# Patient Record
Sex: Female | Born: 1935 | Race: White | Hispanic: No | Smoking: Never smoker
Health system: Southern US, Community
[De-identification: ages and names within clinical notes are randomized; demographics above are authoritative.]

## PROBLEM LIST (undated history)

## (undated) DIAGNOSIS — G43909 Migraine, unspecified, not intractable, without status migrainosus: Secondary | ICD-10-CM

## (undated) DIAGNOSIS — R112 Nausea with vomiting, unspecified: Secondary | ICD-10-CM

## (undated) DIAGNOSIS — T4145XA Adverse effect of unspecified anesthetic, initial encounter: Secondary | ICD-10-CM

## (undated) DIAGNOSIS — M1990 Unspecified osteoarthritis, unspecified site: Secondary | ICD-10-CM

## (undated) DIAGNOSIS — Z87442 Personal history of urinary calculi: Secondary | ICD-10-CM

## (undated) DIAGNOSIS — E7800 Pure hypercholesterolemia, unspecified: Secondary | ICD-10-CM

## (undated) DIAGNOSIS — I10 Essential (primary) hypertension: Secondary | ICD-10-CM

## (undated) DIAGNOSIS — I2510 Atherosclerotic heart disease of native coronary artery without angina pectoris: Secondary | ICD-10-CM

## (undated) DIAGNOSIS — R011 Cardiac murmur, unspecified: Secondary | ICD-10-CM

## (undated) DIAGNOSIS — T8859XA Other complications of anesthesia, initial encounter: Secondary | ICD-10-CM

## (undated) DIAGNOSIS — E785 Hyperlipidemia, unspecified: Secondary | ICD-10-CM

## (undated) DIAGNOSIS — I350 Nonrheumatic aortic (valve) stenosis: Secondary | ICD-10-CM

## (undated) DIAGNOSIS — Z800 Family history of malignant neoplasm of digestive organs: Secondary | ICD-10-CM

## (undated) DIAGNOSIS — Z9889 Other specified postprocedural states: Secondary | ICD-10-CM

## (undated) DIAGNOSIS — Z952 Presence of prosthetic heart valve: Secondary | ICD-10-CM

## (undated) DIAGNOSIS — I480 Paroxysmal atrial fibrillation: Secondary | ICD-10-CM

## (undated) HISTORY — DX: Hyperlipidemia, unspecified: E78.5

## (undated) HISTORY — DX: Family history of malignant neoplasm of digestive organs: Z80.0

## (undated) HISTORY — PX: VARICOSE VEIN SURGERY: SHX832

## (undated) HISTORY — PX: CORONARY ANGIOPLASTY WITH STENT PLACEMENT: SHX49

## (undated) HISTORY — PX: EYE SURGERY: SHX253

---

## 1998-09-23 ENCOUNTER — Ambulatory Visit (HOSPITAL_COMMUNITY): Admission: RE | Admit: 1998-09-23 | Discharge: 1998-09-23 | Payer: Self-pay | Admitting: Internal Medicine

## 1998-09-23 ENCOUNTER — Encounter: Payer: Self-pay | Admitting: Internal Medicine

## 1998-11-08 ENCOUNTER — Other Ambulatory Visit: Admission: RE | Admit: 1998-11-08 | Discharge: 1998-11-08 | Payer: Self-pay | Admitting: Internal Medicine

## 2000-01-30 ENCOUNTER — Other Ambulatory Visit: Admission: RE | Admit: 2000-01-30 | Discharge: 2000-01-30 | Payer: Self-pay | Admitting: Internal Medicine

## 2000-03-02 ENCOUNTER — Ambulatory Visit (HOSPITAL_COMMUNITY): Admission: RE | Admit: 2000-03-02 | Discharge: 2000-03-02 | Payer: Self-pay | Admitting: *Deleted

## 2004-09-15 ENCOUNTER — Encounter (INDEPENDENT_AMBULATORY_CARE_PROVIDER_SITE_OTHER): Payer: Self-pay | Admitting: Specialist

## 2004-09-15 ENCOUNTER — Encounter (INDEPENDENT_AMBULATORY_CARE_PROVIDER_SITE_OTHER): Payer: Self-pay | Admitting: *Deleted

## 2004-09-15 ENCOUNTER — Ambulatory Visit (HOSPITAL_COMMUNITY): Admission: RE | Admit: 2004-09-15 | Discharge: 2004-09-15 | Payer: Self-pay | Admitting: *Deleted

## 2005-06-03 ENCOUNTER — Other Ambulatory Visit: Admission: RE | Admit: 2005-06-03 | Discharge: 2005-06-03 | Payer: Self-pay | Admitting: Internal Medicine

## 2008-01-25 ENCOUNTER — Encounter (INDEPENDENT_AMBULATORY_CARE_PROVIDER_SITE_OTHER): Payer: Self-pay | Admitting: *Deleted

## 2008-01-25 ENCOUNTER — Ambulatory Visit (HOSPITAL_COMMUNITY): Admission: RE | Admit: 2008-01-25 | Discharge: 2008-01-25 | Payer: Self-pay | Admitting: *Deleted

## 2008-06-23 HISTORY — PX: COLONOSCOPY: SHX174

## 2009-03-22 ENCOUNTER — Ambulatory Visit (HOSPITAL_COMMUNITY): Admission: RE | Admit: 2009-03-22 | Discharge: 2009-03-22 | Payer: Self-pay | Admitting: Neurology

## 2009-09-18 ENCOUNTER — Encounter (INDEPENDENT_AMBULATORY_CARE_PROVIDER_SITE_OTHER): Payer: Self-pay | Admitting: *Deleted

## 2009-09-18 ENCOUNTER — Ambulatory Visit: Payer: Self-pay | Admitting: Gastroenterology

## 2009-09-18 DIAGNOSIS — K5900 Constipation, unspecified: Secondary | ICD-10-CM | POA: Insufficient documentation

## 2009-09-18 DIAGNOSIS — K921 Melena: Secondary | ICD-10-CM | POA: Insufficient documentation

## 2009-09-18 DIAGNOSIS — Z860100 Personal history of colon polyps, unspecified: Secondary | ICD-10-CM | POA: Insufficient documentation

## 2009-10-15 ENCOUNTER — Ambulatory Visit: Payer: Self-pay | Admitting: Gastroenterology

## 2009-10-18 ENCOUNTER — Encounter: Payer: Self-pay | Admitting: Gastroenterology

## 2011-03-16 ENCOUNTER — Encounter (HOSPITAL_COMMUNITY): Payer: Medicare Other

## 2011-03-16 ENCOUNTER — Encounter (HOSPITAL_COMMUNITY): Payer: Medicare Other | Attending: Internal Medicine

## 2011-04-14 NOTE — Op Note (Signed)
NAME:  GERRICA, CYGAN NO.:  [id number]   MEDICAL RECORD NO.:  [id number]          PATIENT TYPE:  AMB   LOCATION:  ENDO                         FACILITY:  [facility]   PHYSICIAN:  Georgiana Spinner, M.D.    DATE OF BIRTH:  [birth year]   DATE OF PROCEDURE:  01/25/2008  DATE OF DISCHARGE:                               OPERATIVE REPORT   PROCEDURE:  Colonoscopy.   INDICATIONS:  Colon polyps.   ANESTHESIA:  Fentanyl 80 mcg, Versed 7 mg.   PROCEDURE IN DETAIL:  With the patient mildly sedated in the left  lateral decubitus position, the Pentax videoscopic colonoscope was  inserted into the rectum and passed under direct vision to the cecum  identified by ileocecal valve and appendiceal orifice, both of which  were photographed. From this point, the colonoscope was slowly withdrawn  taking circumferential views of the colonic mucosa stopping in the  rectum which appeared normal on direct and showed hemorrhoids on  retroflexed view. The endoscope was straightened and withdrawn.  The  patient's vital signs and pulse oximeter remained stable.  The patient  tolerated the procedure well without apparent complications.   FINDINGS:  Internal hemorrhoids, otherwise, unremarkable exam.   PLAN:  Repeat examination possibly in five years.           ______________________________  Georgiana Spinner, M.D.     GMO/MEDQ  D:  01/25/2008  T:  01/25/2008  Job:  [id number]

## 2011-04-17 NOTE — Op Note (Signed)
NAME:  Catherine Anthony, Catherine Anthony NO.:  [id number]   MEDICAL RECORD NO.:  [id number]          PATIENT TYPE:  AMB   LOCATION:  ENDO                         FACILITY:  [facility]   PHYSICIAN:  Georgiana Spinner, M.D.    DATE OF BIRTH:  05/25/1930   DATE OF PROCEDURE:  DATE OF DISCHARGE:                                 OPERATIVE REPORT   PROCEDURE:  Colonoscopy.   INDICATIONS:  Colon polyp, colon cancer screening.   ANESTHESIA:  Demerol 70, Versed 7 mg.   PROCEDURE:  With the patient mildly sedated in the left lateral decubitus  position, the Olympus videoscopic colonoscope was inserted into the rectum  and passed under direct vision to the cecum, identified by the ileocecal  valve and the appendiceal orifice, both of which were photographed.  From  this point, the colonoscope was slowly withdrawn, taking circumferential  views of the colonic mucosa, stopping only first in the cecum with one fold  removed from the ileocecal valve, where a small polyp was seen, photographed  and removed, using hot biopsy forceps technique at a setting of 20/20  blended current.  We next stopped in the rectum, which appeared normal on  direct and showed hemorrhoids on retroflexed view.  The endoscope was  straightened and withdrawn.  Patient's vital signs and pulse oximetry  remained stable.  Patient tolerated the procedure well without apparent  complications.   FINDINGS:  She has an anal stricture, mild, on rectal examination, a polyp  of the cecum, internal hemorrhoids, otherwise an unremarkable examination.   PLAN:  Await biopsy report.  Patient will call me for further results and  follow up with me as an outpatient.      GMO/MEDQ  D:  09/15/2004  T:  09/15/2004  Job:  [id number]

## 2011-05-29 ENCOUNTER — Other Ambulatory Visit: Payer: Self-pay

## 2011-05-29 ENCOUNTER — Encounter (HOSPITAL_COMMUNITY)
Admission: RE | Admit: 2011-05-29 | Discharge: 2011-05-29 | Disposition: A | Discharge status: Home / Self Care | Payer: Medicare Other | Source: Ambulatory Visit | Attending: Ophthalmology | Admitting: Ophthalmology

## 2011-05-29 LAB — HEMOGLOBIN AND HEMATOCRIT, BLOOD
HCT: 39.2 % (ref 36.0–46.0)
Hemoglobin: 13.8 g/dL (ref 12.0–15.0)

## 2011-06-04 ENCOUNTER — Ambulatory Visit (HOSPITAL_COMMUNITY)
Admission: RE | Admit: 2011-06-04 | Discharge: 2011-06-04 | Disposition: A | Discharge status: Home / Self Care | Payer: Medicare Other | Source: Ambulatory Visit | Attending: Ophthalmology | Admitting: Ophthalmology

## 2011-06-04 DIAGNOSIS — Z01810 Encounter for preprocedural cardiovascular examination: Secondary | ICD-10-CM | POA: Insufficient documentation

## 2011-06-04 DIAGNOSIS — Z01812 Encounter for preprocedural laboratory examination: Secondary | ICD-10-CM | POA: Insufficient documentation

## 2011-06-04 DIAGNOSIS — I10 Essential (primary) hypertension: Secondary | ICD-10-CM | POA: Insufficient documentation

## 2011-06-04 DIAGNOSIS — H2510 Age-related nuclear cataract, unspecified eye: Secondary | ICD-10-CM | POA: Insufficient documentation

## 2011-06-04 DIAGNOSIS — Z79899 Other long term (current) drug therapy: Secondary | ICD-10-CM | POA: Insufficient documentation

## 2011-06-04 HISTORY — PX: CATARACT EXTRACTION W/ INTRAOCULAR LENS IMPLANT: SHX1309

## 2011-06-04 LAB — POCT I-STAT 4, (NA,K, GLUC, HGB,HCT)
Glucose, Bld: 91 mg/dL (ref 70–99)
HCT: 41 % (ref 36.0–46.0)
Hemoglobin: 13.9 g/dL (ref 12.0–15.0)
Potassium: 4.4 mEq/L (ref 3.5–5.1)
Sodium: 136 mEq/L (ref 135–145)

## 2011-06-08 NOTE — Op Note (Signed)
  NAME:  Catherine Anthony, Catherine Anthony NO.:  [id number]  MEDICAL RECORD NO.:  [id number]  LOCATION:  DAYP                          FACILITY:  [facility]  PHYSICIAN:  Susanne Greenhouse, MD       DATE OF BIRTH:  05-18-1930  DATE OF PROCEDURE:  06/04/2011 DATE OF DISCHARGE:                              OPERATIVE REPORT   PREOPERATIVE DIAGNOSIS:  Nuclear cataract, right eye.  POSTOPERATIVE DIAGNOSIS:  Nuclear cataract, right eye.  DIAGNOSIS CODE:  366.16.  OPERATION PERFORMED:  Phacoemulsification with posterior chamber intraocular lens implantation, right eye.  SURGEON:  Bonne Dolores. Khandi Kernes, MD  ANESTHESIA:  General endotracheal anesthesia.  OPERATIVE SUMMARY:  In the preoperative area, dilating drops were placed into the right eye.  The patient was then brought into the operating room where she was placed under general anesthesia.  The eye was then prepped and draped.  Beginning with a 75 blade, a paracentesis port was made at the surgeon's 2 o'clock position.  The anterior chamber was then filled with a 1% nonpreserved lidocaine solution with epinephrine.  This was followed by Viscoat to deepen the chamber.  A small fornix-based peritomy was performed superiorly.  Next, a single iris hook was placed through the limbus superiorly.  A 2.4-mm keratome blade was then used to make a clear corneal incision over the iris hook.  A bent cystotome needle and Utrata forceps were used to create a continuous tear capsulotomy.  Hydrodissection was performed using balanced salt solution on a fine cannula.  The lens nucleus was then removed using phacoemulsification in a quadrant cracking technique.  The cortical material was then removed with irrigation and aspiration.  The capsular bag and anterior chamber were refilled with Provisc.  The wound was widened to approximately 3 mm and a posterior chamber intraocular lens was placed into the capsular bag without difficulty using an [employer] lens  injecting system.  A single 10-0 nylon suture was then used to close the incision as well as stromal hydration.  The Provisc was removed from the anterior chamber and capsular bag with irrigation and aspiration.  At this point, the wounds were tested for leak, which were negative.  The anterior chamber remained deep and stable.  The patient tolerated the procedure well.  There were no operative complications, and she awoke from general anesthesia without problem.  No surgical specimens.  Prosthetic device used is a Lenstec posterior chamber lens, model Softec HD, power of 21.5, serial number is [id number].          ______________________________ Susanne Greenhouse, MD     KEH/MEDQ  D:  06/04/2011  T:  06/04/2011  Job:  [id number]  Electronically Signed by Gemma Payor MD on 06/08/2011 12:44:43 PM

## 2011-06-16 ENCOUNTER — Encounter (HOSPITAL_COMMUNITY): Payer: Self-pay | Admitting: *Deleted

## 2011-06-16 ENCOUNTER — Encounter (HOSPITAL_COMMUNITY): Admission: RE | Admit: 2011-06-16 | Discharge: 2011-06-16 | Payer: Medicare Other | Source: Ambulatory Visit

## 2011-06-16 HISTORY — DX: Essential (primary) hypertension: I10

## 2011-06-16 MED ORDER — CYCLOPENTOLATE-PHENYLEPHRINE 0.2-1 % OP SOLN: 1.0000 [drp] | OPHTHALMIC | Status: DC

## 2011-06-16 MED ORDER — TETRACAINE HCL 0.5 % OP SOLN: 1.0000 [drp] | OPHTHALMIC | Status: DC

## 2011-06-16 MED ORDER — LIDOCAINE HCL 3.5 % OP GEL: 1.0000 "application " | Freq: Once | OPHTHALMIC | Status: DC

## 2011-06-16 MED ORDER — PHENYLEPHRINE HCL 2.5 % OP SOLN: 1.0000 [drp] | OPHTHALMIC | Status: DC

## 2011-06-19 ENCOUNTER — Encounter (HOSPITAL_COMMUNITY): Payer: Self-pay

## 2011-06-22 ENCOUNTER — Ambulatory Visit (HOSPITAL_COMMUNITY): Payer: Medicare Other | Admitting: Anesthesiology

## 2011-06-22 ENCOUNTER — Ambulatory Visit (HOSPITAL_COMMUNITY)
Admission: RE | Admit: 2011-06-22 | Discharge: 2011-06-22 | Disposition: A | Discharge status: Home / Self Care | Payer: Medicare Other | Source: Ambulatory Visit | Attending: Ophthalmology | Admitting: Ophthalmology

## 2011-06-22 ENCOUNTER — Encounter (HOSPITAL_COMMUNITY): Payer: Self-pay | Admitting: *Deleted

## 2011-06-22 ENCOUNTER — Encounter (HOSPITAL_COMMUNITY): Payer: Self-pay | Admitting: Anesthesiology

## 2011-06-22 ENCOUNTER — Encounter (HOSPITAL_COMMUNITY)
Admission: RE | Disposition: A | Discharge status: Home / Self Care | Payer: Self-pay | Source: Ambulatory Visit | Attending: Ophthalmology

## 2011-06-22 DIAGNOSIS — I10 Essential (primary) hypertension: Secondary | ICD-10-CM | POA: Insufficient documentation

## 2011-06-22 DIAGNOSIS — Z79899 Other long term (current) drug therapy: Secondary | ICD-10-CM | POA: Insufficient documentation

## 2011-06-22 DIAGNOSIS — H2510 Age-related nuclear cataract, unspecified eye: Secondary | ICD-10-CM | POA: Insufficient documentation

## 2011-06-22 HISTORY — PX: CATARACT EXTRACTION W/PHACO: SHX586

## 2011-06-22 SURGERY — PHACOEMULSIFICATION, CATARACT, WITH IOL INSERTION
Anesthesia: Monitor Anesthesia Care | Site: Eye | Laterality: Left | Wound class: Clean

## 2011-06-22 MED ORDER — CYCLOPENTOLATE-PHENYLEPHRINE 0.2-1 % OP SOLN
OPHTHALMIC | Status: AC
  Administered 2011-06-22: 1 [drp] via OPHTHALMIC
  Filled 2011-06-22: qty 2

## 2011-06-22 MED ORDER — LACTATED RINGERS IV SOLN
INTRAVENOUS | Status: DC
  Administered 2011-06-22: 11:00:00 via INTRAVENOUS

## 2011-06-22 MED ORDER — PROVISC 10 MG/ML IO SOLN
INTRAOCULAR | Status: DC | PRN
  Administered 2011-06-22: 8.5 mg via OPHTHALMIC

## 2011-06-22 MED ORDER — POVIDONE-IODINE 5 % OP SOLN
OPHTHALMIC | Status: DC | PRN
  Administered 2011-06-22: 1 via OPHTHALMIC

## 2011-06-22 MED ORDER — LIDOCAINE HCL 3.5 % OP GEL
OPHTHALMIC | Status: AC
  Administered 2011-06-22: 1 via OPHTHALMIC
  Filled 2011-06-22: qty 5

## 2011-06-22 MED ORDER — NEOMYCIN-POLYMYXIN-DEXAMETH 0.1 % OP OINT
TOPICAL_OINTMENT | OPHTHALMIC | Status: DC | PRN
  Administered 2011-06-22: 1 via OPHTHALMIC

## 2011-06-22 MED ORDER — CYCLOPENTOLATE-PHENYLEPHRINE 0.2-1 % OP SOLN
1.0000 [drp] | OPHTHALMIC | Status: AC
  Administered 2011-06-22 (×3): 1 [drp] via OPHTHALMIC

## 2011-06-22 MED ORDER — MIDAZOLAM HCL 2 MG/2ML IJ SOLN
1.0000 mg | INTRAMUSCULAR | Status: DC | PRN
  Administered 2011-06-22: 2 mg via INTRAVENOUS

## 2011-06-22 MED ORDER — LIDOCAINE HCL (PF) 1 % IJ SOLN
INTRAMUSCULAR | Status: AC
  Filled 2011-06-22: qty 2

## 2011-06-22 MED ORDER — PHENYLEPHRINE HCL 2.5 % OP SOLN
OPHTHALMIC | Status: AC
  Administered 2011-06-22: 1 [drp] via OPHTHALMIC
  Filled 2011-06-22: qty 2

## 2011-06-22 MED ORDER — NEOMYCIN-POLYMYXIN-DEXAMETH 3.5-10000-0.1 OP OINT
TOPICAL_OINTMENT | OPHTHALMIC | Status: AC
  Filled 2011-06-22: qty 3.5

## 2011-06-22 MED ORDER — PHENYLEPHRINE HCL 2.5 % OP SOLN
1.0000 [drp] | OPHTHALMIC | Status: AC
  Administered 2011-06-22 (×3): 1 [drp] via OPHTHALMIC

## 2011-06-22 MED ORDER — TETRACAINE HCL 0.5 % OP SOLN
OPHTHALMIC | Status: AC
  Administered 2011-06-22: 1 [drp] via OPHTHALMIC
  Filled 2011-06-22: qty 2

## 2011-06-22 MED ORDER — EPINEPHRINE HCL 1 MG/ML IJ SOLN
INTRAOCULAR | Status: DC | PRN
  Administered 2011-06-22: 12:00:00

## 2011-06-22 MED ORDER — EPINEPHRINE HCL 1 MG/ML IJ SOLN
INTRAMUSCULAR | Status: AC
  Filled 2011-06-22: qty 1

## 2011-06-22 MED ORDER — LIDOCAINE HCL 3.5 % OP GEL
OPHTHALMIC | Status: DC | PRN
  Administered 2011-06-22: 1 via OPHTHALMIC

## 2011-06-22 MED ORDER — MIDAZOLAM HCL 2 MG/2ML IJ SOLN
INTRAMUSCULAR | Status: AC
  Administered 2011-06-22: 2 mg via INTRAVENOUS
  Filled 2011-06-22: qty 2

## 2011-06-22 MED ORDER — TETRACAINE HCL 0.5 % OP SOLN
1.0000 [drp] | OPHTHALMIC | Status: AC
  Administered 2011-06-22 (×3): 1 [drp] via OPHTHALMIC

## 2011-06-22 MED ORDER — BSS IO SOLN
INTRAOCULAR | Status: DC | PRN
  Administered 2011-06-22: 15 mL via OPHTHALMIC

## 2011-06-22 MED ORDER — LACTATED RINGERS IV SOLN
INTRAVENOUS | Status: DC | PRN
  Administered 2011-06-22: 11:00:00 via INTRAVENOUS

## 2011-06-22 MED ORDER — LIDOCAINE HCL 3.5 % OP GEL
1.0000 "application " | Freq: Once | OPHTHALMIC | Status: AC
  Administered 2011-06-22: 1 via OPHTHALMIC

## 2011-06-22 MED ORDER — LIDOCAINE HCL (PF) 1 % IJ SOLN
INTRAMUSCULAR | Status: DC | PRN
  Administered 2011-06-22: .3 mL

## 2011-06-22 SURGICAL SUPPLY — 32 items
CAPSULAR TENSION RING-AMO (OPHTHALMIC RELATED) IMPLANT
CLOTH BEACON ORANGE TIMEOUT ST (SAFETY) ×1 IMPLANT
DUOVISC SYSTEM (INTRAOCULAR LENS)
EYE SHIELD UNIVERSAL CLEAR (GAUZE/BANDAGES/DRESSINGS) ×1 IMPLANT
GLOVE BIO SURGEON STRL SZ 6.5 (GLOVE) IMPLANT
GLOVE BIOGEL PI IND STRL 6.5 (GLOVE) IMPLANT
GLOVE BIOGEL PI IND STRL 7.0 (GLOVE) IMPLANT
GLOVE BIOGEL PI IND STRL 7.5 (GLOVE) IMPLANT
GLOVE BIOGEL PI INDICATOR 6.5 (GLOVE) ×1
GLOVE BIOGEL PI INDICATOR 7.0 (GLOVE)
GLOVE BIOGEL PI INDICATOR 7.5 (GLOVE)
GLOVE ECLIPSE 6.5 STRL STRAW (GLOVE) IMPLANT
GLOVE ECLIPSE 7.0 STRL STRAW (GLOVE) IMPLANT
GLOVE ECLIPSE 7.5 STRL STRAW (GLOVE) IMPLANT
GLOVE EXAM NITRILE LRG STRL (GLOVE) IMPLANT
GLOVE EXAM NITRILE MD LF STRL (GLOVE) ×1 IMPLANT
GLOVE SKINSENSE NS SZ6.5 (GLOVE)
GLOVE SKINSENSE NS SZ7.0 (GLOVE)
GLOVE SKINSENSE STRL SZ6.5 (GLOVE) IMPLANT
GLOVE SKINSENSE STRL SZ7.0 (GLOVE) IMPLANT
KIT VITRECTOMY (OPHTHALMIC RELATED) IMPLANT
PAD ARMBOARD 7.5X6 YLW CONV (MISCELLANEOUS) ×1 IMPLANT
PROC W NO LENS (INTRAOCULAR LENS)
PROC W SPEC LENS (INTRAOCULAR LENS)
PROCESS W NO LENS (INTRAOCULAR LENS) IMPLANT
PROCESS W SPEC LENS (INTRAOCULAR LENS) IMPLANT
RING MALYGIN (MISCELLANEOUS) IMPLANT
SIGHTPATH CAT PROC W REG LENS (Ophthalmic Related) ×2 IMPLANT
SYR TB 1ML LL NO SAFETY (SYRINGE) ×1 IMPLANT
SYSTEM DUOVISC (INTRAOCULAR LENS) IMPLANT
VISCOELASTIC ADDITIONAL (OPHTHALMIC RELATED) ×1 IMPLANT
WATER STERILE IRR 250ML POUR (IV SOLUTION) ×1 IMPLANT

## 2011-06-22 NOTE — H&P (Signed)
I have evaluated the patient preoperatively, and have identified no interval changes in medical condition and plan of care since the history and physical of record 

## 2011-06-22 NOTE — Anesthesia Postprocedure Evaluation (Signed)
  Anesthesia Post-op Note  Patient: Catherine Anthony  Procedure(s) Performed:  CATARACT EXTRACTION PHACO AND INTRAOCULAR LENS PLACEMENT (IOC) - CDE 14.34  Patient Location: PACU  Anesthesia Type: MAC  Level of Consciousness: awake, alert  and oriented  Airway and Oxygen Therapy: Patient Spontanous Breathing  Post-op Pain: none  Post-op Assessment: Post-op Vital signs reviewed, Patient's Cardiovascular Status Stable and Respiratory Function Stable  Post-op Vital Signs: stable  Complications: No apparent anesthesia complications

## 2011-06-22 NOTE — Anesthesia Preprocedure Evaluation (Addendum)
Anesthesia Evaluation  Name, MR# and DOB Patient awake  General Assessment Comment  Reviewed: Allergy & Precautions, H&P  and Patient's Chart, lab work & pertinent test results  Airway Mallampati: II      Dental  (+) Teeth Intact   Pulmonary    pulmonary exam normal   Cardiovascular hypertension, Pt. on medications and Pt. on home beta blockers Regular Normal   Neuro/Psych  GI/Hepatic/Renal   Endo/Other   Abdominal   Musculoskeletal  Hematology   Peds  Reproductive/Obstetrics   Anesthesia Other Findings             Anesthesia Physical Anesthesia Plan  ASA: II  Anesthesia Plan: MAC   Post-op Pain Management:    Induction:   Airway Management Planned: Nasal Cannula  Additional Equipment:   Intra-op Plan:   Post-operative Plan:   Informed Consent: I have reviewed the patients History and Physical, chart, labs and discussed the procedure including the risks, benefits and alternatives for the proposed anesthesia with the patient or authorized representative who has indicated his/her understanding and acceptance.     Plan Discussed with:   Anesthesia Plan Comments:         Anesthesia Quick Evaluation

## 2011-06-22 NOTE — Transfer of Care (Signed)
Immediate Anesthesia Transfer of Care Note  Patient: Catherine Anthony  Procedure(s) Performed:  CATARACT EXTRACTION PHACO AND INTRAOCULAR LENS PLACEMENT (IOC) - CDE 14.34  Patient Location: PACU  Anesthesia Type: MAC  Level of Consciousness: awake, alert  and oriented  Airway & Oxygen Therapy: Patient Spontanous Breathing  Post-op Assessment: Report given to PACU RN  Post vital signs: stable  Complications: No apparent anesthesia complications

## 2011-06-22 NOTE — Brief Op Note (Signed)
06/22/2011  12:21 PM  PATIENT:  Catherine Anthony  76 y.o. female  PRE-OPERATIVE DIAGNOSIS:  nuclear cataract left eye  POST-OPERATIVE DIAGNOSIS:  nuclear cataract left eye  PROCEDURE:  Procedure(s): CATARACT EXTRACTION PHACO AND INTRAOCULAR LENS PLACEMENT (IOC)  SURGEON:  Surgeon(s): Gemma Payor   ANESTHESIA:   local and IV sedation

## 2011-06-23 NOTE — Op Note (Signed)
NAME:  Catherine Anthony, Catherine Anthony NO.:  [id number]  MEDICAL RECORD NO.:  [id number]  LOCATION:  [facility]                          FACILITY:  [facility]  PHYSICIAN:  Susanne Greenhouse, MD       DATE OF BIRTH:  1930/02/28  DATE OF PROCEDURE:  06/22/2011 DATE OF DISCHARGE:  06/22/2011                              OPERATIVE REPORT   PREOPERATIVE DIAGNOSIS:  Nuclear cataract, left eye, diagnosis code 366.16.  POSTOPERATIVE DIAGNOSIS:  Nuclear cataract, left eye, diagnosis code 366.16.  OPERATION PERFORMED:  Phacoemulsification with posterior chamber intraocular lens implantation, left eye.  SURGEON:  Susanne Greenhouse, MD  ANESTHESIA:  General endotracheal anesthesia.  DESCRIPTION OF OPERATION:  In the preoperative area, dilating drops were placed into the left eye.  The patient was then brought into the operating room where she was placed under general anesthesia.  The eye was then prepped and draped.  Beginning with a 75-blade, a paracentesis port was made at the surgeon's 2 o'clock position.  The anterior chamber was then filled with a 1% nonpreserved lidocaine solution with epinephrine.  This was followed by Viscoat to deepen the chamber.  A small fornix-based peritomy was performed superiorly.  Next, a single iris hook was placed through the limbus superiorly.  A 2.4-mm keratome blade was then used to make a clear corneal incision over the iris hook. A bent cystotome needle and Utrata forceps were used to create a continuous tear capsulotomy.  Hydrodissection was performed using balanced salt solution on a fine cannula.  The lens nucleus was then removed using phacoemulsification in a quadrant cracking technique.  The cortical material was then removed with irrigation and aspiration.  The capsular bag and anterior chamber were refilled with Provisc.  The wound was widened to approximately 3 mm and a posterior chamber intraocular lens was placed into the capsular bag without difficulty  using an [employer] lens injecting system.  A single 10-0 nylon suture was then used to close the incision as well as stromal hydration.  The Provisc was removed from the anterior chamber and capsular bag with irrigation and aspiration.  At this point, the wounds were tested for leak, which were negative.  The anterior chamber remained deep and stable.  The patient tolerated the procedure well.  There were no operative complications, and she awoke from general anesthesia without problem.  No surgical specimens.  Prosthetic device used is a Lenstec posterior chamber lens, model is Softec HD, power of 22.5, serial number is [id number].          ______________________________ Susanne Greenhouse, MD     KEH/MEDQ  D:  06/22/2011  T:  06/23/2011  Job:  [id number]

## 2011-07-13 ENCOUNTER — Encounter (HOSPITAL_COMMUNITY): Payer: Self-pay | Admitting: Ophthalmology

## 2012-09-28 ENCOUNTER — Encounter: Payer: Self-pay | Admitting: Internal Medicine

## 2012-11-28 DIAGNOSIS — Z0000 Encounter for general adult medical examination without abnormal findings: Secondary | ICD-10-CM | POA: Diagnosis not present

## 2012-11-28 DIAGNOSIS — Z79899 Other long term (current) drug therapy: Secondary | ICD-10-CM | POA: Diagnosis not present

## 2012-12-01 DIAGNOSIS — M545 Low back pain: Secondary | ICD-10-CM | POA: Diagnosis not present

## 2012-12-01 DIAGNOSIS — I10 Essential (primary) hypertension: Secondary | ICD-10-CM | POA: Diagnosis not present

## 2012-12-01 DIAGNOSIS — Z1212 Encounter for screening for malignant neoplasm of rectum: Secondary | ICD-10-CM | POA: Diagnosis not present

## 2012-12-01 DIAGNOSIS — M810 Age-related osteoporosis without current pathological fracture: Secondary | ICD-10-CM | POA: Diagnosis not present

## 2012-12-01 DIAGNOSIS — Z0000 Encounter for general adult medical examination without abnormal findings: Secondary | ICD-10-CM | POA: Diagnosis not present

## 2013-05-02 DIAGNOSIS — M810 Age-related osteoporosis without current pathological fracture: Secondary | ICD-10-CM | POA: Diagnosis not present

## 2013-05-02 DIAGNOSIS — IMO0002 Reserved for concepts with insufficient information to code with codable children: Secondary | ICD-10-CM | POA: Diagnosis not present

## 2013-05-15 DIAGNOSIS — M810 Age-related osteoporosis without current pathological fracture: Secondary | ICD-10-CM | POA: Diagnosis not present

## 2013-06-09 ENCOUNTER — Encounter: Payer: Self-pay | Admitting: Internal Medicine

## 2013-06-21 DIAGNOSIS — M25559 Pain in unspecified hip: Secondary | ICD-10-CM | POA: Diagnosis not present

## 2013-06-21 DIAGNOSIS — IMO0002 Reserved for concepts with insufficient information to code with codable children: Secondary | ICD-10-CM | POA: Diagnosis not present

## 2013-07-05 DIAGNOSIS — H52229 Regular astigmatism, unspecified eye: Secondary | ICD-10-CM | POA: Diagnosis not present

## 2013-07-05 DIAGNOSIS — H5210 Myopia, unspecified eye: Secondary | ICD-10-CM | POA: Diagnosis not present

## 2013-07-05 DIAGNOSIS — H524 Presbyopia: Secondary | ICD-10-CM | POA: Diagnosis not present

## 2013-07-05 DIAGNOSIS — Z961 Presence of intraocular lens: Secondary | ICD-10-CM | POA: Diagnosis not present

## 2013-08-17 DIAGNOSIS — H26499 Other secondary cataract, unspecified eye: Secondary | ICD-10-CM | POA: Diagnosis not present

## 2013-10-17 DIAGNOSIS — M5137 Other intervertebral disc degeneration, lumbosacral region: Secondary | ICD-10-CM | POA: Diagnosis not present

## 2013-10-17 DIAGNOSIS — M999 Biomechanical lesion, unspecified: Secondary | ICD-10-CM | POA: Diagnosis not present

## 2013-10-17 DIAGNOSIS — M5430 Sciatica, unspecified side: Secondary | ICD-10-CM | POA: Diagnosis not present

## 2013-10-19 DIAGNOSIS — M5430 Sciatica, unspecified side: Secondary | ICD-10-CM | POA: Diagnosis not present

## 2013-10-19 DIAGNOSIS — M999 Biomechanical lesion, unspecified: Secondary | ICD-10-CM | POA: Diagnosis not present

## 2013-10-19 DIAGNOSIS — M5137 Other intervertebral disc degeneration, lumbosacral region: Secondary | ICD-10-CM | POA: Diagnosis not present

## 2013-12-01 DIAGNOSIS — E7800 Pure hypercholesterolemia, unspecified: Secondary | ICD-10-CM | POA: Diagnosis not present

## 2013-12-01 DIAGNOSIS — Z0000 Encounter for general adult medical examination without abnormal findings: Secondary | ICD-10-CM | POA: Diagnosis not present

## 2013-12-01 DIAGNOSIS — I10 Essential (primary) hypertension: Secondary | ICD-10-CM | POA: Diagnosis not present

## 2013-12-01 DIAGNOSIS — M810 Age-related osteoporosis without current pathological fracture: Secondary | ICD-10-CM | POA: Diagnosis not present

## 2013-12-01 DIAGNOSIS — R319 Hematuria, unspecified: Secondary | ICD-10-CM | POA: Diagnosis not present

## 2013-12-11 DIAGNOSIS — K5900 Constipation, unspecified: Secondary | ICD-10-CM | POA: Diagnosis not present

## 2013-12-11 DIAGNOSIS — Z1212 Encounter for screening for malignant neoplasm of rectum: Secondary | ICD-10-CM | POA: Diagnosis not present

## 2013-12-11 DIAGNOSIS — I10 Essential (primary) hypertension: Secondary | ICD-10-CM | POA: Diagnosis not present

## 2013-12-11 DIAGNOSIS — M810 Age-related osteoporosis without current pathological fracture: Secondary | ICD-10-CM | POA: Diagnosis not present

## 2013-12-11 DIAGNOSIS — L570 Actinic keratosis: Secondary | ICD-10-CM | POA: Diagnosis not present

## 2013-12-11 DIAGNOSIS — Z8619 Personal history of other infectious and parasitic diseases: Secondary | ICD-10-CM | POA: Diagnosis not present

## 2013-12-20 ENCOUNTER — Encounter (INDEPENDENT_AMBULATORY_CARE_PROVIDER_SITE_OTHER): Payer: Self-pay | Admitting: *Deleted

## 2014-01-03 ENCOUNTER — Telehealth (INDEPENDENT_AMBULATORY_CARE_PROVIDER_SITE_OTHER): Payer: Self-pay | Admitting: *Deleted

## 2014-01-03 ENCOUNTER — Other Ambulatory Visit (INDEPENDENT_AMBULATORY_CARE_PROVIDER_SITE_OTHER): Payer: Self-pay | Admitting: *Deleted

## 2014-01-03 ENCOUNTER — Encounter (INDEPENDENT_AMBULATORY_CARE_PROVIDER_SITE_OTHER): Payer: Self-pay | Admitting: Internal Medicine

## 2014-01-03 ENCOUNTER — Ambulatory Visit (INDEPENDENT_AMBULATORY_CARE_PROVIDER_SITE_OTHER): Payer: Medicare Other | Admitting: Internal Medicine

## 2014-01-03 VITALS — BP 180/80 | HR 76 | Temp 98.5°F | Ht 64.0 in | Wt 121.2 lb

## 2014-01-03 DIAGNOSIS — K5900 Constipation, unspecified: Secondary | ICD-10-CM

## 2014-01-03 DIAGNOSIS — Z1211 Encounter for screening for malignant neoplasm of colon: Secondary | ICD-10-CM

## 2014-01-03 MED ORDER — PEG 3350-KCL-NA BICARB-NACL 420 G PO SOLR: 4000.0000 mL | Freq: Once | ORAL | Status: DC

## 2014-01-03 NOTE — Telephone Encounter (Signed)
Patient needs movi prep 

## 2014-01-03 NOTE — Patient Instructions (Addendum)
Colonoscopy. The risks and benefits such as perforation, bleeding, and infection were reviewed with the patient and is agreeable. Fiber tabs 2 daily.  Take 1/2 of the Miralax a day and see if your stools firm up some.

## 2014-01-03 NOTE — Progress Notes (Signed)
Subjective:     Patient ID: Catherine Anthony, female   DOB: 01/22/1930, 80 y.o.   MRN: [id number]  HPI  [facility] our [facility] . She tells me back in the stomach, she messed her back and hip up working in the yard. [place]. After that, she could not have a BM unless she took two Ducalax. She is presently taking Miralax for her constipation. She is having a BM 1-2 in the am. She has to strain to have a BM. Stools are loose. She does not take fiber. Appetite is good. No weight loss. No melena or bright red rectal bleeding.  She is not exercising.   Her last colonoscopy was in 2010 by Dr. Russella DarStark.  PROCEDURE DATE: 10/15/2009  PROCEDURE: Colonoscopy with snare polypectomy, Colon w/ inj  sclerosis of hemorrhoids  ASA CLASS: Class II  INDICATIONS: 1) hematochezia 2) follow-up of polyp 3) family  history of colon cancer, adenomatous colon polyps, 08/2004. Sister  with colon cancer at age 78.    Review of Systems see hpi Current Outpatient Prescriptions  Medication Sig Dispense Refill  . amLODipine (NORVASC) 5 MG tablet Take 2.5 mg by mouth daily.        Marland Kitchen. atenolol (TENORMIN) 100 MG tablet Take 100 mg by mouth daily.        . calcium citrate (CALCITRATE - DOSED IN MG ELEMENTAL CALCIUM) 950 MG tablet Take 200 mg of elemental calcium by mouth daily.      Marland Kitchen. lisinopril (PRINIVIL,ZESTRIL) 20 MG tablet Take 20 mg by mouth daily.        . naproxen sodium (ANAPROX) 220 MG tablet Take 220 mg by mouth 2 (two) times daily with a meal.      . polyethylene glycol (MIRALAX / GLYCOLAX) packet Take 17 g by mouth daily.       No current facility-administered medications for this visit.   Past Medical History  Diagnosis Date  . Hypertension   . Family hx of colon cancer    Past Surgical History  Procedure Laterality Date  . Varicose vein surgery      [facility]  . Colonoscopy  06/23/08  . Cataract extraction w/ intraocular lens implant   06/04/11    right, [facility]  . Cataract extraction w/phaco  06/22/2011    Procedure: CATARACT EXTRACTION PHACO AND INTRAOCULAR LENS PLACEMENT (IOC);  Surgeon: Gemma PayorKerry Hunt;  Location: [facility];  Service: Ophthalmology;  Laterality: Left;  CDE 14.34   No Known Allergies  Widowed. Three children. One child deceased at birth.      Objective:   Physical Exam  Filed Vitals:   01/03/14 1518  BP: 180/80  Pulse: 76  Temp: 98.5 F (36.9 C)  Height: 5\' 4"  (1.626 m)  Weight: 121 lb 3.2 oz (54.976 kg)  Alert and oriented. Skin warm and dry. Oral mucosa is moist.   . Sclera anicteric, conjunctivae is pink. Thyroid not enlarged. No cervical lymphadenopathy. Lungs clear. Heart regular rate and rhythm.  Abdomen is soft. Bowel sounds are positive. No hepatomegaly. No abdominal masses felt. No tenderness.  No edema to lower extremities.       Assessment:    Constipation. Presently taking Miralax. Family hx of colon cancer in a sister age 78.    Plan:     Constipation. Presently taking Miralax. Fiber 4 gam daily.  Colonoscopy for family hx of colon cancer and hx of adenomas. Take 1/2 the Miralax daily.

## 2014-01-04 ENCOUNTER — Encounter (HOSPITAL_COMMUNITY): Payer: Self-pay | Admitting: Pharmacy Technician

## 2014-01-18 ENCOUNTER — Ambulatory Visit (HOSPITAL_COMMUNITY)
Admission: RE | Admit: 2014-01-18 | Discharge: 2014-01-18 | Disposition: A | Discharge status: Home / Self Care | Payer: Medicare Other | Source: Ambulatory Visit | Attending: Internal Medicine | Admitting: Internal Medicine

## 2014-01-18 ENCOUNTER — Encounter (HOSPITAL_COMMUNITY)
Admission: RE | Disposition: A | Discharge status: Home / Self Care | Payer: Self-pay | Source: Ambulatory Visit | Attending: Internal Medicine

## 2014-01-18 ENCOUNTER — Encounter (HOSPITAL_COMMUNITY): Payer: Self-pay | Admitting: *Deleted

## 2014-01-18 DIAGNOSIS — I10 Essential (primary) hypertension: Secondary | ICD-10-CM | POA: Diagnosis not present

## 2014-01-18 DIAGNOSIS — K644 Residual hemorrhoidal skin tags: Secondary | ICD-10-CM | POA: Diagnosis not present

## 2014-01-18 DIAGNOSIS — Z860100 Personal history of colon polyps, unspecified: Secondary | ICD-10-CM | POA: Diagnosis not present

## 2014-01-18 DIAGNOSIS — Z09 Encounter for follow-up examination after completed treatment for conditions other than malignant neoplasm: Secondary | ICD-10-CM | POA: Insufficient documentation

## 2014-01-18 DIAGNOSIS — K5900 Constipation, unspecified: Secondary | ICD-10-CM

## 2014-01-18 DIAGNOSIS — K5730 Diverticulosis of large intestine without perforation or abscess without bleeding: Secondary | ICD-10-CM | POA: Diagnosis not present

## 2014-01-18 DIAGNOSIS — Z800 Family history of malignant neoplasm of digestive organs: Secondary | ICD-10-CM

## 2014-01-18 DIAGNOSIS — K624 Stenosis of anus and rectum: Secondary | ICD-10-CM | POA: Insufficient documentation

## 2014-01-18 HISTORY — PX: COLONOSCOPY: SHX5424

## 2014-01-18 SURGERY — COLONOSCOPY
Anesthesia: Moderate Sedation

## 2014-01-18 MED ORDER — MIDAZOLAM HCL 5 MG/5ML IJ SOLN
INTRAMUSCULAR | Status: AC
  Filled 2014-01-18: qty 10

## 2014-01-18 MED ORDER — MEPERIDINE HCL 50 MG/ML IJ SOLN
INTRAMUSCULAR | Status: DC | PRN
  Administered 2014-01-18: 25 mg

## 2014-01-18 MED ORDER — STERILE WATER FOR IRRIGATION IR SOLN
Status: DC | PRN
  Administered 2014-01-18: 14:00:00

## 2014-01-18 MED ORDER — MEPERIDINE HCL 50 MG/ML IJ SOLN
INTRAMUSCULAR | Status: AC
  Filled 2014-01-18: qty 1

## 2014-01-18 MED ORDER — POLYETHYLENE GLYCOL 3350 17 GM/SCOOP PO POWD
1.0000 | Freq: Once | ORAL | Status: DC
Start: 2014-01-18 — End: 2017-08-26

## 2014-01-18 MED ORDER — MIDAZOLAM HCL 5 MG/5ML IJ SOLN
INTRAMUSCULAR | Status: DC | PRN
  Administered 2014-01-18: 1 mg via INTRAVENOUS
  Administered 2014-01-18: 2 mg via INTRAVENOUS

## 2014-01-18 MED ORDER — SODIUM CHLORIDE 0.9 % IV SOLN
INTRAVENOUS | Status: DC
  Administered 2014-01-18: 1000 mL via INTRAVENOUS

## 2014-01-18 NOTE — H&P (Signed)
Catherine Anthony is an 80 y.o. female.   Chief Complaint: Patient is here for colonoscopy. HPI: Patient is a 89-year-old Caucasian female who has history of colonic adenomas and is here for surveillance colonoscopy. Her last exam was in 2010 and 1 prior to that was in 2005. She denies rectal bleeding abdominal pain or change in her bowel habits. History significant for colon carcinoma in her sister was in her 5360s at the time of diagnosis.  Past Medical History  Diagnosis Date  . Hypertension   . Family hx of colon cancer     Past Surgical History  Procedure Laterality Date  . Varicose vein surgery      [facility]  . Colonoscopy  06/23/08  . Cataract extraction w/ intraocular lens implant  06/04/11    right, [facility]  . Cataract extraction w/phaco  06/22/2011    Procedure: CATARACT EXTRACTION PHACO AND INTRAOCULAR LENS PLACEMENT (IOC);  Surgeon: Gemma PayorKerry Hunt;  Location: [facility];  Service: Ophthalmology;  Laterality: Left;  CDE 14.34    History reviewed. No pertinent family history. Social History:  reports that she has never smoked. She does not have any smokeless tobacco history on file. She reports that she does not drink alcohol or use illicit drugs.  Allergies:  Allergies  Allergen Reactions  . Codeine Nausea And Vomiting    Medications Prior to Admission  Medication Sig Dispense Refill  . amLODipine (NORVASC) 5 MG tablet Take 2.5 mg by mouth daily.        Marland Kitchen. atenolol (TENORMIN) 100 MG tablet Take 100 mg by mouth daily.        . calcium citrate (CALCITRATE - DOSED IN MG ELEMENTAL CALCIUM) 950 MG tablet Take 200 mg of elemental calcium by mouth daily.      Marland Kitchen. lisinopril (PRINIVIL,ZESTRIL) 20 MG tablet Take 20 mg by mouth daily.        . naproxen sodium (ANAPROX) 220 MG tablet Take 220 mg by mouth 2 (two) times daily as needed (pain).       . polyethylene glycol (MIRALAX / GLYCOLAX) packet Take 17 g by mouth daily.      . polyethylene glycol-electrolytes (NULYTELY/GOLYTELY) 420 G solution Take  4,000 mLs by mouth once.  4000 mL  0    No results found for this or any previous visit (from the past 48 hour(s)). No results found.  ROS  Blood pressure 172/88, pulse 74, temperature 97.8 F (36.6 C), temperature source Oral, resp. rate 18, height 5\' 4"  (1.626 m), weight 121 lb (54.885 kg), SpO2 98.00%. Physical Exam  Constitutional: She appears well-developed and well-nourished.  HENT:  Mouth/Throat: Oropharynx is clear and moist.  Eyes: Conjunctivae are normal. No scleral icterus.  Neck: No thyromegaly present.  Cardiovascular: Normal rate, regular rhythm and normal heart sounds.   No murmur heard. Respiratory: Effort normal and breath sounds normal.  GI: Soft. She exhibits no distension and no mass. There is no tenderness.  Musculoskeletal: She exhibits no edema.  Lymphadenopathy:    She has no cervical adenopathy.  Neurological: She is alert.  Skin: Skin is warm.     Assessment/Plan History of colonic adenomas. Family history of CRC in a first-degree relative. Surveillance colonoscopy.  Tashiana Lamarca U 01/18/2014, 1:58 PM

## 2014-01-18 NOTE — Op Note (Signed)
COLONOSCOPY PROCEDURE REPORT  PATIENT:  Catherine Anthony  MR#:  [id number] Birthdate:  Nov 25, 1930, 80 y.o., female Endoscopist:  Dr. Malissa HippoNajeeb U. Rehman, MD Referred By:  Dr. Londell MohWalter Davidson Pharr, MD  Procedure Date: 01/18/2014  Procedure:   Colonoscopy  Indications:  Patient is an 80 year old Caucasian female who has history of colonic adenomas and is here for surveillance colonoscopy. Family history significant for colon carcinoma in her sister was in her 6960s. She gives history of passing thin caliber stools but this is not a new symptom. She denies rectal bleeding abdominal pain or weight loss.  Informed Consent:  The procedure and risks were reviewed with the patient and informed consent was obtained.  Medications:  Demerol 25 mg IV Versed 3 mg IV  Description of procedure:  After a digital rectal exam was performed, that colonoscope was advanced from the anus through the rectum and colon to the area of the cecum, ileocecal valve and appendiceal orifice. The cecum was deeply intubated. These structures were well-seen and photographed for the record. From the level of the cecum and ileocecal valve, the scope was slowly and cautiously withdrawn. The mucosal surfaces were carefully surveyed utilizing scope tip to flexion to facilitate fold flattening as needed. The scope was pulled down into the rectum where a thorough exam including retroflexion was performed.  Findings:   Prep excellent. Few scattered diverticula in sigmoid colon. Prominent hemorrhoids below the dentate line. Anal stenosis involving proximal segment of anal canal.   Therapeutic/Diagnostic Maneuvers Performed:   None  Complications:  None  Cecal Withdrawal Time:  13 minutes  Impression:  Examination performed to cecum. Anal stenosis appears to be chronic based on patient's history. Mild sigmoid colon diverticulosis. External hemorrhoids. No evidence of recurrent polyps.  Recommendations:  Standard instructions  given. If she develops further decrease in caliber of stools and has poor evacuation will need anal dilation under anesthesia. May consider her next exam in 5 years given personal history of adenomas and family history of colon carcinoma.  REHMAN,NAJEEB U  01/18/2014 2:42 PM  CC: Dr. Londell MohPHARR,WALTER DAVIDSON, MD & Dr. Bonnetta BarryNo ref. provider found

## 2014-01-18 NOTE — Discharge Instructions (Signed)
Resume usual medications and high fiber diet. No driving for 24 hours. May consider next colonoscopy in 5 years.  Colonoscopy, Care After Refer to this sheet in the next few weeks. These instructions provide you with information on caring for yourself after your procedure. Your health care provider may also give you more specific instructions. Your treatment has been planned according to current medical practices, but problems sometimes occur. Call your health care provider if you have any problems or questions after your procedure. WHAT TO EXPECT AFTER THE PROCEDURE  After your procedure, it is typical to have the following:  A small amount of blood in your stool.  Moderate amounts of gas and mild abdominal cramping or bloating. HOME CARE INSTRUCTIONS  Do not drive, operate machinery, or sign important documents for 24 hours.  You may shower and resume your regular physical activities, but move at a slower pace for the first 24 hours.  Take frequent rest periods for the first 24 hours.  Walk around or put a warm pack on your abdomen to help reduce abdominal cramping and bloating.  Drink enough fluids to keep your urine clear or pale yellow.  You may resume your normal diet as instructed by your health care provider. Avoid heavy or fried foods that are hard to digest.  Avoid drinking alcohol for 24 hours or as instructed by your health care provider.  Only take over-the-counter or prescription medicines as directed by your health care provider.  If a tissue sample (biopsy) was taken during your procedure:  Do not take aspirin or blood thinners for 7 days, or as instructed by your health care provider.  Do not drink alcohol for 7 days, or as instructed by your health care provider.  Eat soft foods for the first 24 hours. SEEK MEDICAL CARE IF: You have persistent spotting of blood in your stool 2 3 days after the procedure. SEEK IMMEDIATE MEDICAL CARE IF:  You have more than a  small spotting of blood in your stool.  You pass large blood clots in your stool.  Your abdomen is swollen (distended).  You have nausea or vomiting.  You have a fever.  You have increasing abdominal pain that is not relieved with medicine.

## 2014-01-22 ENCOUNTER — Encounter (HOSPITAL_COMMUNITY): Payer: Self-pay | Admitting: Internal Medicine

## 2014-05-23 DIAGNOSIS — M810 Age-related osteoporosis without current pathological fracture: Secondary | ICD-10-CM | POA: Diagnosis not present

## 2014-05-25 DIAGNOSIS — M810 Age-related osteoporosis without current pathological fracture: Secondary | ICD-10-CM | POA: Diagnosis not present

## 2014-12-27 DIAGNOSIS — Z23 Encounter for immunization: Secondary | ICD-10-CM | POA: Diagnosis not present

## 2014-12-27 DIAGNOSIS — E780 Pure hypercholesterolemia: Secondary | ICD-10-CM | POA: Diagnosis not present

## 2014-12-27 DIAGNOSIS — I10 Essential (primary) hypertension: Secondary | ICD-10-CM | POA: Diagnosis not present

## 2015-01-02 DIAGNOSIS — Z1212 Encounter for screening for malignant neoplasm of rectum: Secondary | ICD-10-CM | POA: Diagnosis not present

## 2015-01-02 DIAGNOSIS — I10 Essential (primary) hypertension: Secondary | ICD-10-CM | POA: Diagnosis not present

## 2015-01-02 DIAGNOSIS — M545 Low back pain: Secondary | ICD-10-CM | POA: Diagnosis not present

## 2015-01-02 DIAGNOSIS — E780 Pure hypercholesterolemia: Secondary | ICD-10-CM | POA: Diagnosis not present

## 2015-01-02 DIAGNOSIS — M810 Age-related osteoporosis without current pathological fracture: Secondary | ICD-10-CM | POA: Diagnosis not present

## 2015-05-29 DIAGNOSIS — M810 Age-related osteoporosis without current pathological fracture: Secondary | ICD-10-CM | POA: Diagnosis not present

## 2015-10-22 ENCOUNTER — Encounter: Payer: Self-pay | Admitting: Gastroenterology

## 2016-01-01 DIAGNOSIS — M810 Age-related osteoporosis without current pathological fracture: Secondary | ICD-10-CM | POA: Diagnosis not present

## 2016-01-01 DIAGNOSIS — E559 Vitamin D deficiency, unspecified: Secondary | ICD-10-CM | POA: Diagnosis not present

## 2016-01-01 DIAGNOSIS — Z0000 Encounter for general adult medical examination without abnormal findings: Secondary | ICD-10-CM | POA: Diagnosis not present

## 2016-01-01 DIAGNOSIS — Z79899 Other long term (current) drug therapy: Secondary | ICD-10-CM | POA: Diagnosis not present

## 2016-01-01 DIAGNOSIS — I10 Essential (primary) hypertension: Secondary | ICD-10-CM | POA: Diagnosis not present

## 2016-01-06 DIAGNOSIS — M810 Age-related osteoporosis without current pathological fracture: Secondary | ICD-10-CM | POA: Diagnosis not present

## 2016-01-06 DIAGNOSIS — I10 Essential (primary) hypertension: Secondary | ICD-10-CM | POA: Diagnosis not present

## 2016-01-06 DIAGNOSIS — M545 Low back pain: Secondary | ICD-10-CM | POA: Diagnosis not present

## 2016-01-06 DIAGNOSIS — K5730 Diverticulosis of large intestine without perforation or abscess without bleeding: Secondary | ICD-10-CM | POA: Diagnosis not present

## 2016-02-03 DIAGNOSIS — E559 Vitamin D deficiency, unspecified: Secondary | ICD-10-CM | POA: Diagnosis not present

## 2016-02-03 DIAGNOSIS — M810 Age-related osteoporosis without current pathological fracture: Secondary | ICD-10-CM | POA: Diagnosis not present

## 2016-02-24 DIAGNOSIS — Z1212 Encounter for screening for malignant neoplasm of rectum: Secondary | ICD-10-CM | POA: Diagnosis not present

## 2016-03-05 DIAGNOSIS — M810 Age-related osteoporosis without current pathological fracture: Secondary | ICD-10-CM | POA: Diagnosis not present

## 2016-03-16 DIAGNOSIS — M810 Age-related osteoporosis without current pathological fracture: Secondary | ICD-10-CM | POA: Diagnosis not present

## 2016-07-08 DIAGNOSIS — Z803 Family history of malignant neoplasm of breast: Secondary | ICD-10-CM | POA: Diagnosis not present

## 2016-07-08 DIAGNOSIS — Z1231 Encounter for screening mammogram for malignant neoplasm of breast: Secondary | ICD-10-CM | POA: Diagnosis not present

## 2016-07-24 ENCOUNTER — Other Ambulatory Visit: Payer: Self-pay

## 2016-09-18 DIAGNOSIS — M810 Age-related osteoporosis without current pathological fracture: Secondary | ICD-10-CM | POA: Diagnosis not present

## 2017-01-19 DIAGNOSIS — E7800 Pure hypercholesterolemia, unspecified: Secondary | ICD-10-CM | POA: Diagnosis not present

## 2017-01-19 DIAGNOSIS — N390 Urinary tract infection, site not specified: Secondary | ICD-10-CM | POA: Diagnosis not present

## 2017-01-19 DIAGNOSIS — Z0000 Encounter for general adult medical examination without abnormal findings: Secondary | ICD-10-CM | POA: Diagnosis not present

## 2017-01-19 DIAGNOSIS — M810 Age-related osteoporosis without current pathological fracture: Secondary | ICD-10-CM | POA: Diagnosis not present

## 2017-01-19 DIAGNOSIS — E559 Vitamin D deficiency, unspecified: Secondary | ICD-10-CM | POA: Diagnosis not present

## 2017-01-19 DIAGNOSIS — I10 Essential (primary) hypertension: Secondary | ICD-10-CM | POA: Diagnosis not present

## 2017-01-21 DIAGNOSIS — M810 Age-related osteoporosis without current pathological fracture: Secondary | ICD-10-CM | POA: Diagnosis not present

## 2017-01-21 DIAGNOSIS — R3129 Other microscopic hematuria: Secondary | ICD-10-CM | POA: Diagnosis not present

## 2017-01-21 DIAGNOSIS — I10 Essential (primary) hypertension: Secondary | ICD-10-CM | POA: Diagnosis not present

## 2017-01-21 DIAGNOSIS — E7800 Pure hypercholesterolemia, unspecified: Secondary | ICD-10-CM | POA: Diagnosis not present

## 2017-01-28 DIAGNOSIS — I10 Essential (primary) hypertension: Secondary | ICD-10-CM | POA: Diagnosis not present

## 2017-02-11 DIAGNOSIS — I10 Essential (primary) hypertension: Secondary | ICD-10-CM | POA: Diagnosis not present

## 2017-03-11 DIAGNOSIS — I10 Essential (primary) hypertension: Secondary | ICD-10-CM | POA: Diagnosis not present

## 2017-03-24 DIAGNOSIS — M810 Age-related osteoporosis without current pathological fracture: Secondary | ICD-10-CM | POA: Diagnosis not present

## 2017-07-09 DIAGNOSIS — R0602 Shortness of breath: Secondary | ICD-10-CM | POA: Diagnosis not present

## 2017-07-26 DIAGNOSIS — R0602 Shortness of breath: Secondary | ICD-10-CM | POA: Diagnosis not present

## 2017-07-26 DIAGNOSIS — I10 Essential (primary) hypertension: Secondary | ICD-10-CM | POA: Diagnosis not present

## 2017-07-27 DIAGNOSIS — R0602 Shortness of breath: Secondary | ICD-10-CM | POA: Diagnosis not present

## 2017-08-05 DIAGNOSIS — I350 Nonrheumatic aortic (valve) stenosis: Secondary | ICD-10-CM | POA: Diagnosis not present

## 2017-08-05 DIAGNOSIS — R0602 Shortness of breath: Secondary | ICD-10-CM | POA: Diagnosis not present

## 2017-08-12 DIAGNOSIS — R0602 Shortness of breath: Secondary | ICD-10-CM | POA: Diagnosis not present

## 2017-08-12 DIAGNOSIS — I10 Essential (primary) hypertension: Secondary | ICD-10-CM | POA: Diagnosis not present

## 2017-08-25 DIAGNOSIS — R0602 Shortness of breath: Secondary | ICD-10-CM | POA: Diagnosis not present

## 2017-08-25 DIAGNOSIS — Z0189 Encounter for other specified special examinations: Secondary | ICD-10-CM | POA: Diagnosis not present

## 2017-08-29 DIAGNOSIS — I350 Nonrheumatic aortic (valve) stenosis: Secondary | ICD-10-CM

## 2017-08-29 NOTE — H&P (Signed)
History of Present Illness Catherine Leep MD; 2017/08/23 12:24 PM) Patient words: Last O/V 07/26/2017; F/U for echo & labs.  The patient is a 83 year old female, accompanied by her daughter Catherine Anthony and Catherine Anthony) during the visit, who presents for a follow-up for Shortness of breath. 83 year old female seen by me on 07/26/2017 with complaints of shortness of breath. Her only medical history is hypertension which is well-controlled on 3 agents. On exam I had heard 3/6 systolic murmur with radiation to carotids. Suspecting aortic stenosis, I'll recommended an echocardiogram. Echocardiogram showed normal EF, grade 2 diastolic dysfunction. There was paradoxical atrial flutter low gradient aortic stenosis in the setting of low stroke volume. However it was at best moderate in severity based on the gradient and max velocity of 18 mmHg and 2.4 m/s. But aortic valve area measured at 0.9 cm. There was moderate aortic regurgitation. There was also moderate mitral annular calcification with mild mitral regurgitation.  Patient is here ttoday for follow up with her two daughters. Patient daughters are concerned about patient's continued symptoms of shortness of breath and decreased exercise tolerance. She denies any chest pain, lightheadedness, syncope was. Denies any leg swelling.   Problem List/Past Medical (April Garrison; 08-23-17 10:33 AM) Shortness of breath (R06.02)  Benign essential hypertension (I10)  Laboratory examination (Z01.89)  Labs 07/26/2017: WBC 7.9. Hemoglobin 13.6, hematocrit 40.2. Platelets 315. MCV microcytic 98. Glucose 115. BUN 18, creatinine 1.03. EGFR 50. Sodium 138 potassium 4.9. Calcium 10.4 (8.7-10.3) BMP 142.5 Labs 01/19/2017: WBC 4.8. Hemoglobin 14.5, hematocrit 42.2. MCV 5.7 normocytic. Glucose 25, BUN 18, creatinine 0.8, EGFR 77-93 Total protein 7.0, albumin 3.6. Normal liver enzymes. Sodium 138 potassium 4.3.  Allergies (April Garrison; 08-23-17 10:38 AM) Codeine  Sulfate *ANALGESICS - OPIOID*  Nausea. Lacri-Lube *OPHTHALMIC AGENTS*  Itching, Swelling.  Family History (April Garrison; 08/23/2017 10:33 AM) Mother  Deceased. at age 78; unsure Father  Deceased. at age 46; MI (first one at age 20) Sister 70  1 older; no heart issues Brother 5  older; no heart issues; 1 died from car accident; cancer  Social History (April Garrison; August 23, 2017 10:33 AM) Current tobacco use  Never smoker. Non Drinker/No Alcohol Use  Marital status  Widowed. Living Situation  Lives with relatives. Number of Children  5.  Past Surgical History (April Garrison; 2017/08/23 10:33 AM) None [07/26/2017]:  Medication History (April Garrison; 08/23/2017 10:40 AM) Lisinopril ('40MG'$  Tablet, 1 Oral daily) Active. AmLODIPine Besylate ('10MG'$  Tablet, 1 Oral daily) Active. Atenolol ('100MG'$  Tablet, 1 Oral daily) Active. Medications Reconciled (verbal per pt)  Diagnostic Studies History (April Garrison; 2017-08-23 10:33 AM) Echocardiogram [08/05/2017]: Left ventricle cavity is small. Normal global wall motion. Visual EF is 55-60%. Doppler evidence of grade II (pseudonormal) diastolic dysfunction. Diastolic dysfunction findings suggests elevated LA/LV end diastolic pressure. Left atrial cavity is mildly dilated by volume. Right atrial cavity is mildly dilated. Probably trileaflet aortic valve with moderate aortic stenosis (Paradoxically low flow- low gradient) in the setting of low stroke volume index. Moderate (Grade II) aortic regurgitation. Moderate mitral annular and leaflet calcification. Mild functional stenosis. Mild (Grade I) mitral regurgitation. Moderate-severe tricuspid regurgitation. Mild to moderate pulmonary hypertension. Pulmonary artery systolic pressure is estimated at 35-40 mm Hg.    Review of Systems Catherine Leep, MD; 08/23/17 12:32 PM) General Not Present- Appetite Loss and Weight Gain. Respiratory Not Present- Chronic Cough and Wakes up from  Sleep Wheezing or Short of Breath. Cardiovascular Present- Difficulty Breathing On Exertion and Hypertension. Not Present- Chest Pain, Difficulty Breathing Lying Down,  Edema, Fainting and Orthopnea. Gastrointestinal Not Present- Black, Tarry Stool and Difficulty Swallowing. Musculoskeletal Not Present- Decreased Range of Motion and Muscle Atrophy. Neurological Not Present- Attention Deficit. Psychiatric Not Present- Personality Changes and Suicidal Ideation. Endocrine Not Present- Cold Intolerance and Heat Intolerance. Hematology Not Present- Abnormal Bleeding. All other systems negative  Vitals (April Garrison; 08/12/2017 10:42 AM) 08/12/2017 10:34 AM Weight: 114.25 lb Height: 64in Body Surface Area: 1.54 m Body Mass Index: 19.61 kg/m  Pulse: 69 (Regular)  P.OX: 98% (Room air) BP: 140/82 (Sitting, Left Arm, Standard)       Physical Exam Catherine Leep, MD; 08/12/2017 12:32 PM) General Mental Status-Alert. General Appearance-Cooperative, Appears stated age, Not in acute distress. Build & Nutrition-Moderately built.  Head and Neck Thyroid Gland Characteristics - no palpable nodules, no palpable enlargement.  Chest and Lung Exam Palpation Tender - No chest wall tenderness. Auscultation Breath sounds - Clear.  Cardiovascular Inspection Jugular vein - Right - No Distention. Auscultation Rhythm - Regular. Heart Sounds - S1 WNL and No gallop present. Note: Single S2. Murmurs & Other Heart Sounds: Murmur - Location - Aortic Area. Timing - Holosystolic. Grade - III/VI. Character - Crescendo/Decrescendo. Radiation - Right carotid and Left carotid.  Abdomen Palpation/Percussion Normal exam - Non Tender and No hepatosplenomegaly. Auscultation Normal exam - Bowel sounds normal.  Peripheral Vascular Lower Extremity Inspection - Bilateral - Inspection Normal. Palpation - Edema - Bilateral - No edema. Femoral pulse - Bilateral - 2+. Popliteal pulse - Bilateral  - Normal. Dorsalis pedis pulse - Left - 2+. Right - 1+. Posterior tibial pulse - Left - 2+. Right - 2+. Bilateral - Normal. Carotid arteries - Bilateral-No Carotid bruit. Note: Bilateral carotid sounds likely conducted aortic murmur Abdomen-No prominent abdominal aortic pulsation, No epigastric bruit.  Neurologic Neurologic evaluation reveals -alert and oriented x 3 with no impairment of recent or remote memory. Motor-Grossly intact without any focal deficits.  Musculoskeletal Global Assessment Left Lower Extremity - normal range of motion without pain. Right Lower Extremity - normal range of motion without pain.   Results Joya Gaskins Markis Langland MD; 08/12/2017 12:32 PM) Labs  Name Value Range Date CBC & PLATELETS (AUTO) [phone])   Collected: 07/27/2017 3:38 PM WBC 7.9 x10E3/uL 3.4-10.8 x10E3/uL Collected: 07/27/2017 3:38 PM RBC 4.09 x10E6/uL 3.77-5.28 x10E6/uL Collected: 07/27/2017 3:38 PM Hemoglobin 13.6 g/dL 11.1-15.9 g/dL Collected: 07/27/2017 3:38 PM Hematocrit 40.2 % 34.0-46.6 % Collected: 07/27/2017 3:38 PM MCV 98 fL 79-97 fL Collected: 07/27/2017 3:38 PM MCH 33.3 pg 26.6-33.0 pg Collected: 07/27/2017 3:38 PM MCHC 33.8 g/dL 31.5-35.7 g/dL Collected: 07/27/2017 3:38 PM RDW 13.4 % 12.3-15.4 % Collected: 07/27/2017 3:38 PM Platelets 315 x10E3/uL 150-379 x10E3/uL Collected: 07/27/2017 3:38 PM Neutrophils 58 % Not Estab. % Collected: 07/27/2017 3:38 PM Lymphs 32 % Not Estab. % Collected: 07/27/2017 3:38 PM Monocytes 7 % Not Estab. % Collected: 07/27/2017 3:38 PM Eos 2 % Not Estab. % Collected: 07/27/2017 3:38 PM Basos 1 % Not Estab. % Collected: 07/27/2017 3:38 PM Immature Cells   Collected: 07/27/2017 3:38 PM Neutrophils (Absolute) 4.6 x10E3/uL 1.4-7.0 x10E3/uL Collected: 07/27/2017 3:38 PM Lymphs (Absolute) 2.5 x10E3/uL 0.7-3.1 x10E3/uL Collected:  07/27/2017 3:38 PM Monocytes(Absolute) 0.5 x10E3/uL 0.1-0.9 x10E3/uL Collected: 07/27/2017 3:38 PM Eos (Absolute) 0.2 x10E3/uL 0.0-0.4 x10E3/uL Collected: 07/27/2017 3:38 PM Baso (Absolute) 0.0 x10E3/uL 0.0-0.2 x10E3/uL Collected: 07/27/2017 3:38 PM Immature Granulocytes 0 % Not Estab. % Collected: 07/27/2017 3:38 PM Immature Grans (Abs) 0.0 x10E3/uL 0.0-0.1 x10E3/uL Collected: 07/27/2017 3:38 PM NRBC   Collected: 07/27/2017 3:38 PM Hematology Comments:   Collected: 07/27/2017  7:35 PM METABOLIC PANEL, BASIC ([id number])   Collected: 07/27/2017 3:38 PM Glucose 115 mg/dL 65-99 mg/dL Collected: 07/27/2017 3:38 PM BUN 18 mg/dL 8-27 mg/dL Collected: 07/27/2017 3:38 PM Creatinine 1.03 mg/dL 0.57-1.00 mg/dL Collected: 07/27/2017 3:38 PM eGFR If NonAfricn Am 50 mL/min/1.73 >59 mL/min/1.73 Collected: 07/27/2017 3:38 PM eGFR If Africn Am 58 mL/min/1.73 >59 mL/min/1.73 Collected: 07/27/2017 3:38 PM BUN/Creatinine Ratio 17 12-28 Collected: 07/27/2017 3:38 PM Sodium 138 mmol/L 134-144 mmol/L Collected: 07/27/2017 3:38 PM Potassium 4.9 mmol/L 3.5-5.2 mmol/L Collected: 07/27/2017 3:38 PM Chloride 100 mmol/L 96-106 mmol/L Collected: 07/27/2017 3:38 PM Carbon Dioxide, Total 24 mmol/L 20-29 mmol/L Collected: 07/27/2017 3:38 PM Calcium 10.4 mg/dL 8.7-10.3 mg/dL Collected: 07/27/2017 3:38 PM ASSAY, NATIURETIC PEPTIDE ([id number])   Collected: 07/27/2017 3:38 PM B-Type Natriuretic Peptide 142.5 pg/mL 0.0-100.0 pg/mL Collected: 07/27/2017 3:38 PM  Procedures Name Value Date Echocardiography, transthoracic, real-time with image documentation (2D), includes M-mode recording, when performed, complete, with spectral Doppler echocardiography, and with color flow Doppler echocardiography ([id number]) Comments: Echocardiogram 08/05/2017: Left ventricle cavity is small. Normal global wall  motion. Visual EF is 55-60%. Doppler evidence of grade II (pseudonormal) diastolic dysfunction. Diastolic dysfunction findings suggests elevated LA/LV end diastolic pressure. Left atrial cavity is mildly dilated by volume. Right atrial cavity is mildly dilated. Probably trileaflet aortic valve with moderate aortic stenosis (Paradoxically low flow- low gradient) in the setting of low stroke volume index. Moderate (Grade II) aortic regurgitation. Moderate mitral annular and leaflet calcification. Mild functional stenosis. Mild (Grade I) mitral regurgitation. Moderate-severe tricuspid regurgitation. Mild to moderate pulmonary hypertension. Pulmonary artery systolic pressure is estimated at 35-40 mm Hg.  Performed: 08/05/2017 12:32 PM    Assessment & Plan Joya Gaskins Merleen Picazo MD; 08/12/2017 12:32 PM) Shortness of breath on exertion (R06.02) Story: Echocardiogram 08/05/2017: Left ventricle cavity is small. Normal global wall motion. Visual EF is 55-60%. Doppler evidence of grade II (pseudonormal) diastolic dysfunction. Diastolic dysfunction findings suggests elevated LA/LV end diastolic pressure. Left atrial cavity is mildly dilated by volume. Right atrial cavity is mildly dilated. Probably trileaflet aortic valve with moderate aortic stenosis (Paradoxically low flow- low gradient) in the setting of low stroke volume index. Moderate (Grade II) aortic regurgitation. Moderate mitral annular and leaflet calcification. Mild functional stenosis. Mild (Grade I) mitral regurgitation. Moderate-severe tricuspid regurgitation. Mild to moderate pulmonary hypertension. Pulmonary artery systolic pressure is estimated at 35-40 mm Hg. Current Plans Started Aspirin 81 81MG, 1 (one) Tablet once daily 30 minutes before meal, #60, 60 days starting 08/12/2017, Ref. x2. Started Rosuvastatin Calcium 10MG, 1 (one) Tablet once daily, #60, 60 days starting 08/12/2017, Ref. x2. Hypertension, benign  (I10)  Note:.  Recommendations:  Hemodynamic data is incongruent with severe aortic stenosis but her symptoms are concerning. This may be a comminution of mixed valvular diseaseand possibly coronary artery disease. Noninvasive stress test may provide information about ischemia but will still need a left and right heart catheterization to evaluate the valve given the discrepancies on echocardiogram. Invasive hemodynamic evaluation would offer information about her filling pressures, valves, as well as her coronary arteries. If she has any moderate coronary artery stenosis, we can perform FFR evaluation to assist the hemodynamic significance. Patient and her family would like to pursue. Minimally invasive percutaneous treatment options along with medications as opposed to invasive surgery given her age. If I find significant coronary artery disease during the heart catheterization, I will proceed with coronary revascularization if feasible. Based on the invasive hemodynamic data, I will then refer her to Dr. Burt Knack or Dr. Angelena Form as necessary for  TAVR evaluation. I explained to the patient and daughters, that if she has severe aortic stenosis stage D3, her outcomes with potential TAVR would still be better than medical therapy may not be as good as that in patients with severe aortic stenosis stage D1. Medical therapy alone remains an option but patient and daughter would like to proceed with further evaluation to guide management.   In the meantime I will make the following changes to her medical regimen. Increase atenolol to 150 mg daily. Decrease amlodipine to 5 mg daily. Had aspirin 81 mg and rosuvastatin 10 mg. Obtain lipid panel. I have reviewed her recent CBC, basic metabolic panel as well as BNP.  Schedule for cardiac catheterization, and possible angioplasty. We discussed regarding risks, benefits, alternatives to this including stress testing, CTA and continued medical therapy.  Patient wants to proceed. Understands 1-2% risk of death, stroke, MI, urgent CABG, bleeding, infection, renal failure but not limited to these. Video recording of the procedure shown to the patient. This was a greater than 40 minute office visit with greater than 50% of the time spent with face-to-face encounter with patient and evaluation of complex medical issues, review of external records and coordination of care.    Cc Deland Pretty, MD  Signed electronically by Catherine Leep, MD (08/12/2017 12:33 PM)

## 2017-08-31 ENCOUNTER — Ambulatory Visit (HOSPITAL_COMMUNITY)
Admission: RE | Admit: 2017-08-31 | Discharge: 2017-09-01 | Disposition: A | Discharge status: Home / Self Care | Payer: Medicare Other | Source: Ambulatory Visit | Attending: Cardiology | Admitting: Cardiology

## 2017-08-31 ENCOUNTER — Ambulatory Visit (HOSPITAL_COMMUNITY)
Admission: RE | Disposition: A | Discharge status: Home / Self Care | Payer: Self-pay | Source: Ambulatory Visit | Attending: Cardiology

## 2017-08-31 ENCOUNTER — Encounter (HOSPITAL_COMMUNITY): Payer: Self-pay | Admitting: General Practice

## 2017-08-31 DIAGNOSIS — I10 Essential (primary) hypertension: Secondary | ICD-10-CM | POA: Insufficient documentation

## 2017-08-31 DIAGNOSIS — I2510 Atherosclerotic heart disease of native coronary artery without angina pectoris: Secondary | ICD-10-CM | POA: Diagnosis not present

## 2017-08-31 DIAGNOSIS — R0609 Other forms of dyspnea: Secondary | ICD-10-CM | POA: Diagnosis not present

## 2017-08-31 DIAGNOSIS — Z7902 Long term (current) use of antithrombotics/antiplatelets: Secondary | ICD-10-CM | POA: Diagnosis not present

## 2017-08-31 DIAGNOSIS — Z882 Allergy status to sulfonamides status: Secondary | ICD-10-CM | POA: Diagnosis not present

## 2017-08-31 DIAGNOSIS — I25118 Atherosclerotic heart disease of native coronary artery with other forms of angina pectoris: Secondary | ICD-10-CM | POA: Diagnosis present

## 2017-08-31 DIAGNOSIS — Z79899 Other long term (current) drug therapy: Secondary | ICD-10-CM | POA: Insufficient documentation

## 2017-08-31 DIAGNOSIS — I350 Nonrheumatic aortic (valve) stenosis: Secondary | ICD-10-CM | POA: Diagnosis not present

## 2017-08-31 DIAGNOSIS — I2584 Coronary atherosclerosis due to calcified coronary lesion: Secondary | ICD-10-CM | POA: Diagnosis not present

## 2017-08-31 DIAGNOSIS — I4892 Unspecified atrial flutter: Secondary | ICD-10-CM | POA: Insufficient documentation

## 2017-08-31 DIAGNOSIS — Z7982 Long term (current) use of aspirin: Secondary | ICD-10-CM | POA: Insufficient documentation

## 2017-08-31 DIAGNOSIS — I080 Rheumatic disorders of both mitral and aortic valves: Secondary | ICD-10-CM | POA: Insufficient documentation

## 2017-08-31 DIAGNOSIS — Z955 Presence of coronary angioplasty implant and graft: Secondary | ICD-10-CM

## 2017-08-31 HISTORY — DX: Atherosclerotic heart disease of native coronary artery without angina pectoris: I25.10

## 2017-08-31 HISTORY — DX: Adverse effect of unspecified anesthetic, initial encounter: T41.45XA

## 2017-08-31 HISTORY — PX: CORONARY STENT INTERVENTION: CATH118234

## 2017-08-31 HISTORY — DX: Cardiac murmur, unspecified: R01.1

## 2017-08-31 HISTORY — DX: Other complications of anesthesia, initial encounter: T88.59XA

## 2017-08-31 HISTORY — DX: Other specified postprocedural states: Z98.890

## 2017-08-31 HISTORY — DX: Nausea with vomiting, unspecified: R11.2

## 2017-08-31 HISTORY — PX: RIGHT/LEFT HEART CATH AND CORONARY ANGIOGRAPHY: CATH118266

## 2017-08-31 LAB — POCT I-STAT 3, VENOUS BLOOD GAS (G3P V)
Acid-base deficit: 1 mmol/L (ref 0.0–2.0)
BICARBONATE: 24 mmol/L (ref 20.0–28.0)
O2 SAT: 66 %
PCO2 VEN: 40.7 mmHg — AB (ref 44.0–60.0)
TCO2: 25 mmol/L (ref 22–32)
pH, Ven: 7.378 (ref 7.250–7.430)
pO2, Ven: 35 mmHg (ref 32.0–45.0)

## 2017-08-31 LAB — POCT ACTIVATED CLOTTING TIME
ACTIVATED CLOTTING TIME: 180 s
ACTIVATED CLOTTING TIME: 224 s
ACTIVATED CLOTTING TIME: 362 s

## 2017-08-31 LAB — POCT I-STAT 3, ART BLOOD GAS (G3+)
ACID-BASE DEFICIT: 2 mmol/L (ref 0.0–2.0)
Bicarbonate: 22.8 mmol/L (ref 20.0–28.0)
O2 Saturation: 96 %
PH ART: 7.387 (ref 7.350–7.450)
TCO2: 24 mmol/L (ref 22–32)
pCO2 arterial: 37.9 mmHg (ref 32.0–48.0)
pO2, Arterial: 81 mmHg — ABNORMAL LOW (ref 83.0–108.0)

## 2017-08-31 SURGERY — RIGHT/LEFT HEART CATH AND CORONARY ANGIOGRAPHY
Anesthesia: LOCAL

## 2017-08-31 MED ORDER — HEPARIN (PORCINE) IN NACL 2-0.9 UNIT/ML-% IJ SOLN
INTRAMUSCULAR | Status: AC | PRN
  Administered 2017-08-31: 1000 mL via INTRA_ARTERIAL

## 2017-08-31 MED ORDER — SODIUM CHLORIDE 0.9% FLUSH: 3.0000 mL | Freq: Two times a day (BID) | INTRAVENOUS | Status: DC

## 2017-08-31 MED ORDER — ATENOLOL 100 MG PO TABS
150.0000 mg | ORAL_TABLET | Freq: Every day | ORAL | Status: DC
  Administered 2017-09-01: 11:00:00 150 mg via ORAL
  Filled 2017-08-31: qty 2
  Filled 2017-08-31: qty 1

## 2017-08-31 MED ORDER — HEPARIN SODIUM (PORCINE) 1000 UNIT/ML IJ SOLN
INTRAMUSCULAR | Status: DC | PRN
  Administered 2017-08-31: 1000 [IU] via INTRAVENOUS
  Administered 2017-08-31: 2000 [IU] via INTRAVENOUS
  Administered 2017-08-31: 5000 [IU] via INTRAVENOUS
  Administered 2017-08-31 (×2): 2000 [IU] via INTRAVENOUS

## 2017-08-31 MED ORDER — AMLODIPINE BESYLATE 5 MG PO TABS
5.0000 mg | ORAL_TABLET | Freq: Every day | ORAL | Status: DC
  Administered 2017-09-01: 5 mg via ORAL
  Filled 2017-08-31: qty 1

## 2017-08-31 MED ORDER — ASPIRIN 81 MG PO CHEW: 81.0000 mg | CHEWABLE_TABLET | ORAL | Status: DC

## 2017-08-31 MED ORDER — IOPAMIDOL (ISOVUE-370) INJECTION 76%
INTRAVENOUS | Status: DC | PRN
  Administered 2017-08-31: 155 mL via INTRA_ARTERIAL

## 2017-08-31 MED ORDER — ROSUVASTATIN CALCIUM 10 MG PO TABS
10.0000 mg | ORAL_TABLET | ORAL | Status: DC
  Administered 2017-09-01: 10 mg via ORAL
  Filled 2017-08-31: qty 1

## 2017-08-31 MED ORDER — NITROGLYCERIN 1 MG/10 ML FOR IR/CATH LAB
INTRA_ARTERIAL | Status: AC
  Filled 2017-08-31: qty 10

## 2017-08-31 MED ORDER — HEPARIN SODIUM (PORCINE) 1000 UNIT/ML IJ SOLN
INTRAMUSCULAR | Status: AC
  Filled 2017-08-31: qty 1

## 2017-08-31 MED ORDER — LISINOPRIL 40 MG PO TABS
40.0000 mg | ORAL_TABLET | Freq: Every day | ORAL | Status: DC
  Administered 2017-09-01: 40 mg via ORAL
  Filled 2017-08-31: qty 2
  Filled 2017-08-31: qty 1

## 2017-08-31 MED ORDER — ACETAMINOPHEN 325 MG PO TABS: 650.0000 mg | ORAL_TABLET | ORAL | Status: DC | PRN

## 2017-08-31 MED ORDER — FENTANYL CITRATE (PF) 100 MCG/2ML IJ SOLN
INTRAMUSCULAR | Status: AC
  Filled 2017-08-31: qty 2

## 2017-08-31 MED ORDER — SODIUM CHLORIDE 0.9 % IV SOLN: 250.0000 mL | INTRAVENOUS | Status: DC | PRN

## 2017-08-31 MED ORDER — IOPAMIDOL (ISOVUE-370) INJECTION 76%
INTRAVENOUS | Status: AC
  Filled 2017-08-31: qty 100

## 2017-08-31 MED ORDER — LIDOCAINE HCL (PF) 1 % IJ SOLN
INTRAMUSCULAR | Status: DC | PRN
  Administered 2017-08-31 (×2): 2 mL via INTRADERMAL

## 2017-08-31 MED ORDER — SODIUM CHLORIDE 0.9% FLUSH: 3.0000 mL | INTRAVENOUS | Status: DC | PRN

## 2017-08-31 MED ORDER — FENTANYL CITRATE (PF) 100 MCG/2ML IJ SOLN
INTRAMUSCULAR | Status: DC | PRN
  Administered 2017-08-31 (×2): 25 ug via INTRAVENOUS

## 2017-08-31 MED ORDER — HEPARIN (PORCINE) IN NACL 2-0.9 UNIT/ML-% IJ SOLN
INTRAMUSCULAR | Status: AC
  Filled 2017-08-31: qty 1000

## 2017-08-31 MED ORDER — LIDOCAINE HCL 2 % IJ SOLN
INTRAMUSCULAR | Status: AC
  Filled 2017-08-31: qty 10

## 2017-08-31 MED ORDER — MIDAZOLAM HCL 2 MG/2ML IJ SOLN
INTRAMUSCULAR | Status: DC | PRN
  Administered 2017-08-31 (×2): 1 mg via INTRAVENOUS

## 2017-08-31 MED ORDER — SODIUM CHLORIDE 0.9 % IV SOLN
INTRAVENOUS | Status: DC
  Administered 2017-08-31: 12:00:00 via INTRAVENOUS

## 2017-08-31 MED ORDER — ANGIOPLASTY BOOK
Freq: Once | Status: AC
  Administered 2017-08-31: 21:00:00
  Filled 2017-08-31: qty 1

## 2017-08-31 MED ORDER — CLOPIDOGREL BISULFATE 75 MG PO TABS
75.0000 mg | ORAL_TABLET | Freq: Every day | ORAL | Status: DC
  Administered 2017-09-01: 75 mg via ORAL
  Filled 2017-08-31: qty 1

## 2017-08-31 MED ORDER — CLOPIDOGREL BISULFATE 300 MG PO TABS
ORAL_TABLET | ORAL | Status: AC
  Filled 2017-08-31: qty 2

## 2017-08-31 MED ORDER — SODIUM CHLORIDE 0.9 % IV SOLN
INTRAVENOUS | Status: AC
  Administered 2017-08-31: 16:00:00 via INTRAVENOUS

## 2017-08-31 MED ORDER — MIDAZOLAM HCL 2 MG/2ML IJ SOLN
INTRAMUSCULAR | Status: AC
  Filled 2017-08-31: qty 2

## 2017-08-31 MED ORDER — ONDANSETRON HCL 4 MG/2ML IJ SOLN: 4.0000 mg | Freq: Four times a day (QID) | INTRAMUSCULAR | Status: DC | PRN

## 2017-08-31 MED ORDER — VERAPAMIL HCL 2.5 MG/ML IV SOLN
INTRAVENOUS | Status: AC
  Filled 2017-08-31: qty 2

## 2017-08-31 MED ORDER — SODIUM CHLORIDE 0.9 % IV SOLN: INTRAVENOUS | Status: DC

## 2017-08-31 MED ORDER — VERAPAMIL HCL 2.5 MG/ML IV SOLN
INTRAVENOUS | Status: DC | PRN
  Administered 2017-08-31: 10 mL via INTRA_ARTERIAL

## 2017-08-31 MED ORDER — NITROGLYCERIN 1 MG/10 ML FOR IR/CATH LAB
INTRA_ARTERIAL | Status: DC | PRN
  Administered 2017-08-31: 200 ug via INTRACORONARY

## 2017-08-31 SURGICAL SUPPLY — 26 items
BALLN SAPPHIRE 2.5X20 (BALLOONS) ×2
BALLN ~~LOC~~ EUPHORA RX 3.5X12 (BALLOONS) ×2
BALLOON SAPPHIRE 2.5X20 (BALLOONS) IMPLANT
BALLOON ~~LOC~~ EUPHORA RX 3.5X12 (BALLOONS) IMPLANT
CATH BALLN WEDGE 5F 110CM (CATHETERS) ×1 IMPLANT
CATH EXPO 5F FL3.5 (CATHETERS) ×1 IMPLANT
CATH INFINITI 5FR AL1 (CATHETERS) ×1 IMPLANT
CATH INFINITI JR4 5F (CATHETERS) ×1 IMPLANT
CATH LANGSTON DUAL LUM PIG 6FR (CATHETERS) ×1 IMPLANT
CATH VISTA GUIDE 6FR JR4 (CATHETERS) ×1 IMPLANT
COVER PRB 48X5XTLSCP FOLD TPE (BAG) IMPLANT
COVER PROBE 5X48 (BAG) ×2
DEVICE RAD COMP TR BAND LRG (VASCULAR PRODUCTS) ×1 IMPLANT
GLIDESHEATH SLEND A-KIT 6F 22G (SHEATH) ×1 IMPLANT
GUIDEWIRE INQWIRE 1.5J.035X260 (WIRE) IMPLANT
INQWIRE 1.5J .035X260CM (WIRE) ×2
KIT HEART LEFT (KITS) ×2 IMPLANT
PACK CARDIAC CATHETERIZATION (CUSTOM PROCEDURE TRAY) ×2 IMPLANT
SHEATH GLIDE SLENDER 4/5FR (SHEATH) ×1 IMPLANT
STENT RESOLUTE ONYX 3.5X26 (Permanent Stent) ×1 IMPLANT
TRANSDUCER W/STOPCOCK (MISCELLANEOUS) ×2 IMPLANT
TUBING CIL FLEX 10 FLL-RA (TUBING) ×2 IMPLANT
WIRE COUGAR XT STRL 190CM (WIRE) ×1 IMPLANT
WIRE EMERALD 3MM-J .025X260CM (WIRE) ×1 IMPLANT
WIRE EMERALD ST .035X260CM (WIRE) ×1 IMPLANT
WIRE HI TORQ VERSACORE-J 145CM (WIRE) ×1 IMPLANT

## 2017-08-31 NOTE — Interval H&P Note (Signed)
History and Physical Interval Note:  08/31/2017 12:28 PM  Catherine Anthony  has presented today for surgery, with the diagnosis of valve disease  The various methods of treatment have been discussed with the patient and family. After consideration of risks, benefits and other options for treatment, the patient has consented to  Procedure(s): RIGHT/LEFT HEART CATH AND CORONARY ANGIOGRAPHY (N/A) as a surgical intervention .  The patient's history has been reviewed, patient examined, no change in status, stable for surgery.  I have reviewed the patient's chart and labs.  Questions were answered to the patient's satisfaction.    Indication:  1. Chronic Native or Prosthetic Valvular Disease 2. Symptomatic Related to Valvular Disease 3. Mild or moderate mitral regurgitation 4. Noninvasive Imaging for Valvular Disease Conflicting With Clinical Impression of Severity A (7) Indication: 83; Score 7 267 Indication:  1. Chronic Native or Prosthetic Valvular Disease 2. Symptomatic Related to Valvular Disease 3. Mild or moderate aortic stenosis 4. Noninvasive Imaging for Valvular Disease Conflicting With Clinical Impression of Severity A (7) Indication: 85; Score 7       Catherine Anthony

## 2017-08-31 NOTE — Progress Notes (Signed)
Site area: right groin  Site Prior to Removal:  Level 0  Pressure Applied For 15 MINUTES    Minutes Beginning at 1815  Manual:   Yes.    Patient Status During Pull:  stable  Post Pull Groin Site:  Level 0  Post Pull Instructions Given:  Yes.    Post Pull Pulses Present:  Yes.    Dressing Applied:  Yes.    Comments:  Right brachial veinous sheath.

## 2017-09-01 ENCOUNTER — Encounter (HOSPITAL_COMMUNITY): Payer: Self-pay | Admitting: Cardiology

## 2017-09-01 DIAGNOSIS — R0609 Other forms of dyspnea: Secondary | ICD-10-CM | POA: Diagnosis not present

## 2017-09-01 DIAGNOSIS — I2510 Atherosclerotic heart disease of native coronary artery without angina pectoris: Secondary | ICD-10-CM | POA: Diagnosis not present

## 2017-09-01 DIAGNOSIS — I080 Rheumatic disorders of both mitral and aortic valves: Secondary | ICD-10-CM | POA: Diagnosis not present

## 2017-09-01 DIAGNOSIS — Z7902 Long term (current) use of antithrombotics/antiplatelets: Secondary | ICD-10-CM | POA: Diagnosis not present

## 2017-09-01 DIAGNOSIS — I4892 Unspecified atrial flutter: Secondary | ICD-10-CM | POA: Diagnosis not present

## 2017-09-01 DIAGNOSIS — I10 Essential (primary) hypertension: Secondary | ICD-10-CM | POA: Diagnosis not present

## 2017-09-01 DIAGNOSIS — Z7982 Long term (current) use of aspirin: Secondary | ICD-10-CM | POA: Diagnosis not present

## 2017-09-01 DIAGNOSIS — I350 Nonrheumatic aortic (valve) stenosis: Secondary | ICD-10-CM | POA: Diagnosis not present

## 2017-09-01 LAB — CBC
HEMATOCRIT: 40.4 % (ref 36.0–46.0)
HEMOGLOBIN: 13.4 g/dL (ref 12.0–15.0)
MCH: 31.4 pg (ref 26.0–34.0)
MCHC: 33.2 g/dL (ref 30.0–36.0)
MCV: 94.6 fL (ref 78.0–100.0)
Platelets: 275 10*3/uL (ref 150–400)
RBC: 4.27 MIL/uL (ref 3.87–5.11)
RDW: 12.2 % (ref 11.5–15.5)
WBC: 7.7 10*3/uL (ref 4.0–10.5)

## 2017-09-01 LAB — BASIC METABOLIC PANEL
ANION GAP: 8 (ref 5–15)
BUN: 13 mg/dL (ref 6–20)
CO2: 23 mmol/L (ref 22–32)
Calcium: 9 mg/dL (ref 8.9–10.3)
Chloride: 104 mmol/L (ref 101–111)
Creatinine, Ser: 0.71 mg/dL (ref 0.44–1.00)
GFR calc Af Amer: >60 mL/min (ref >60)
GFR calc non Af Amer: >60 mL/min (ref >60)
GLUCOSE: 85 mg/dL (ref 65–99)
POTASSIUM: 3.4 mmol/L — AB (ref 3.5–5.1)
Sodium: 135 mmol/L (ref 135–145)

## 2017-09-01 MED ORDER — ASPIRIN 81 MG PO CHEW: 81.0000 mg | CHEWABLE_TABLET | Freq: Every day | ORAL | 6 refills | Status: DC

## 2017-09-01 MED ORDER — POTASSIUM CHLORIDE CRYS ER 20 MEQ PO TBCR
40.0000 meq | EXTENDED_RELEASE_TABLET | Freq: Once | ORAL | Status: AC
  Administered 2017-09-01: 40 meq via ORAL
  Filled 2017-09-01: qty 2

## 2017-09-01 MED ORDER — CLOPIDOGREL BISULFATE 75 MG PO TABS: 75.0000 mg | ORAL_TABLET | Freq: Every day | ORAL | 3 refills | Status: DC

## 2017-09-01 MED FILL — Clopidogrel Bisulfate Tab 300 MG (Base Equiv): ORAL | Qty: 1 | Status: AC

## 2017-09-01 MED FILL — Lidocaine HCl Local Inj 2%: INTRAMUSCULAR | Qty: 10 | Status: AC

## 2017-09-01 NOTE — Care Management Note (Signed)
Case Management Note  Patient Details  Name: Catherine Anthony MRN: [id number] Date of Birth: May 07, 1930  Subjective/Objective:   From home with daughter , [name], pta indep, s/p coronary stent intervention, will be on plavix,she has a PCP and medication coverage. For DC today, she ambulated with Cardiac Rehab and did well.  No needs.                   Action/Plan:   Expected Discharge Date:  09/01/17               Expected Discharge Plan:  Home/Self Care  In-House Referral:     Discharge planning Services  CM Consult  Post Acute Care Choice:    Choice offered to:     DME Arranged:    DME Agency:     HH Arranged:    HH Agency:     Status of Service:  Completed, signed off  If discussed at [employer] of Stay Meetings, dates discussed:    Additional Comments:  Leone Haven, RN 09/01/2017, 9:39 AM

## 2017-09-01 NOTE — Progress Notes (Signed)
CARDIAC REHAB PHASE I   PRE:  Rate/Rhythm: 85 SR  BP:  Supine: 159/81  Sitting:   Standing:    SaO2: 97%RA  MODE:  Ambulation: 320 ft   POST:  Rate/Rhythm: 112 ST PVCs  BP:  Supine:   Sitting: 157/82  Standing:    SaO2: 98%RA [phone] Notified by RN that pt had stent so I put in consult for Phase 1. Pt walked 320 ft with hand held asst and no CP. Was able to talk and walk without visible SOB. She stated her SOB about the same. Stressed importance of plavix with stent. Reviewed NTG use and healthy food choices. Pt has very little appetite so exceptions given with age and mainly encouraged watching sodium. Encouraged light walking but did not give ex ed or refer to Phase 2 as pt is to return for staged PCI. Did give pt [place] Phase 2 brochure and discussed. Pt stated she would prefer to just walk for ex. Will address again when she returns and make referral then. To recliner with call bell. Pt has been dealing with a lot of stress with death of son last Nov 28, 2023. Emotional support given.   Luetta Nutting, RN BSN  09/01/2017 9:23 AM

## 2017-09-01 NOTE — Discharge Summary (Signed)
Physician Discharge Summary  Patient ID: Catherine Anthony MRN: [id number] DOB/AGE: 04/30/1930 83 y.o.  Admit date: 08/31/2017 Discharge date: 09/01/2017  Admission Diagnoses:  Discharge Diagnoses:  Active Problems:   Aortic stenosis   Coronary artery disease with stable angina pectoris [facility])   Discharged Condition: stable  Hospital Course:   History: (Pre-cath) The patient is a 83 year old female, accompanied by her daughter Catherine Anthony and Catherine Anthony) during the visit, who presents for a follow-up for Shortness of breath. 83 year old female seen by me on 07/26/2017 with complaints of shortness of breath. Her only medical history is hypertension which is well-controlled on 3 agents. On exam I had heard 3/6 systolic murmur with radiation to carotids. Suspecting aortic stenosis, I'll recommended an echocardiogram. Echocardiogram showed normal EF, grade 2 diastolic dysfunction. There was paradoxical atrial flutter low gradient aortic stenosis in the setting of low stroke volume. However it was at best moderate in severity based on the gradient and max velocity of 18 mmHg and 2.4 m/s. But aortic valve area measured at 0.9 cm. There was moderate aortic regurgitation. There was also moderate mitral annular calcification with mild mitral regurgitation.  Hemodynamic data is incongruent with severe aortic stenosis but her symptoms are concerning. This may be a comminution of mixed valvular diseaseand possibly coronary artery disease. Noninvasive stress test may provide information about ischemia but will still need a left and right heart catheterization to evaluate the valve given the discrepancies on echocardiogram. Invasive hemodynamic evaluation would offer information about her filling pressures, valves, as well as her coronary arteries. If she has any moderate coronary artery stenosis, we can perform FFR evaluation to assist the hemodynamic significance. Patient and her family would like to pursue. Minimally  invasive percutaneous treatment options along with medications as opposed to invasive surgery given her age. If I find significant coronary artery disease during the heart catheterization, I will proceed with coronary revascularization if feasible. Based on the invasive hemodynamic data, I will then refer her to Dr. Excell Seltzer or Dr. Clifton James as necessary for TAVR evaluation. I explained to the patient and daughters, that if she has severe aortic stenosis stage D3, her outcomes with potential TAVR would still be better than medical therapy may not be as good as that in patients with severe aortic stenosis stage D1. Medical therapy alone remains an option but patient and daughter would like to proceed with further evaluation to guide management.   Coronary angiogram + angioplasty, RHC/LHC:  Simultaneous LV-AO measurement was performed using Langston catheter. Mean PG was only 20 mmhg, even though AVA was 0.75 cm2. LVEF is normal 55-60% and cardiac output is also normal for her body size.This makes severe stage D3 AS less likely. Symptoms are likely due to multivessel CAD. Syntax score is 11. Patient and family had indicated prior to this procedure that they would prefer percutaneous treatment options over CABG. Her Syntax II score indicates CABG or PCI as equivalent options. Thus, we proceed with plan for staged PCI. LAD and LCx may need atherectomy and PCI which will be performed on another visit in a staged fashion.  Given her radial artery loop, we would perform then LAD and LCX PCI through femoral approach.    Consults: None  Significant Diagnostic Studies: labs: Pending  Treatments:  Conclusion: Moderate calcific aortic and mitral valve stenosis Severe multivessel CAD Staged PCI to mid-prox RCA Successful PTCA and stent placement Resolute Onyx 3.5 X 26 mm DES  Recommend ASA 81 mg and clopidogrel 75 mg daily.  PCI to LAD and LCx will be performed on another visit.   Discharge Exam: Blood  pressure (!) 144/83, pulse 73, temperature 98.7 F (37.1 C), temperature source Oral, resp. rate 15, height  (1.626 m), weight 51.9 kg (114 lb 6.7 oz), SpO2 98 %. Nursing note and vitals reviewed. Constitutional: She is oriented to person, place, and time. She appears well-developed.  Low body weight   HENT:  Head: Normocephalic and atraumatic.  Neck: No JVD present.  Cardiovascular: Normal rate and regular rhythm.   Murmur (III/VI murmur LLSB and sortic area, radiating to carotids. S2 preserved) heard. Rt radial cath site with ecchymosis without significant hematoma. No bleeding. Good capillary refill  Respiratory: Effort normal and breath sounds normal. She has no wheezes. She has no rales.  GI: Soft. Bowel sounds are normal. She exhibits no distension.  Musculoskeletal: She exhibits no edema.  Neurological: She is alert and oriented to person, place, and time.  Skin: Skin is warm and dry.      Disposition: 01-Home or Self Care  Discharge Instructions    Call MD for:  redness, tenderness, or signs of infection (pain, swelling, redness, odor or green/yellow discharge around incision site)    Complete by:  As directed    Call MD for:  severe uncontrolled pain    Complete by:  As directed    Diet - low sodium heart healthy    Complete by:  As directed    Increase activity slowly    Complete by:  As directed      Allergies as of 09/01/2017      Reactions   Codeine Nausea And Vomiting      Medication List    STOP taking these medications   aspirin EC 81 MG tablet Replaced by:  aspirin 81 MG chewable tablet     TAKE these medications   amLODipine 10 MG tablet Commonly known as:  NORVASC Take 5 mg by mouth daily.   aspirin 81 MG chewable tablet Chew 1 tablet (81 mg total) by mouth daily. Replaces:  aspirin EC 81 MG tablet   atenolol 100 MG tablet Commonly known as:  TENORMIN Take 150 mg by mouth daily.   clopidogrel 75 MG tablet Commonly known as:   PLAVIX Take 1 tablet (75 mg total) by mouth daily with breakfast.   lisinopril 40 MG tablet Commonly known as:  PRINIVIL,ZESTRIL Take 40 mg by mouth daily.   rosuvastatin 10 MG tablet Commonly known as:  CRESTOR Take 10 mg by mouth every morning.      Follow-up Information    Elder Negus, MD Follow up on 09/08/2017.   Specialty:  Cardiology Why:  10:30 AM Contact information: [address] [place] [address] [phone]           Signed: Elder Negus 09/01/2017, 8:25 AM   Elder Negus, MD [facility] Pager: [phone] Office: [phone] If no answer Cell [phone]

## 2017-09-08 DIAGNOSIS — R0602 Shortness of breath: Secondary | ICD-10-CM | POA: Diagnosis not present

## 2017-09-08 DIAGNOSIS — I25118 Atherosclerotic heart disease of native coronary artery with other forms of angina pectoris: Secondary | ICD-10-CM | POA: Diagnosis not present

## 2017-09-08 DIAGNOSIS — I10 Essential (primary) hypertension: Secondary | ICD-10-CM | POA: Diagnosis not present

## 2017-09-15 DIAGNOSIS — I25118 Atherosclerotic heart disease of native coronary artery with other forms of angina pectoris: Secondary | ICD-10-CM | POA: Diagnosis not present

## 2017-09-17 NOTE — Progress Notes (Signed)
Labs 09/15/2017: BUN/creatinine 15/1.05.  EGFR 49.  Sodium 139, potassium 4.1. H/H 12.8/36.9.  MCV 93 normal.  Platelets 295. INR 1.0.  

## 2017-09-17 NOTE — H&P (Signed)
Catherine Anthony 09/29/17 8:27 AM Location: [facility] Patient #: [id number] DOB: 19-Jul-1930 Widowed / Language: Catherine Anthony / Race: White Female   History of Present Illness Catherine Leep MD; 2017/09/29 12:42 PM) Patient words: Last OV 08/12/2017; FU cath.  The patient is a 83 year old female who presents for a Follow-up for Shortness of breath. Pleasant 83 year old female with hypertension, moderate aortic stenosis with moderate aortic regurgitation and mitral calcification. Her echocardiogram had conflicting findings regarding rest aortic stenosis with maximum velocity of 2.4 m/s, mean gradient of 80 mmHg, and valve area of 0.97 m. She had significant dyspnea on exertion. Even her findings I recommended coronary angiogram with aortic valve area measurement and coronary angioplasty as necessary.  Simultaneous LV-AO measurement was performed using Langston catheter. Mean PG was only 20 mmhg, even though AVA was 0.75 cm2. LVEF is normal 55-60% and cardiac output is also normal for her body size.This makes severe stage D3 AS less likely. Symptoms are likely due to multivessel CAD. Syntax score was 11. Patient and family had indicated prior to this procedure that they would prefer percutaneous treatment options over CABG. Her Syntax II score indicates CABG or PCI as equivalent options. Thus, we proceeded with plan for staged PCI.  He performed PCI to her proximal to mid RCA 4 severe 80% stenosis with a resolute on next 3.5 and 26m for drug-eluting stent. We planned to perform LAD and circumflex PCI in a staged manner, possibly with atherectomy. Given significant right radial artery loop we decided to perform this from femoral artery on a later date.  She is here today for 1 week follow-up after her RCA PCI, with her daughter Catherine Anthony In the last 2-3 days, she has had some improvement in her shortness of breath and fatigue but it still persists. She denies any chest pain. She's not  had any significant bleeding or bruising other than the initial right forearm ecchymosis that she had in the hospital. She has been out of her atenolol for the last 2 days.   Problem List/Past Medical (April Harrington; 1Oct 31, 201810:50 AM) Shortness of breath (R06.02)  Benign essential hypertension (I10)  Laboratory examination (Z01.89)  Labs 07/26/2017: WBC 7.9. Hemoglobin 13.6, hematocrit 40.2. Platelets 315. MCV microcytic 98. Glucose 115. BUN 18, creatinine 1.03. EGFR 50. Sodium 138 potassium 4.9. Calcium 10.4 (8.7-10.3) BMP 142.5 Labs 01/19/2017: WBC 4.8. Hemoglobin 14.5, hematocrit 42.2. MCV 5.7 normocytic. Glucose 25, BUN 18, creatinine 0.8, EGFR 77-93 Total protein 7.0, albumin 3.6. Normal liver enzymes. Sodium 138 potassium 4.3. 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 PCI to LAD and LCx will be performed on another visit. Recommend ASA 81 mg and clopidogrel 75 mg daily. PCI to LAD and LCx will be performed on another visit.  Allergies (April Harrington; 110-31-1810:50 AM) Codeine Sulfate *ANALGESICS - OPIOID*  Nausea. Lacri-Lube *OPHTHALMIC AGENTS*  Itching, Swelling.  Family History (April Harrington; 110/31/1810:50 AM) Mother  Deceased. at age 81 unsure Father  Deceased. at age 81 MI (first one at age 81  Sister 5  1 older; no heart issues Brother 5  older; no heart issues; 1 died from car accident; cancer  Social History (April Harrington; 09/08/2017  11:02 AM) Current tobacco use  Never smoker. Non Drinker/No Alcohol Use  Marital status  Widowed. Living Situation  Lives with relatives. Number of Children  5. 1 passed away in 2017/12/20from malignant tumor  Past Surgical History (April Harrington; 09/08/2017 10:50 AM) None [07/26/2017]:  Medication History (April Harrington; 09/08/2017 11:05 AM) Aspirin 81 (81MG Tablet Chewable, 1 (one) Tablet Oral once daily 30 minutes before meal, Taken starting 08/12/2017) Active. AmLODIPine Besylate (10MG Tablet, 0.5 Oral daily) Active. Lisinopril (40MG Tablet, 1 Oral daily) Active. Rosuvastatin Calcium (10MG Tablet, 1 (one) Tablet Oral once daily, Taken starting 08/12/2017) Active. Clopidogrel Bisulfate (75MG Tablet, 1 Oral daily) Active. Atenolol (100MG Tablet, 1.5 Oral daily) Active. Medications Reconciled (verbally with pt; medication present)  Diagnostic Studies History Anderson Malta Sergeant; 09/08/2017 8:29 AM) Coronary Angiogram [09/01/2017]: Coronary angioram, LHC/RHC, RCA PCI Moderate calcific aortic and mitral valve stenosis Severe multivessel CAD Staged PCI to mid-prox RCA Successful PTCA and stent placement Resolute Onyx 3.5 X 26 mm DES Moderate calcific aortic and mitral valve stenosis Severe multivessel CAD Staged PCI to mid-prox RCA Successful PTCA and stent placement Resolute Onyx 3.5 X 26 mm DES  Recommend ASA 81 mg and clopidogrel 75 mg daily. PCI to LAD and LCx will be performed on another visit. Recommend ASA 81 mg and clopidogrel 75 mg daily. PCI to LAD and LCx will be performed on another visit.    Review of Systems Joya Gaskins Rodneisha Bonnet MD; 09/08/2017 12:42 PM) General Not Present- Appetite Loss and Weight Gain. Respiratory Not Present- Chronic Cough and Wakes up from Sleep Wheezing or Short of Breath. Cardiovascular Present- Difficulty Breathing On Exertion (slightly improved) and Hypertension. Not Present- Chest Pain, Difficulty Breathing Lying Down,  Edema, Fainting and Orthopnea. Gastrointestinal Not Present- Black, Tarry Stool and Difficulty Swallowing. Musculoskeletal Not Present- Decreased Range of Motion and Muscle Atrophy. Neurological Not Present- Attention Deficit. Psychiatric Not Present- Personality Changes and Suicidal Ideation. Endocrine Not Present- Cold Intolerance and Heat Intolerance. Hematology Not Present- Abnormal Bleeding. All other systems negative  Vitals (April Harrington; 09/08/2017 11:06 AM) 09/08/2017 11:00 AM Weight: 115.19 lb Height: 64in Body Surface Area: 1.55 m Body Mass Index: 19.77 kg/m  Pulse: 74 (Regular)  P.OX: 97% (Room air) BP: 142/90 (Sitting, Left Arm, Standard)       Physical Exam Joya Gaskins Elver Stadler MD; 09/08/2017 12:51 PM) General Mental Status-Alert. General Appearance-Cooperative, Appears stated age, Not in acute distress. Build & Nutrition-Moderately built.  Head and Neck Thyroid Gland Characteristics - no palpable nodules, no palpable enlargement.  Chest and Lung Exam Palpation Tender - No chest wall tenderness. Auscultation Breath sounds - Clear.  Cardiovascular Inspection Jugular vein - Right - No Distention. Auscultation Rhythm - Regular. Heart Sounds - S1 WNL and No gallop present. Note: Single S2. Murmurs & Other Heart Sounds: Murmur - Location - Aortic Area. Timing - Holosystolic. Grade - III/VI. Character - Crescendo/Decrescendo. Radiation - Right carotid and Left carotid.  Abdomen Palpation/Percussion Normal exam - Non Tender and No hepatosplenomegaly. Auscultation Normal exam - Bowel sounds normal.  Peripheral Vascular Lower Extremity Inspection - Bilateral - Inspection Normal. Palpation - Edema - Bilateral - No edema. Femoral pulse - Bilateral - 2+. Popliteal pulse - Bilateral - Normal. Dorsalis pedis pulse - Left - 2+. Right - 1+. Posterior tibial pulse - Left - 2+. Right - 2+. Bilateral - Normal. Lower  Extremity Inspection - Right -  Note: Forearm ecchymosis, improved since hospital stay. No hematoma. 2+ radial artery pulse with excellent capillary refill. Carotid arteries - Bilateral-No Carotid bruit. Abdomen-No prominent abdominal aortic pulsation, No epigastric bruit.  Neurologic Neurologic evaluation reveals -alert and oriented x 3 with no impairment of recent or remote memory. Motor-Grossly intact without any focal deficits.  Musculoskeletal Global Assessment Left Lower Extremity - normal range of motion without pain. Right Lower Extremity - normal range of motion without pain.    Assessment & Plan Joya Gaskins Najla Aughenbaugh MD; 09/08/2017 12:50 PM) Shortness of breath on exertion (R06.02) Story: Echocardiogram 08/05/2017: Left ventricle cavity is small. Normal global wall motion. Visual EF is 55-60%. Doppler evidence of grade II (pseudonormal) diastolic dysfunction. Diastolic dysfunction findings suggests elevated LA/LV end diastolic pressure. Left atrial cavity is mildly dilated by volume. Right atrial cavity is mildly dilated. Probably trileaflet aortic valve with moderate aortic stenosis (Paradoxically low flow- low gradient) in the setting of low stroke volume index. Moderate (Grade II) aortic regurgitation. Moderate mitral annular and leaflet calcification. Mild functional stenosis. Mild (Grade I) mitral regurgitation. Moderate-severe tricuspid regurgitation. Mild to moderate pulmonary hypertension. Pulmonary artery systolic pressure is estimated at 35-40 mm Hg. Current Plans Started Atenolol 100MG, 1 Tablet daily, #60, 60 days starting 09/08/2017, Ref. x3. Hypertension, benign (I10) Story: EKG 09/08/2017: Sinus rhythm 68 bpm. Normal axis. Normal conduction. Less than 1 mm ST depression in lateral leads. Likely nonspecific. 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 PCI to LAD and LCx will be performed on another visit. Recommend ASA 81 mg and clopidogrel 75 mg daily. PCI to LAD and LCx will be performed on another visit. Future Plans 62/22/9798: METABOLIC PANEL, BASIC (92119) - one time 09/13/2017: CBC & PLATELETS (AUTO) (41740) - one time 09/13/2017: PT (PROTHROMBIN TIME) (81448) - one time Note:.  Recommendations:  Moderate aortic stenosis with mean pressure gradient of 18 mmHg on catheter as well as echocardiogram. Severe multivessel coronary artery disease with low syntax score. She underwent successful RCA PCI last week. I've discussed the option of LAD and circumflex PCI with atherectomy in detail with patient and daughter.  Schedule for cardiac catheterization, and possible angioplasty. We discussed regarding risks, benefits, alternatives to this including stress testing, CTA and continued medical therapy. Patient wants to proI discuss the risks and benefits of atherectomy in severely calcified coronary artery disease. She understands <2-3% risk of death, stroke, MI, urgent CABG, bleeding, infection, renal failure but not limited to these. This was a greater than 40 minute office visit with greater than 50% of the time spent with face-to-face encounter with patient and evaluation of complex medical issues, review of external records and coordination of care.  Continue ASA/plavix, atenolol 100 mg daily, rosuvstatin, amlodipine, lisinopril.  Will plan to perform this on 09/21/2017 with plan for overnight hoispital stay. We will use Rt femoral  acccess given right radial artery loop.   Cc Deland Pretty, MD  Signed electronically by Catherine Leep, MD (09/08/2017 12:52 PM)

## 2017-09-21 ENCOUNTER — Ambulatory Visit (HOSPITAL_COMMUNITY)
Admission: RE | Admit: 2017-09-21 | Discharge: 2017-09-22 | Disposition: A | Discharge status: Home / Self Care | Payer: Medicare Other | Source: Ambulatory Visit | Attending: Cardiology | Admitting: Cardiology

## 2017-09-21 ENCOUNTER — Encounter (HOSPITAL_COMMUNITY)
Admission: RE | Disposition: A | Discharge status: Home / Self Care | Payer: Self-pay | Source: Ambulatory Visit | Attending: Cardiology

## 2017-09-21 ENCOUNTER — Encounter (HOSPITAL_COMMUNITY): Payer: Self-pay | Admitting: Cardiology

## 2017-09-21 ENCOUNTER — Other Ambulatory Visit: Payer: Self-pay

## 2017-09-21 DIAGNOSIS — Z7902 Long term (current) use of antithrombotics/antiplatelets: Secondary | ICD-10-CM | POA: Diagnosis not present

## 2017-09-21 DIAGNOSIS — I080 Rheumatic disorders of both mitral and aortic valves: Secondary | ICD-10-CM | POA: Insufficient documentation

## 2017-09-21 DIAGNOSIS — I10 Essential (primary) hypertension: Secondary | ICD-10-CM | POA: Diagnosis not present

## 2017-09-21 DIAGNOSIS — I25118 Atherosclerotic heart disease of native coronary artery with other forms of angina pectoris: Secondary | ICD-10-CM | POA: Diagnosis not present

## 2017-09-21 DIAGNOSIS — I2584 Coronary atherosclerosis due to calcified coronary lesion: Secondary | ICD-10-CM | POA: Diagnosis not present

## 2017-09-21 DIAGNOSIS — Z7982 Long term (current) use of aspirin: Secondary | ICD-10-CM | POA: Diagnosis not present

## 2017-09-21 DIAGNOSIS — Z955 Presence of coronary angioplasty implant and graft: Secondary | ICD-10-CM

## 2017-09-21 DIAGNOSIS — Z9861 Coronary angioplasty status: Secondary | ICD-10-CM

## 2017-09-21 HISTORY — PX: ULTRASOUND GUIDANCE FOR VASCULAR ACCESS: SHX6516

## 2017-09-21 HISTORY — PX: CORONARY ATHERECTOMY: CATH118238

## 2017-09-21 HISTORY — PX: CORONARY STENT INTERVENTION: CATH118234

## 2017-09-21 HISTORY — PX: INTRAVASCULAR ULTRASOUND/IVUS: CATH118244

## 2017-09-21 HISTORY — DX: Migraine, unspecified, not intractable, without status migrainosus: G43.909

## 2017-09-21 HISTORY — DX: Pure hypercholesterolemia, unspecified: E78.00

## 2017-09-21 LAB — POCT ACTIVATED CLOTTING TIME
ACTIVATED CLOTTING TIME: 274 s
ACTIVATED CLOTTING TIME: 274 s
ACTIVATED CLOTTING TIME: 422 s
ACTIVATED CLOTTING TIME: 224 s
ACTIVATED CLOTTING TIME: 202 s
Activated Clotting Time: 516 seconds
Activated Clotting Time: 241 seconds
Activated Clotting Time: 241 seconds
Activated Clotting Time: 186 seconds

## 2017-09-21 SURGERY — CORONARY STENT INTERVENTION
Anesthesia: LOCAL

## 2017-09-21 MED ORDER — FENTANYL CITRATE (PF) 100 MCG/2ML IJ SOLN
INTRAMUSCULAR | Status: AC
  Filled 2017-09-21: qty 4

## 2017-09-21 MED ORDER — HEPARIN SODIUM (PORCINE) 1000 UNIT/ML IJ SOLN
INTRAMUSCULAR | Status: AC
  Filled 2017-09-21: qty 1

## 2017-09-21 MED ORDER — LISINOPRIL 40 MG PO TABS
40.0000 mg | ORAL_TABLET | Freq: Every day | ORAL | Status: DC
  Administered 2017-09-22: 40 mg via ORAL
  Filled 2017-09-21: qty 1

## 2017-09-21 MED ORDER — IOPAMIDOL (ISOVUE-370) INJECTION 76%
INTRAVENOUS | Status: DC | PRN
Start: 2017-09-21 — End: 2017-09-21
  Administered 2017-09-21: 325 mL via INTRA_ARTERIAL

## 2017-09-21 MED ORDER — FENTANYL CITRATE (PF) 100 MCG/2ML IJ SOLN
INTRAMUSCULAR | Status: DC | PRN
Start: 2017-09-21 — End: 2017-09-21
  Administered 2017-09-21 (×2): 25 ug via INTRAVENOUS

## 2017-09-21 MED ORDER — LABETALOL HCL 5 MG/ML IV SOLN: 10.0000 mg | INTRAVENOUS | Status: AC | PRN

## 2017-09-21 MED ORDER — SODIUM CHLORIDE 0.9 % IV SOLN: 250.0000 mL | INTRAVENOUS | Status: DC | PRN

## 2017-09-21 MED ORDER — VERAPAMIL HCL 2.5 MG/ML IV SOLN
INTRAVENOUS | Status: DC | PRN
  Administered 2017-09-21: 300 ug via INTRACORONARY

## 2017-09-21 MED ORDER — CLOPIDOGREL BISULFATE 75 MG PO TABS: 75.0000 mg | ORAL_TABLET | Freq: Once | ORAL | Status: DC

## 2017-09-21 MED ORDER — HEPARIN (PORCINE) IN NACL 2-0.9 UNIT/ML-% IJ SOLN
INTRAMUSCULAR | Status: AC
  Filled 2017-09-21: qty 1500

## 2017-09-21 MED ORDER — SODIUM CHLORIDE 0.9 % IV SOLN
INTRAVENOUS | Status: DC | PRN
  Administered 2017-09-21: 08:00:00 via SURGICAL_CAVITY

## 2017-09-21 MED ORDER — DIPHENHYDRAMINE HCL 25 MG PO TABS
25.0000 mg | ORAL_TABLET | Freq: Every day | ORAL | Status: DC | PRN
Start: 2017-09-21 — End: 2017-09-22
  Filled 2017-09-21: qty 1

## 2017-09-21 MED ORDER — ASPIRIN 81 MG PO CHEW: 81.0000 mg | CHEWABLE_TABLET | ORAL | Status: DC

## 2017-09-21 MED ORDER — ANGIOPLASTY BOOK
Freq: Once | Status: AC
  Administered 2017-09-21: 20:00:00
  Filled 2017-09-21: qty 1

## 2017-09-21 MED ORDER — ROSUVASTATIN CALCIUM 10 MG PO TABS
10.0000 mg | ORAL_TABLET | Freq: Every day | ORAL | Status: DC
  Administered 2017-09-22: 10 mg via ORAL
  Filled 2017-09-21: qty 1

## 2017-09-21 MED ORDER — ONDANSETRON HCL 4 MG/2ML IJ SOLN: 4.0000 mg | Freq: Four times a day (QID) | INTRAMUSCULAR | Status: DC | PRN

## 2017-09-21 MED ORDER — IOPAMIDOL (ISOVUE-370) INJECTION 76%
INTRAVENOUS | Status: AC
  Filled 2017-09-21: qty 125

## 2017-09-21 MED ORDER — HYDRALAZINE HCL 20 MG/ML IJ SOLN: 5.0000 mg | INTRAMUSCULAR | Status: AC | PRN

## 2017-09-21 MED ORDER — SODIUM CHLORIDE 0.9% FLUSH: 3.0000 mL | INTRAVENOUS | Status: DC | PRN

## 2017-09-21 MED ORDER — MIDAZOLAM HCL 2 MG/2ML IJ SOLN
INTRAMUSCULAR | Status: DC | PRN
  Administered 2017-09-21 (×2): 1 mg via INTRAVENOUS

## 2017-09-21 MED ORDER — ASPIRIN 81 MG PO CHEW
81.0000 mg | CHEWABLE_TABLET | Freq: Every day | ORAL | Status: DC
  Administered 2017-09-22: 81 mg via ORAL
  Filled 2017-09-21: qty 1

## 2017-09-21 MED ORDER — IOPAMIDOL (ISOVUE-370) INJECTION 76%
INTRAVENOUS | Status: AC
  Filled 2017-09-21: qty 100

## 2017-09-21 MED ORDER — AMLODIPINE BESYLATE 5 MG PO TABS
5.0000 mg | ORAL_TABLET | Freq: Every day | ORAL | Status: DC
  Administered 2017-09-22: 5 mg via ORAL
  Filled 2017-09-21: qty 1

## 2017-09-21 MED ORDER — ATENOLOL 100 MG PO TABS
100.0000 mg | ORAL_TABLET | Freq: Every day | ORAL | Status: DC
  Administered 2017-09-22: 100 mg via ORAL
  Filled 2017-09-21: qty 2
  Filled 2017-09-21: qty 1

## 2017-09-21 MED ORDER — NITROGLYCERIN 1 MG/10 ML FOR IR/CATH LAB
INTRA_ARTERIAL | Status: AC
  Filled 2017-09-21: qty 10

## 2017-09-21 MED ORDER — ENSURE ENLIVE PO LIQD
237.0000 mL | Freq: Two times a day (BID) | ORAL | Status: DC
  Filled 2017-09-21 (×5): qty 237

## 2017-09-21 MED ORDER — ACETAMINOPHEN 325 MG PO TABS
650.0000 mg | ORAL_TABLET | ORAL | Status: DC | PRN
  Administered 2017-09-21 – 2017-09-22 (×2): 650 mg via ORAL
  Filled 2017-09-21 (×2): qty 2

## 2017-09-21 MED ORDER — CLOPIDOGREL BISULFATE 75 MG PO TABS
75.0000 mg | ORAL_TABLET | Freq: Every day | ORAL | Status: DC
  Administered 2017-09-22: 09:00:00 75 mg via ORAL
  Filled 2017-09-21: qty 1

## 2017-09-21 MED ORDER — SODIUM CHLORIDE 0.9% FLUSH: 3.0000 mL | Freq: Two times a day (BID) | INTRAVENOUS | Status: DC

## 2017-09-21 MED ORDER — MIDAZOLAM HCL 2 MG/2ML IJ SOLN
INTRAMUSCULAR | Status: AC
  Filled 2017-09-21: qty 2

## 2017-09-21 MED ORDER — HEPARIN SODIUM (PORCINE) 1000 UNIT/ML IJ SOLN
INTRAMUSCULAR | Status: DC | PRN
  Administered 2017-09-21: 2000 [IU] via INTRAVENOUS
  Administered 2017-09-21: 5000 [IU] via INTRAVENOUS
  Administered 2017-09-21 (×2): 2000 [IU] via INTRAVENOUS

## 2017-09-21 MED ORDER — VERAPAMIL HCL 2.5 MG/ML IV SOLN
INTRAVENOUS | Status: AC
  Filled 2017-09-21: qty 2

## 2017-09-21 MED ORDER — HEPARIN (PORCINE) IN NACL 2-0.9 UNIT/ML-% IJ SOLN
INTRAMUSCULAR | Status: AC | PRN
  Administered 2017-09-21: 1000 mL

## 2017-09-21 MED ORDER — SODIUM CHLORIDE 0.9 % IV SOLN
INTRAVENOUS | Status: DC
  Administered 2017-09-21 (×2): via INTRAVENOUS

## 2017-09-21 MED ORDER — LIDOCAINE HCL 2 % IJ SOLN
INTRAMUSCULAR | Status: AC
  Filled 2017-09-21: qty 10

## 2017-09-21 MED ORDER — NITROGLYCERIN 1 MG/10 ML FOR IR/CATH LAB
INTRA_ARTERIAL | Status: DC | PRN
  Administered 2017-09-21: 100 ug via INTRACORONARY
  Administered 2017-09-21 (×2): 200 ug via INTRACORONARY
  Administered 2017-09-21: 100 ug via INTRACORONARY
  Administered 2017-09-21: 200 ug via INTRACORONARY

## 2017-09-21 MED ORDER — LIDOCAINE HCL (PF) 1 % IJ SOLN
INTRAMUSCULAR | Status: DC | PRN
  Administered 2017-09-21: 5 mL

## 2017-09-21 SURGICAL SUPPLY — 30 items
BALLN SAPPHIRE 2.5X10 (BALLOONS) ×3
BALLN SPRINT LEG OTW 1.25X6 (BALLOONS) ×3
BALLOON SAPPHIRE 2.5X10 (BALLOONS) IMPLANT
BALLOON SPRINT LEG OTW 1.25X6 (BALLOONS) ×1 IMPLANT
CATH OPTICROSS 40MHZ (CATHETERS) ×1 IMPLANT
CATH VISTA GUIDE 6FR XB3.5 (CATHETERS) ×2 IMPLANT
COVER PRB 48X5XTLSCP FOLD TPE (BAG) IMPLANT
COVER PROBE 5X48 (BAG) ×3
CROWN DIAMONDBACK CLASSIC 1.25 (BURR) ×1 IMPLANT
ELECT DEFIB PAD ADLT CADENCE (PAD) ×1 IMPLANT
KIT ENCORE 26 ADVANTAGE (KITS) ×1 IMPLANT
KIT HEART LEFT (KITS) ×3 IMPLANT
KIT MICROINTRODUCER STIFF 5F (SHEATH) ×1 IMPLANT
LUBRICANT VIPERSLIDE CORONARY (MISCELLANEOUS) ×1 IMPLANT
PACK CARDIAC CATHETERIZATION (CUSTOM PROCEDURE TRAY) ×3 IMPLANT
PINNACLE LONG 6F 25CM (SHEATH) ×3
SHEATH INTRO PINNACLE 6F 25CM (SHEATH) IMPLANT
SHEATH PINNACLE 6F 10CM (SHEATH) ×1 IMPLANT
SLED PULL BACK IVUS (MISCELLANEOUS) ×2 IMPLANT
STENT SYNERGY DES 2.5X12 (Permanent Stent) ×1 IMPLANT
STENT SYNERGY DES 2.5X20 (Permanent Stent) ×1 IMPLANT
STENT SYNERGY DES 3.5X8 (Permanent Stent) ×1 IMPLANT
STENT SYNERGY DES 3X20 (Permanent Stent) ×1 IMPLANT
STENT SYNERGY DES 3X24 (Permanent Stent) ×1 IMPLANT
TRANSDUCER W/STOPCOCK (MISCELLANEOUS) ×3 IMPLANT
TUBING CIL FLEX 10 FLL-RA (TUBING) ×3 IMPLANT
VALVE GUARDIAN II ~~LOC~~ HEMO (MISCELLANEOUS) ×1 IMPLANT
WIRE COUGAR XT STRL 300CM (WIRE) ×1 IMPLANT
WIRE EMERALD 3MM-J .035X150CM (WIRE) ×1 IMPLANT
WIRE VIPER ADVANCE COR .012TIP (WIRE) ×1 IMPLANT

## 2017-09-21 NOTE — Progress Notes (Signed)
Patients 6 fr right femoral arterial sheath was pulled per order. Manual pressure was held for 20 minutes. Patient tolerated pull well. Sheath site during and post sheath pull was clean, dry, intact and had no sign of a hematoma. Patients VS remained WNL's during and post sheath pull. Patients bedrest began at 1240 and ends in 4 fours at 11640. Vascular site assessment was a level 0. Sheath removal site was dressed with a gauze dressing and was clean, dry, and intact. Patient was educated about post sheath pull management and stated he understood and had no questions.

## 2017-09-21 NOTE — H&P (View-Only) (Signed)
Labs 09/15/2017: BUN/creatinine 15/1.05.  EGFR 49.  Sodium 139, potassium 4.1. H/H 12.8/36.9.  MCV 93 normal.  Platelets 295. INR 1.0.

## 2017-09-21 NOTE — Interval H&P Note (Signed)
History and Physical Interval Note:  09/21/2017 7:31 AM  Jalaiya Lavonna RuaM Davern  has presented today for surgery, with the diagnosis of cad  The various methods of treatment have been discussed with the patient and family. After consideration of risks, benefits and other options for treatment, the patient has consented to  Procedure(s): CORONARY STENT INTERVENTION (N/A) as a surgical intervention .  The patient's history has been reviewed, patient examined, no change in status, stable for surgery.  I have reviewed the patient's chart and labs.  Questions were answered to the patient's satisfaction.    1 Vessel Disease PCI CABG   No proximal LAD involvement, No proximal left dominant LCX involvement Not rated Not rated   Proximal left dominant LCX involvement Not rated Not rated   Proximal LAD involvement Not rated Not rated   newline 2 Vessel Disease  No proximal LAD involvement Not rated Not rated   Proximal LAD involvement Not rated Not rated   newline 3 Vessel Disease  Low disease complexity (e.g., focal stenoses, SYNTAX <=22) Not rated Not rated   Intermediate or high disease complexity (e.g., SYNTAX >=23) Not rated Not rated   newline Left Main Disease  Isolated LMCA disease: ostial or midshaft A (7); Indication 24 A (9); Indication 24   Isolated LMCA disease: bifurcation involvement M (6); Indication 25 A (9); Indication 25   LMCA ostial or midshaft, concurrent low disease burden multivessel disease (e.g., 1-2 additional focal stenoses, SYNTAX <=22) A (7); Indication 26 A (9); Indication 26   LMCA ostial or midshaft, concurrent intermediate or high disease burden multivessel disease (e.g., 1-2 additional bifurcation stenoses, long stenoses, SYNTAX >=23) M (4); Indication 27 A (9); Indication 27   LMCA bifurcation involvement, concurrent low disease burden multivessel disease (e.g., 1-2 additional focal stenoses, SYNTAX <=22) M (6); Indication 28 A (9); Indication 28   LMCA bifurcation  involvement, concurrent intermediate or high disease burden multivessel disease (e.g., 1-2 additional bifurcation stenoses, long stenoses, SYNTAX >=23) R (3); Indication 29 A (9); Indication 29       Coronary anatomy known from previous catheterization showing angiographically severe LAD and LCx disease. Low Syntax score. Preserved LVEF. Discussion regarding PCI vs CABG prior to the case. Patient prefers PCI.  Manish J Patwardhan

## 2017-09-22 DIAGNOSIS — I2584 Coronary atherosclerosis due to calcified coronary lesion: Secondary | ICD-10-CM | POA: Diagnosis not present

## 2017-09-22 DIAGNOSIS — I25118 Atherosclerotic heart disease of native coronary artery with other forms of angina pectoris: Secondary | ICD-10-CM | POA: Diagnosis not present

## 2017-09-22 DIAGNOSIS — Z7902 Long term (current) use of antithrombotics/antiplatelets: Secondary | ICD-10-CM | POA: Diagnosis not present

## 2017-09-22 DIAGNOSIS — I10 Essential (primary) hypertension: Secondary | ICD-10-CM | POA: Diagnosis not present

## 2017-09-22 DIAGNOSIS — Z7982 Long term (current) use of aspirin: Secondary | ICD-10-CM | POA: Diagnosis not present

## 2017-09-22 DIAGNOSIS — I080 Rheumatic disorders of both mitral and aortic valves: Secondary | ICD-10-CM | POA: Diagnosis not present

## 2017-09-22 LAB — BASIC METABOLIC PANEL
Anion gap: 7 (ref 5–15)
BUN: 11 mg/dL (ref 6–20)
CHLORIDE: 107 mmol/L (ref 101–111)
CO2: 21 mmol/L — AB (ref 22–32)
CREATININE: 0.72 mg/dL (ref 0.44–1.00)
Calcium: 8.4 mg/dL — ABNORMAL LOW (ref 8.9–10.3)
GFR calc Af Amer: >60 mL/min (ref >60)
GFR calc non Af Amer: >60 mL/min (ref >60)
Glucose, Bld: 94 mg/dL (ref 65–99)
POTASSIUM: 3 mmol/L — AB (ref 3.5–5.1)
SODIUM: 135 mmol/L (ref 135–145)

## 2017-09-22 LAB — CBC
HEMATOCRIT: 32.8 % — AB (ref 36.0–46.0)
Hemoglobin: 10.9 g/dL — ABNORMAL LOW (ref 12.0–15.0)
MCH: 31.7 pg (ref 26.0–34.0)
MCHC: 33.2 g/dL (ref 30.0–36.0)
MCV: 95.3 fL (ref 78.0–100.0)
Platelets: 229 10*3/uL (ref 150–400)
RBC: 3.44 MIL/uL — AB (ref 3.87–5.11)
RDW: 12.6 % (ref 11.5–15.5)
WBC: 7.2 10*3/uL (ref 4.0–10.5)

## 2017-09-22 MED ORDER — POTASSIUM CHLORIDE CRYS ER 20 MEQ PO TBCR: 40.0000 meq | EXTENDED_RELEASE_TABLET | Freq: Every day | ORAL | 3 refills | Status: DC

## 2017-09-22 MED ORDER — POTASSIUM CHLORIDE CRYS ER 20 MEQ PO TBCR
40.0000 meq | EXTENDED_RELEASE_TABLET | Freq: Every day | ORAL | Status: DC
  Administered 2017-09-22: 09:00:00 40 meq via ORAL
  Filled 2017-09-22: qty 2

## 2017-09-22 MED FILL — Heparin Sodium (Porcine) 2 Unit/ML in Sodium Chloride 0.9%: INTRAMUSCULAR | Qty: 500 | Status: AC

## 2017-09-22 MED FILL — Lidocaine HCl Local Inj 2%: INTRAMUSCULAR | Qty: 10 | Status: AC

## 2017-09-22 NOTE — Care Management Note (Signed)
Case Management Note  Patient Details  Name: Catherine Anthony MRN: [id number] Date of Birth: 09/17/1930  Subjective/Objective:   From home with daughter , Lura Ematsy.  NCM spoke with them both and daughter and patient states she is indep at home, will not need any HH services.  She is s/p coronary stent intervention, will be on plavix.  For dc today,no needs.                 Action/Plan: DC home no needs.  Expected Discharge Date:  09/22/17               Expected Discharge Plan:  Home/Self Care  In-House Referral:     Discharge planning Services  CM Consult  Post Acute Care Choice:    Choice offered to:     DME Arranged:    DME Agency:     HH Arranged:    HH Agency:     Status of Service:  Completed, signed off  If discussed at [employer] Length of Stay Meetings, dates discussed:    Additional Comments:  Leone Havenaylor, Evynn Boutelle Clinton, RN 09/22/2017, 9:44 AM

## 2017-09-22 NOTE — Progress Notes (Signed)
CARDIAC REHAB PHASE I   PRE:  Rate/Rhythm: 77 SR with PACs    BP: sitting 176/70    SaO2:   MODE:  Ambulation: 440 ft   POST:  Rate/Rhythm: 112 ST with many PACS/PVCs    BP: sitting 188/98     SaO2:   Pt steady, no c/o (except heel pain from old injury). Upon standing pt HR up  To 110s, many PACS, almost looked like afib. Remained irregular entire walk, pt asx. HR decreased back to 70s SR with sitting. BP elevated. Ed completed/reviewed with pt and daughter. Understand importance of Plavix. [facility]. Encouraged increasing walking daily.  [id number]   Harriet MassonRandi Kristan Miho Monda CES, ACSM 09/22/2017 9:36 AM

## 2017-09-22 NOTE — Discharge Summary (Signed)
Physician Discharge Summary  Patient ID: Catherine Anthony MRN: [id number] DOB/AGE: [birth year] 83 y.o.  Admit date: 09/21/2017 Discharge date: 09/22/2017  Admission Diagnoses: Coronary artery disease s/p multivessel staged PCI Moderate aortic stenosis  Discharge Diagnoses:  Active Problems:   Coronary artery disease with stable angina pectoris (HCC)   Post PTCA   Discharged Condition: good  Hospital Course:  83 y/o [name] with CAD s/p PCI to RCA on 09/01/2017, moderate aortic stenosis, underwent successful two vessel PCI with atherectomy to LAD and LCx. She has done well post PCI. Hb is low without any bleeding or hematoma. This is likely due to hemodilution. She is referred to cardiac rehab. Follow up with me on 09/30/2017. Will check repeat CBC, BMP that day.   Consults: None  Significant Diagnostic Studies: See below Results for Catherine Anthony, Catherine Anthony (MRN [id number]) as of 09/22/2017 08:35  Ref. Range 09/01/2017 08:12 09/22/2017 02:06  WBC Latest Ref Range: 4.0 - 10.5 K/uL 7.7 7.2  RBC Latest Ref Range: 3.87 - 5.11 MIL/uL 4.27 3.44 (L)  Hemoglobin Latest Ref Range: 12.0 - 15.0 g/dL 78.2 95.6 (L)  HCT Latest Ref Range: 36.0 - 46.0 % 40.4 32.8 (L)  MCV Latest Ref Range: 78.0 - 100.0 fL 94.6 95.3  MCH Latest Ref Range: 26.0 - 34.0 pg 31.4 31.7  MCHC Latest Ref Range: 30.0 - 36.0 g/dL 21.3 08.6  RDW Latest Ref Range: 11.5 - 15.5 % 12.2 12.6  Platelets Latest Ref Range: 150 - 400 K/uL 275 229   Results for Catherine Anthony, Catherine Anthony (MRN [id number]) as of 09/22/2017 08:35  Ref. Range 09/01/2017 08:12 09/22/2017 02:06  BASIC METABOLIC PANEL Unknown Rpt (A) Rpt (A)  Sodium Latest Ref Range: 135 - 145 mmol/L 135 135  Potassium Latest Ref Range: 3.5 - 5.1 mmol/L 3.4 (L) 3.0 (L)  Chloride Latest Ref Range: 101 - 111 mmol/L 104 107  CO2 Latest Ref Range: 22 - 32 mmol/L 23 21 (L)  Glucose Latest Ref Range: 65 - 99 mg/dL 85 94  BUN Latest Ref Range: 6 - 20 mg/dL 13 11  Creatinine Latest Ref Range: 0.44 -  1.00 mg/dL 5.28 4.13  Calcium Latest Ref Range: 8.9 - 10.3 mg/dL 9.0 8.4 (L)  Anion gap Latest Ref Range: 5 - 15  8 7   GFR, Est Non African American Latest Ref Range: >60 mL/min >60 >60  GFR, Est African American Latest Ref Range: >60 mL/min >60 >60   Treatments:  Conclusion   Successful diamondback orbital atherectomy and two vessel PCI PTCA and overlapping drug-eluting stents placement LCx     Synergy DES 2.5 X 12 mm     Synergy DES 2.5 X 20 mm     Synergy DES 3.0 X 20 mm PTCA and overlapping drug-eluting stents placement LAD     Synergy DES 3.0 X 24 mm     Synergy DES 3.5 X 8 mm  Recommendation: Continue ASA 81 mg lifelong and clopidogrel 75 mg at least for 6 months Admit overnight for monitoring. Sheath removal as per protocol      Discharge Exam: Blood pressure (!) 157/71, pulse 70, temperature 98.8 F (37.1 C), temperature source Oral, resp. rate 19, height 5\' 4"  (1.626 m), weight 52.2 kg (115 lb), SpO2 97 %. Nursing note and vitals reviewed. Constitutional: She is oriented to person, place, and time. She appears well-developed.  Low body weight   HENT:  Head: Normocephalic and atraumatic.  Neck: No JVD present.  Cardiovascular: Normal rate and regular rhythm.  Murmur (III/VI murmur LLSB and sortic area, radiating to carotids. S2 preserved) heard. Rt femoral cath site with ecchymosis without significant hematoma. No bleeding.  Respiratory: Effort normal and breath sounds normal. She has no wheezes. She has no rales.  GI: Soft. Bowel sounds are normal. She exhibits no distension.  Musculoskeletal: She exhibits no edema.  Neurological: She is alert and oriented to person, place, and time.  Skin: Skin is warm and dry.  Disposition: 01-Home or Self Care  Discharge Instructions    AMB Referral to Cardiac Rehabilitation - Phase II    Complete by:  As directed    Diagnosis:  PTCA   Call MD for:  difficulty breathing, headache or visual disturbances    Complete by:   As directed    Call MD for:  severe uncontrolled pain    Complete by:  As directed    Diet - low sodium heart healthy    Complete by:  As directed    Increase activity slowly    Complete by:  As directed      Allergies as of 09/22/2017      Reactions   Codeine Nausea And Vomiting      Medication List    TAKE these medications   amLODipine 10 MG tablet Commonly known as:  NORVASC Take 5 mg by mouth daily.   aspirin 81 MG chewable tablet Chew 1 tablet (81 mg total) by mouth daily.   atenolol 100 MG tablet Commonly known as:  TENORMIN Take 100 mg by mouth daily.   clopidogrel 75 MG tablet Commonly known as:  PLAVIX Take 1 tablet (75 mg total) by mouth daily with breakfast.   diphenhydrAMINE 25 MG tablet Commonly known as:  BENADRYL Take 25 mg by mouth daily as needed for allergies.   lisinopril 40 MG tablet Commonly known as:  PRINIVIL,ZESTRIL Take 40 mg by mouth daily.   NYQUIL PO Take 1 Dose by mouth at bedtime as needed (sleep).   potassium chloride SA 20 MEQ tablet Commonly known as:  K-DUR,KLOR-CON Take 2 tablets (40 mEq total) by mouth daily.   rosuvastatin 10 MG tablet Commonly known as:  CRESTOR Take 10 mg by mouth daily.      Follow-up Information    Elder NegusPatwardhan, Aron Inge J, MD Follow up on 09/30/2017.   Specialty:  Cardiology Why:  12:00 PM Contact information: [address] [place] [address] [phone]           Signed: Elder NegusManish J Ludwig Tugwell 09/22/2017, 8:31 AM

## 2017-09-28 DIAGNOSIS — I25118 Atherosclerotic heart disease of native coronary artery with other forms of angina pectoris: Secondary | ICD-10-CM | POA: Diagnosis not present

## 2017-09-29 DIAGNOSIS — M810 Age-related osteoporosis without current pathological fracture: Secondary | ICD-10-CM | POA: Diagnosis not present

## 2017-09-30 DIAGNOSIS — I10 Essential (primary) hypertension: Secondary | ICD-10-CM | POA: Diagnosis not present

## 2017-09-30 DIAGNOSIS — I2510 Atherosclerotic heart disease of native coronary artery without angina pectoris: Secondary | ICD-10-CM | POA: Diagnosis not present

## 2017-09-30 DIAGNOSIS — I350 Nonrheumatic aortic (valve) stenosis: Secondary | ICD-10-CM | POA: Diagnosis not present

## 2017-12-28 DIAGNOSIS — R0989 Other specified symptoms and signs involving the circulatory and respiratory systems: Secondary | ICD-10-CM | POA: Diagnosis not present

## 2018-01-06 DIAGNOSIS — I2510 Atherosclerotic heart disease of native coronary artery without angina pectoris: Secondary | ICD-10-CM | POA: Diagnosis not present

## 2018-01-06 DIAGNOSIS — I10 Essential (primary) hypertension: Secondary | ICD-10-CM | POA: Diagnosis not present

## 2018-01-06 DIAGNOSIS — I350 Nonrheumatic aortic (valve) stenosis: Secondary | ICD-10-CM | POA: Diagnosis not present

## 2018-01-24 DIAGNOSIS — I10 Essential (primary) hypertension: Secondary | ICD-10-CM | POA: Diagnosis not present

## 2018-01-24 DIAGNOSIS — E559 Vitamin D deficiency, unspecified: Secondary | ICD-10-CM | POA: Diagnosis not present

## 2018-01-24 DIAGNOSIS — E7800 Pure hypercholesterolemia, unspecified: Secondary | ICD-10-CM | POA: Diagnosis not present

## 2018-01-24 DIAGNOSIS — M810 Age-related osteoporosis without current pathological fracture: Secondary | ICD-10-CM | POA: Diagnosis not present

## 2018-01-27 DIAGNOSIS — I491 Atrial premature depolarization: Secondary | ICD-10-CM | POA: Diagnosis not present

## 2018-01-27 DIAGNOSIS — R079 Chest pain, unspecified: Secondary | ICD-10-CM | POA: Diagnosis not present

## 2018-01-27 DIAGNOSIS — J309 Allergic rhinitis, unspecified: Secondary | ICD-10-CM | POA: Diagnosis not present

## 2018-01-27 DIAGNOSIS — I4891 Unspecified atrial fibrillation: Secondary | ICD-10-CM | POA: Diagnosis not present

## 2018-01-27 DIAGNOSIS — M545 Low back pain: Secondary | ICD-10-CM | POA: Diagnosis not present

## 2018-01-27 DIAGNOSIS — K5730 Diverticulosis of large intestine without perforation or abscess without bleeding: Secondary | ICD-10-CM | POA: Diagnosis not present

## 2018-01-27 DIAGNOSIS — M810 Age-related osteoporosis without current pathological fracture: Secondary | ICD-10-CM | POA: Diagnosis not present

## 2018-01-27 DIAGNOSIS — Z23 Encounter for immunization: Secondary | ICD-10-CM | POA: Diagnosis not present

## 2018-01-27 DIAGNOSIS — E7800 Pure hypercholesterolemia, unspecified: Secondary | ICD-10-CM | POA: Diagnosis not present

## 2018-01-27 DIAGNOSIS — I499 Cardiac arrhythmia, unspecified: Secondary | ICD-10-CM | POA: Diagnosis not present

## 2018-01-27 DIAGNOSIS — I10 Essential (primary) hypertension: Secondary | ICD-10-CM | POA: Diagnosis not present

## 2018-01-27 DIAGNOSIS — E559 Vitamin D deficiency, unspecified: Secondary | ICD-10-CM | POA: Diagnosis not present

## 2018-01-28 DIAGNOSIS — I10 Essential (primary) hypertension: Secondary | ICD-10-CM | POA: Diagnosis not present

## 2018-01-28 DIAGNOSIS — I491 Atrial premature depolarization: Secondary | ICD-10-CM | POA: Diagnosis not present

## 2018-01-28 DIAGNOSIS — R079 Chest pain, unspecified: Secondary | ICD-10-CM | POA: Diagnosis not present

## 2018-01-28 DIAGNOSIS — I739 Peripheral vascular disease, unspecified: Secondary | ICD-10-CM | POA: Diagnosis not present

## 2018-02-02 DIAGNOSIS — M810 Age-related osteoporosis without current pathological fracture: Secondary | ICD-10-CM | POA: Diagnosis not present

## 2018-02-02 DIAGNOSIS — I10 Essential (primary) hypertension: Secondary | ICD-10-CM | POA: Diagnosis not present

## 2018-02-02 DIAGNOSIS — I2510 Atherosclerotic heart disease of native coronary artery without angina pectoris: Secondary | ICD-10-CM | POA: Diagnosis not present

## 2018-02-26 DIAGNOSIS — R079 Chest pain, unspecified: Secondary | ICD-10-CM | POA: Diagnosis not present

## 2018-03-03 DIAGNOSIS — I739 Peripheral vascular disease, unspecified: Secondary | ICD-10-CM | POA: Diagnosis not present

## 2018-03-03 DIAGNOSIS — I491 Atrial premature depolarization: Secondary | ICD-10-CM | POA: Diagnosis not present

## 2018-03-03 DIAGNOSIS — I480 Paroxysmal atrial fibrillation: Secondary | ICD-10-CM | POA: Diagnosis not present

## 2018-03-03 DIAGNOSIS — I2510 Atherosclerotic heart disease of native coronary artery without angina pectoris: Secondary | ICD-10-CM | POA: Diagnosis not present

## 2018-03-31 DIAGNOSIS — M810 Age-related osteoporosis without current pathological fracture: Secondary | ICD-10-CM | POA: Diagnosis not present

## 2018-07-27 DIAGNOSIS — I350 Nonrheumatic aortic (valve) stenosis: Secondary | ICD-10-CM | POA: Diagnosis not present

## 2018-08-03 DIAGNOSIS — I2510 Atherosclerotic heart disease of native coronary artery without angina pectoris: Secondary | ICD-10-CM | POA: Diagnosis not present

## 2018-08-03 DIAGNOSIS — I739 Peripheral vascular disease, unspecified: Secondary | ICD-10-CM | POA: Diagnosis not present

## 2018-08-03 DIAGNOSIS — I480 Paroxysmal atrial fibrillation: Secondary | ICD-10-CM | POA: Diagnosis not present

## 2018-08-03 DIAGNOSIS — I10 Essential (primary) hypertension: Secondary | ICD-10-CM | POA: Diagnosis not present

## 2018-08-15 DIAGNOSIS — I480 Paroxysmal atrial fibrillation: Secondary | ICD-10-CM | POA: Diagnosis not present

## 2018-10-05 DIAGNOSIS — M810 Age-related osteoporosis without current pathological fracture: Secondary | ICD-10-CM | POA: Diagnosis not present

## 2018-11-02 DIAGNOSIS — I739 Peripheral vascular disease, unspecified: Secondary | ICD-10-CM | POA: Diagnosis not present

## 2018-11-02 DIAGNOSIS — I2510 Atherosclerotic heart disease of native coronary artery without angina pectoris: Secondary | ICD-10-CM | POA: Diagnosis not present

## 2018-11-02 DIAGNOSIS — I480 Paroxysmal atrial fibrillation: Secondary | ICD-10-CM | POA: Diagnosis not present

## 2018-11-02 DIAGNOSIS — I10 Essential (primary) hypertension: Secondary | ICD-10-CM | POA: Diagnosis not present

## 2018-12-23 ENCOUNTER — Telehealth (INDEPENDENT_AMBULATORY_CARE_PROVIDER_SITE_OTHER): Payer: Self-pay | Admitting: *Deleted

## 2018-12-23 NOTE — Telephone Encounter (Signed)
Patient is on recall for 5 yr TCS -- please advise if ok to proceed due to patient's age

## 2019-01-10 NOTE — Telephone Encounter (Signed)
If patient interested in colonoscopy I would recommend office visit first. She is not having any symptoms she may decide not to have any more colonoscopies.

## 2019-01-30 DIAGNOSIS — I10 Essential (primary) hypertension: Secondary | ICD-10-CM | POA: Diagnosis not present

## 2019-01-30 DIAGNOSIS — E7800 Pure hypercholesterolemia, unspecified: Secondary | ICD-10-CM | POA: Diagnosis not present

## 2019-01-30 DIAGNOSIS — M810 Age-related osteoporosis without current pathological fracture: Secondary | ICD-10-CM | POA: Diagnosis not present

## 2019-02-02 DIAGNOSIS — I351 Nonrheumatic aortic (valve) insufficiency: Secondary | ICD-10-CM | POA: Diagnosis not present

## 2019-02-02 DIAGNOSIS — M810 Age-related osteoporosis without current pathological fracture: Secondary | ICD-10-CM | POA: Diagnosis not present

## 2019-02-02 DIAGNOSIS — J309 Allergic rhinitis, unspecified: Secondary | ICD-10-CM | POA: Diagnosis not present

## 2019-02-02 DIAGNOSIS — Z23 Encounter for immunization: Secondary | ICD-10-CM | POA: Diagnosis not present

## 2019-02-02 DIAGNOSIS — I350 Nonrheumatic aortic (valve) stenosis: Secondary | ICD-10-CM | POA: Diagnosis not present

## 2019-02-02 DIAGNOSIS — I2510 Atherosclerotic heart disease of native coronary artery without angina pectoris: Secondary | ICD-10-CM | POA: Diagnosis not present

## 2019-02-02 DIAGNOSIS — I10 Essential (primary) hypertension: Secondary | ICD-10-CM | POA: Diagnosis not present

## 2019-02-02 DIAGNOSIS — N183 Chronic kidney disease, stage 3 (moderate): Secondary | ICD-10-CM | POA: Diagnosis not present

## 2019-02-02 DIAGNOSIS — I272 Pulmonary hypertension, unspecified: Secondary | ICD-10-CM | POA: Diagnosis not present

## 2019-02-06 ENCOUNTER — Other Ambulatory Visit: Payer: Self-pay | Admitting: Cardiology

## 2019-02-06 DIAGNOSIS — I2510 Atherosclerotic heart disease of native coronary artery without angina pectoris: Secondary | ICD-10-CM

## 2019-03-24 ENCOUNTER — Other Ambulatory Visit: Payer: Self-pay | Admitting: Cardiology

## 2019-03-24 NOTE — Telephone Encounter (Signed)
Ok to fill 

## 2019-04-11 DIAGNOSIS — Z23 Encounter for immunization: Secondary | ICD-10-CM | POA: Diagnosis not present

## 2019-04-11 DIAGNOSIS — I10 Essential (primary) hypertension: Secondary | ICD-10-CM | POA: Diagnosis not present

## 2019-04-11 DIAGNOSIS — M810 Age-related osteoporosis without current pathological fracture: Secondary | ICD-10-CM | POA: Diagnosis not present

## 2019-04-11 DIAGNOSIS — I2510 Atherosclerotic heart disease of native coronary artery without angina pectoris: Secondary | ICD-10-CM | POA: Diagnosis not present

## 2019-04-11 DIAGNOSIS — I4891 Unspecified atrial fibrillation: Secondary | ICD-10-CM | POA: Diagnosis not present

## 2019-05-01 ENCOUNTER — Ambulatory Visit (INDEPENDENT_AMBULATORY_CARE_PROVIDER_SITE_OTHER): Payer: Medicare Other

## 2019-05-01 ENCOUNTER — Ambulatory Visit (INDEPENDENT_AMBULATORY_CARE_PROVIDER_SITE_OTHER): Payer: Medicare Other | Admitting: Cardiology

## 2019-05-01 ENCOUNTER — Other Ambulatory Visit: Payer: Self-pay

## 2019-05-01 ENCOUNTER — Other Ambulatory Visit: Payer: PRIVATE HEALTH INSURANCE

## 2019-05-01 ENCOUNTER — Encounter: Payer: Self-pay | Admitting: Cardiology

## 2019-05-01 VITALS — BP 121/61 | HR 65 | Ht 64.0 in | Wt 110.0 lb

## 2019-05-01 DIAGNOSIS — I2510 Atherosclerotic heart disease of native coronary artery without angina pectoris: Secondary | ICD-10-CM | POA: Insufficient documentation

## 2019-05-01 DIAGNOSIS — I350 Nonrheumatic aortic (valve) stenosis: Secondary | ICD-10-CM | POA: Diagnosis not present

## 2019-05-01 DIAGNOSIS — R0609 Other forms of dyspnea: Secondary | ICD-10-CM | POA: Diagnosis not present

## 2019-05-01 DIAGNOSIS — I351 Nonrheumatic aortic (valve) insufficiency: Secondary | ICD-10-CM

## 2019-05-01 NOTE — Progress Notes (Signed)
Follow up visit  Subjective:   Catherine Anthony, female    DOB: Jul 22, 1930, 85 y.o.   MRN: [id number]   Chief Complaint  Patient presents with  . Coronary Artery Disease  . Aortic Stenosis  . Follow-up    after ECHO     HPI  85 year old Caucasian female with hypertension, moderate aortic stenosis with moderate aortic regurgitation and mitral calcification, CAD s/p successful diamondback orbital atherectomy and multivessel PCI to LAD, left circumflex, proximal to mid RCA (08/2017), paroxysmal Afib, here for follow up.  Patient is here with her daughter. Over a period of time, she has developed shortness of breath on exertion, denies chest pain. She is still able perform her ADL's, and able to garden from time to time, but has certainly cut back her activity over a period of time. She had one episode of near syncope in 11/2018 that she attributed to being dehydrated. This episode has not occurred since.   Echocardiogram reviewed with the patient and daughter, details below.   Past Medical History:  Diagnosis Date  . Complication of anesthesia   . Coronary artery disease   . Family hx of colon cancer   . Heart murmur    "i've always had one"  . High cholesterol   . Hypertension   . Migraine    "none in years" (09/21/2017)  . PONV (postoperative nausea and vomiting)      Past Surgical History:  Procedure Laterality Date  . CATARACT EXTRACTION W/ INTRAOCULAR LENS IMPLANT Right 06/04/2011   [facility]  . CATARACT EXTRACTION W/PHACO  06/22/2011   Procedure: CATARACT EXTRACTION PHACO AND INTRAOCULAR LENS PLACEMENT (IOC);  Surgeon: Tonny Branch;  Location: [facility];  Service: Ophthalmology;  Laterality: Left;  CDE 14.34  . COLONOSCOPY  06/23/08  . COLONOSCOPY N/A 01/18/2014   Procedure: COLONOSCOPY;  Surgeon: Rogene Houston, MD;  Location: [facility];  Service: Endoscopy;  Laterality: N/A;  145  . CORONARY ANGIOPLASTY WITH STENT PLACEMENT    . CORONARY ATHERECTOMY N/A 09/21/2017   Procedure: CORONARY ATHERECTOMY;  Surgeon: Nigel Mormon, MD;  Location: [place] [facility];  Service: Cardiovascular;  Laterality: N/A;  . CORONARY STENT INTERVENTION N/A 08/31/2017   Procedure: CORONARY STENT INTERVENTION;  Surgeon: Nigel Mormon, MD;  Location: [place] [facility];  Service: Cardiovascular;  Laterality: N/A;  . CORONARY STENT INTERVENTION N/A 09/21/2017   Procedure: CORONARY STENT INTERVENTION;  Surgeon: Nigel Mormon, MD;  Location: [place] [facility];  Service: Cardiovascular;  Laterality: N/A;  . EYE SURGERY Bilateral    "after cataract OR"  . INTRAVASCULAR ULTRASOUND/IVUS N/A 09/21/2017   Procedure: Intravascular Ultrasound/IVUS;  Surgeon: Nigel Mormon, MD;  Location: [place] [facility];  Service: Cardiovascular;  Laterality: N/A;  . RIGHT/LEFT HEART CATH AND CORONARY ANGIOGRAPHY N/A 08/31/2017   Procedure: RIGHT/LEFT HEART CATH AND CORONARY ANGIOGRAPHY;  Surgeon: Nigel Mormon, MD;  Location: [place] [facility];  Service: Cardiovascular;  Laterality: N/A;  . ULTRASOUND GUIDANCE FOR VASCULAR ACCESS  09/21/2017   Procedure: Ultrasound Guidance For Vascular Access;  Surgeon: Nigel Mormon, MD;  Location: [place] [facility];  Service: Cardiovascular;;  . VARICOSE VEIN SURGERY Bilateral 1960s   [facility]     Social History   Socioeconomic History  . Marital status: Widowed    Spouse name: Not on file  . Number of children: Not on file  . Years of education: Not on file  . Highest education level: Not on file  Occupational History  . Not on file  Social Needs  . Financial resource strain: Not on file  . Food insecurity:    Worry: Not on file    Inability: Not on file  . Transportation needs:    Medical: Not on file    Non-medical: Not on file  Tobacco Use  . Smoking status: Never Smoker  . Smokeless tobacco: Never Used  Substance and Sexual Activity  . Alcohol use: No  . Drug use: No  . Sexual activity: Not on file   Lifestyle  . Physical activity:    Days per week: Not on file    Minutes per session: Not on file  . Stress: Not on file  Relationships  . Social connections:    Talks on phone: Not on file    Gets together: Not on file    Attends religious service: Not on file    Active member of club or organization: Not on file    Attends meetings of clubs or organizations: Not on file    Relationship status: Not on file  . Intimate partner violence:    Fear of current or ex partner: Not on file    Emotionally abused: Not on file    Physically abused: Not on file    Forced sexual activity: Not on file  Other Topics Concern  . Not on file  Social History Narrative  . Not on file     History reviewed. No pertinent family history.   Current Outpatient Medications on File Prior to Visit  Medication Sig Dispense Refill  . amLODipine (NORVASC) 10 MG tablet Take 5 mg by mouth daily.     Marland Kitchen aspirin 81 MG chewable tablet Chew 1 tablet (81 mg total) by mouth daily. 60 tablet 6  . atenolol (TENORMIN) 100 MG tablet Take 100 mg by mouth daily.     . diphenhydrAMINE (BENADRYL) 25 MG tablet Take 25 mg by mouth daily as needed for allergies.    Marland Kitchen ELIQUIS 2.5 MG TABS tablet TAKE (1) TABLET BY MOUTH TWICE DAILY. 180 tablet 3  . lisinopril (PRINIVIL,ZESTRIL) 40 MG tablet Take 40 mg by mouth daily.     . potassium chloride SA (K-DUR,KLOR-CON) 20 MEQ tablet Take 2 tablets (40 mEq total) by mouth daily. 30 tablet 3  . Pseudoeph-Doxylamine-DM-APAP (NYQUIL PO) Take 1 Dose by mouth at bedtime as needed (sleep).    . rosuvastatin (CRESTOR) 10 MG tablet Take 10 mg by mouth daily.      No current facility-administered medications on file prior to visit.     Cardiovascular studies:  Echocardiogram 05/01/2019: Normal LV systolic function with EF 66%. Left ventricle cavity is normal in size. Normal global wall motion. Diastolic function assessment limited due to degree of mitral annular calcification (MAC). Left  atrial cavity is mildly dilated. Right atrial cavity is moderately dilated. Trileaflet aortic valve with severe aortic valve leaflet calcification. Aortic valve mean gradient of 28 mmHg, Vmax of 3.4 m/s. Calculated aortic valve area by continuity equation is 0.7 cm. Dimensionless index 0.20 suggests paradoxically low gradient severe aortic stenosis. Moderate aortic regurgitation noted.  Moderate mitral valve leaflet calcification. Mild mitral regurgitation. Moderate tricuspid regurgitation. Estimated pulmonary artery systolic pressure 24 mmHg. Compared to previous study on 07/17/2018, there is progression of aortic stenosis.  Echocardiogram 07/27/2018: Left ventricle cavity is normal in size. Mild concentric hypertrophy of the left ventricle. Normal global wall motion. Diastolic function assessment limited due to degree of mitral annular calcification (MAC) Calculated  EF 56%. Moderate calcification of the aortic valve annulus and leaflets. Aortic valve mean gradient of 20 mmHg, Vmax of 2.9  m/s. Calculated aortic valve area by continuity equation is 0.9 cm, AVA 0.6 cm2/m2. While AVA meets criteria for severe aortic stenosis, mean PG, peak velocity and dimensionless index of 0.27 suggest moderate aortic stenosis. Moderate (Grade III) aortic regurgitation. Moderate calcification of the mitral valve annulus. Mild mitral valve leaflet calcification. Mildly restricted mitral valve leaflets without significant stenosis.  Mild to moderate mitral regurgitation. Moderate tricuspid regurgitation. Estimated pulmonary artery systolic pressure 30  mmHg. No significant change compared to previous study on 08/05/2017.  Carotid artery duplex 12/28/2017: Minimal stenosis in the right internal carotid artery (1-15%). Left vertebral artery waveform is biphasic and suggests proximal left subclavian artery stenosis.  Antegrade right vertebral artery flow. Further studies if clinically indicated.  Coronary  atherectomy and angioplasty 09/21/2017: Successful diamondback orbital atherectomy and two-vessel PCI PTCA and overlapping drug-eluting stents placement LCx Synergy DES 2.5 x 12 mm Synergy DES 2.5 x 20 mm Synergy DES 3.0 X 20 mm PTCA and overlapping drug-eluting stents placement LAD Synergy DES 3.0 x 24 mm Synergy DES 3.5 X 8 mm  Coronary angioram, LHC/RHC, RCA PCI 09/01/2017: Moderate calcific aortic and mitral valve stenosis Severe multivessel CAD Staged PCI to mid-prox RCA Successful PTCA and stent placement Resolute Onyx 3.5 X 26 mm DES  Moderate calcific aortic and mitral valve stenosis Severe multivessel CAD Staged PCI to mid-prox RCA Successful PTCA and stent placement Resolute Onyx 3.5 X 26 mm DES  Recent labs: 08/15/2018: Glucose 87. BUN/Cr 18/1.05. eGFR 48. Na/K 139/4.5  01/24/2018: Cholesterol 143, triglycerides 71, HDL 53, LDL 76. Glucose 84.  BUN/creatinine 22/0.9.  EGFR 67, sodium 137, potassium 4.3 H/H 13.2/39.9.  MCV 95.  Platelets 264   Review of Systems  Constitution: Negative for decreased appetite, malaise/fatigue, weight gain and weight loss.  HENT: Negative for congestion.   Eyes: Negative for visual disturbance.  Cardiovascular: Positive for dyspnea on exertion. Negative for chest pain, leg swelling, palpitations and syncope.  Respiratory: Positive for shortness of breath. Negative for cough.   Endocrine: Negative for cold intolerance.  Hematologic/Lymphatic: Does not bruise/bleed easily.  Skin: Negative for itching and rash.  Musculoskeletal: Negative for myalgias.  Gastrointestinal: Negative for abdominal pain, nausea and vomiting.  Genitourinary: Negative for dysuria.  Neurological: Negative for dizziness and weakness.  Psychiatric/Behavioral: The patient is not nervous/anxious.   All other systems reviewed and are negative.        Vitals:   05/01/19 1244  BP: 121/61  Pulse: 65  SpO2: 97%    Body mass index is 18.88 kg/m. Filed  Weights   05/01/19 1244  Weight: 110 lb (49.9 kg)     Objective:   Physical Exam  Constitutional: She is oriented to person, place, and time. She appears well-developed and well-nourished. No distress.  HENT:  Head: Normocephalic and atraumatic.  Eyes: Pupils are equal, round, and reactive to light. Conjunctivae are normal.  Neck: No JVD present.  Cardiovascular: Normal rate and regular rhythm.  Murmur heard.  Harsh midsystolic murmur is present with a grade of 4/6 at the upper right sternal border radiating to the neck. Very soft S2. Pulses:      Carotid pulses are on the right side with bruit and on the left side with bruit.      Femoral pulses are 3+ on the right side and 3+ on the left side.      Popliteal  pulses are 2+ on the right side and 1+ on the left side.       Dorsalis pedis pulses are 0 on the right side and 0 on the left side.       Posterior tibial pulses are 0 on the left side.  Pulmonary/Chest: Effort normal and breath sounds normal. She has no wheezes. She has no rales.  Abdominal: Soft. Bowel sounds are normal. There is no rebound.  Musculoskeletal:        General: No edema.  Lymphadenopathy:    She has no cervical adenopathy.  Neurological: She is alert and oriented to person, place, and time. No cranial nerve deficit.  Skin: Skin is warm and dry.  Psychiatric: She has a normal mood and affect.  Nursing note and vitals reviewed.         Assessment & Recommendations:   85 year old Caucasian female with hypertension, moderate aortic stenosis with moderate aortic regurgitation and mitral calcification, CAD s/p successful diamondback orbital atherectomy and multivessel PCI to LAD, left circumflex, proximal to mid RCA (08/2017), paroxysmal Afib, here for follow up.  1. Nonrheumatic aortic valve stenosis Progression of aortic stenosis, what now appears to be severe aortic stenosis, likely paradoxically low gradient severe AS. She also has moderate aortic  regurgitation. Given her symptoms of exertional dyspnea and near syncope, I recommend evaluation for TAVR. If felt to be a candidate for TAVR, I will then proceed with TEE and diagnostic RHC/LHC. Will check CBC, BMP, BNP.  2. Exertional dyspnea Likely due to the above. Stable. Continue current medical therapy.   3. Coronary artery disease involving native coronary artery of native heart without angina pectoris No angina symptoms. It appeared that she had been off crestor. Refilled at 10 mg daily. Will check lipid panel.     Nigel Mormon, MD [facility] [place] Pager: [phone] Office: [phone] If no answer Cell [phone]

## 2019-05-02 DIAGNOSIS — I2510 Atherosclerotic heart disease of native coronary artery without angina pectoris: Secondary | ICD-10-CM | POA: Diagnosis not present

## 2019-05-02 DIAGNOSIS — I350 Nonrheumatic aortic (valve) stenosis: Secondary | ICD-10-CM | POA: Diagnosis not present

## 2019-05-02 DIAGNOSIS — R0609 Other forms of dyspnea: Secondary | ICD-10-CM | POA: Diagnosis not present

## 2019-05-03 LAB — BASIC METABOLIC PANEL
BUN/Creatinine Ratio: 20 (ref 12–28)
BUN: 24 mg/dL (ref 8–27)
CO2: 21 mmol/L (ref 20–29)
Calcium: 10.1 mg/dL (ref 8.7–10.3)
Chloride: 102 mmol/L (ref 96–106)
Creatinine, Ser: 1.21 mg/dL — ABNORMAL HIGH (ref 0.57–1.00)
GFR calc Af Amer: 47 mL/min/{1.73_m2} — ABNORMAL LOW (ref >59)
GFR calc non Af Amer: 41 mL/min/{1.73_m2} — ABNORMAL LOW (ref >59)
Glucose: 89 mg/dL (ref 65–99)
Potassium: 5 mmol/L (ref 3.5–5.2)
Sodium: 138 mmol/L (ref 134–144)

## 2019-05-03 LAB — CBC
Hematocrit: 38.2 % (ref 34.0–46.6)
Hemoglobin: 13.6 g/dL (ref 11.1–15.9)
MCH: 33.9 pg — ABNORMAL HIGH (ref 26.6–33.0)
MCHC: 35.6 g/dL (ref 31.5–35.7)
MCV: 95 fL (ref 79–97)
Platelets: 284 10*3/uL (ref 150–450)
RBC: 4.01 x10E6/uL (ref 3.77–5.28)
RDW: 12.1 % (ref 11.7–15.4)
WBC: 5.8 10*3/uL (ref 3.4–10.8)

## 2019-05-03 LAB — LIPID PANEL
Chol/HDL Ratio: 4.7 ratio — ABNORMAL HIGH (ref 0.0–4.4)
Cholesterol, Total: 204 mg/dL — ABNORMAL HIGH (ref 100–199)
HDL: 43 mg/dL (ref >39)
LDL Calculated: 135 mg/dL — ABNORMAL HIGH (ref 0–99)
Triglycerides: 129 mg/dL (ref 0–149)
VLDL Cholesterol Cal: 26 mg/dL (ref 5–40)

## 2019-05-03 LAB — BRAIN NATRIURETIC PEPTIDE: BNP: 189 pg/mL — ABNORMAL HIGH (ref 0.0–100.0)

## 2019-05-08 ENCOUNTER — Ambulatory Visit: Payer: PRIVATE HEALTH INSURANCE | Admitting: Cardiology

## 2019-05-08 ENCOUNTER — Encounter: Payer: Self-pay | Admitting: Cardiovascular Disease

## 2019-05-08 ENCOUNTER — Ambulatory Visit (INDEPENDENT_AMBULATORY_CARE_PROVIDER_SITE_OTHER): Payer: Medicare Other | Admitting: Cardiovascular Disease

## 2019-05-08 ENCOUNTER — Other Ambulatory Visit: Payer: Self-pay

## 2019-05-08 VITALS — BP 122/62 | HR 70 | Ht 64.0 in | Wt 112.0 lb

## 2019-05-08 DIAGNOSIS — I350 Nonrheumatic aortic (valve) stenosis: Secondary | ICD-10-CM

## 2019-05-08 DIAGNOSIS — I2510 Atherosclerotic heart disease of native coronary artery without angina pectoris: Secondary | ICD-10-CM | POA: Diagnosis not present

## 2019-05-08 NOTE — Progress Notes (Signed)
Cardiology Office Note:    Date:  05/11/2019   ID:  ANASOPHIA PECOR, DOB Sep 14, 1930, MRN [id number]  PCP:  Deland Pretty, MD  Cardiologist:  No primary care provider on file.  Electrophysiologist:  None   Referring MD: Deland Pretty, MD   Chief Complaint  Patient presents with   Shortness of Breath   History of Present Illness:    Jannifer TRYSTAN EADS is a 85 y.o. female with a hx of coronary disease and aortic stenosis, referred for consideration of TAVR by Dr Virgina Jock.   The patient is here with her daughter today. She's had a longstanding heart murmur, dating back over 60 years. More recently she has followed with Dr Virgina Jock with serial echo studies demonstrating moderate aortic stenosis. However, her recent echo has demonstrated progressive AS, now felt to have paradoxical severe low flow low gradient AS.  She initially presented in 2018 with shortness of breath, exercise intolerance, and fatigue. She was found to have severe multivessel CAD and underwent multivessel PCI in staged fashion, treated with 6 DES (1 in RCA, 2 in LAD, and 3 in LCx.   The patient lives with her daughter. She is widowed x 34 years. She had 5 children, but had a son who died of brain cancer. Over several months she has noticed that she has to stop and rest more frequently with physical activity. She complains of shortness of breath with activity. This occurs with low level activity such as walking in a store or doing light vacuuming. She has not had any chest discomfort recently and hasn't taken any NTG in this calendar year. She hasn't had recent lightheadedness or syncope, but did have a near-syncopal event in January 2020 when getting up to go into the kitchen.    Past Medical History:  Diagnosis Date   Complication of anesthesia    Coronary artery disease    Family hx of colon cancer    Heart murmur    "i've always had one"   High cholesterol    Hypertension    Migraine    "none in years"  (09/21/2017)   PONV (postoperative nausea and vomiting)     Past Surgical History:  Procedure Laterality Date   CATARACT EXTRACTION W/ INTRAOCULAR LENS IMPLANT Right 06/04/2011   [facility]   CATARACT EXTRACTION W/PHACO  06/22/2011   Procedure: CATARACT EXTRACTION PHACO AND INTRAOCULAR LENS PLACEMENT ([place]);  Surgeon: Tonny Branch;  Location: [facility];  Service: Ophthalmology;  Laterality: Left;  CDE 14.34   COLONOSCOPY  06/23/08   COLONOSCOPY N/A 01/18/2014   Procedure: COLONOSCOPY;  Surgeon: Rogene Houston, MD;  Location: [facility];  Service: Endoscopy;  Laterality: N/A;  145   CORONARY ANGIOPLASTY WITH STENT PLACEMENT     CORONARY ATHERECTOMY N/A 09/21/2017   Procedure: CORONARY ATHERECTOMY;  Surgeon: Nigel Mormon, MD;  Location: [place] [facility];  Service: Cardiovascular;  Laterality: N/A;   CORONARY STENT INTERVENTION N/A 08/31/2017   Procedure: CORONARY STENT INTERVENTION;  Surgeon: Nigel Mormon, MD;  Location: [place] [facility];  Service: Cardiovascular;  Laterality: N/A;   CORONARY STENT INTERVENTION N/A 09/21/2017   Procedure: CORONARY STENT INTERVENTION;  Surgeon: Nigel Mormon, MD;  Location: [place] [facility];  Service: Cardiovascular;  Laterality: N/A;   EYE SURGERY Bilateral    "after cataract OR"   INTRAVASCULAR ULTRASOUND/IVUS N/A 09/21/2017   Procedure: Intravascular Ultrasound/IVUS;  Surgeon: Nigel Mormon, MD;  Location: [place] [facility];  Service: Cardiovascular;  Laterality:  N/A;   RIGHT/LEFT HEART CATH AND CORONARY ANGIOGRAPHY N/A 08/31/2017   Procedure: RIGHT/LEFT HEART CATH AND CORONARY ANGIOGRAPHY;  Surgeon: Nigel Mormon, MD;  Location: [place] [facility];  Service: Cardiovascular;  Laterality: N/A;   ULTRASOUND GUIDANCE FOR VASCULAR ACCESS  09/21/2017   Procedure: Ultrasound Guidance For Vascular Access;  Surgeon: Nigel Mormon, MD;  Location: [place] [facility];  Service: Cardiovascular;;   VARICOSE VEIN  SURGERY Bilateral 1960s   [facility]    Current Medications: Current Meds  Medication Sig   amLODipine (NORVASC) 10 MG tablet Take 10 mg by mouth daily.    atenolol (TENORMIN) 100 MG tablet Take 100 mg by mouth daily.    diphenhydrAMINE (BENADRYL) 25 MG tablet Take 25 mg by mouth daily as needed for allergies.   ELIQUIS 2.5 MG TABS tablet TAKE (1) TABLET BY MOUTH TWICE DAILY. (Patient taking differently: Take 2.5 mg by mouth 2 (two) times daily. )   lisinopril (PRINIVIL,ZESTRIL) 40 MG tablet Take 40 mg by mouth daily.    spironolactone (ALDACTONE) 25 MG tablet Take 25 mg by mouth daily.    [DISCONTINUED] Pseudoeph-Doxylamine-DM-APAP (NYQUIL PO) Take 1 Dose by mouth at bedtime as needed (sleep).     Allergies:   Codeine   Social History   Socioeconomic History   Marital status: Widowed    Spouse name: Not on file   Number of children: 5   Years of education: Not on file   Highest education level: Not on file  Occupational History   Not on file  Social Needs   Financial resource strain: Not on file   Food insecurity    Worry: Not on file    Inability: Not on file   Transportation needs    Medical: Not on file    Non-medical: Not on file  Tobacco Use   Smoking status: Never Smoker   Smokeless tobacco: Never Used  Substance and Sexual Activity   Alcohol use: No   Drug use: No   Sexual activity: Not on file  Lifestyle   Physical activity    Days per week: Not on file    Minutes per session: Not on file   Stress: Not on file  Relationships   Social connections    Talks on phone: Not on file    Gets together: Not on file    Attends religious service: Not on file    Active member of club or organization: Not on file    Attends meetings of clubs or organizations: Not on file    Relationship status: Not on file  Other Topics Concern   Not on file  Social History Narrative   Not on file     Family History: The patient's family history is  negative for valvular heart disease. Her father had a massive MI in his 45's, died at age 72. Mother died of an MI at age 6.   ROS:   Please see the history of present illness.     All other systems reviewed and are negative.  EKGs/Labs/Other Studies Reviewed:    The following studies were reviewed today: Cardiac Cath 08-31-2017: Conclusion   Moderate calcific aortic and mitral valve stenosis Severe multivessel CAD Staged PCI to mid-prox RCA Successful PTCA and stent placement Resolute Onyx 3.5 X 26 mm DES  Recommend ASA 81 mg and clopidogrel 75 mg daily. PCI to LAD and LCx will be performed on another visit.  Nigel Mormon, MD   Procedural Details  Technical Details Procedures: 1. Ultrasound guided radial artery access 2. Right heart catheterization 3. Left heart catheterization with simultaneous LV/AO measurement 4. Selective left and right coronary angiography 5. Conscious sedation monitoring 113 min  Indication: Exertional dyspnea Nonrheumatic aortic stenosis  History: The patient is a 85 year old female, accompanied by her daughter [name] and [name]) during the visit, who presents for a follow-up for Shortness of breath. 85 year old female seen by me on 07/26/2017 with complaints of shortness of breath.  Her only medical history is hypertension which is well-controlled on 3 agents.  On exam I had heard 3/6 systolic murmur with radiation to carotids.  Suspecting aortic stenosis, I'll recommended an echocardiogram.  Echocardiogram showed normal EF, grade 2 diastolic dysfunction.  There was paradoxical atrial flutter low gradient aortic stenosis in the setting of low stroke volume.  However it was at best moderate in severity based on the gradient and max velocity of 18 mmHg and 2.4 m/s.  But aortic valve area measured at 0.9 cm.  There was moderate aortic regurgitation.  There was also moderate mitral annular calcification with mild mitral regurgitation. Hemodynamic  data is incongruent with severe aortic stenosis but her symptoms are concerning. This may be a comminution of mixed valvular diseaseand possibly coronary artery disease. Noninvasive stress test may provide information about ischemia but will still need a left and right heart catheterization to evaluate the valve given the discrepancies on echocardiogram. Invasive hemodynamic evaluation would offer information about her filling pressures, valves, as well as her coronary arteries.  If she has any moderate coronary artery stenosis, we can perform FFR evaluation to assist the hemodynamic significance.  Patient and her family would like to pursue.  Minimally invasive percutaneous treatment options along with medications as opposed to invasive surgery given her age.  If I find significant coronary artery disease during the heart catheterization, I will proceed with coronary revascularization if feasible.  Based on the invasive hemodynamic data, I will then refer her to Dr. Burt Knack or Dr. Angelena Form as necessary for TAVR evaluation.  I explained to the patient and daughters, that if she has severe aortic stenosis stage D3, her outcomes with potential TAVR would still be better than medical therapy may not be as good as that in patients with severe aortic stenosis stage D1.  Medical therapy alone remains an option but patient and daughter would like to proceed with further evaluation to guide management.      Catheter/s advanced over guidewire under fluoroscopy 4 Fr balloon tipped wedge catheter 5 Fr JL 3.5 5 Fr JR 4 6 Fr Langston double lumen  Wires used:  Inquire 0.025 glidewire   Pressures tracings obtained in right atrium, right ventricle, pulmonary artery, and pulmonary capillary wedge position.   Anticoagulation:  12,000 units heparin Antiplatelet agents: ASA 81 mg + clopidogrel 600 mg  Total contrast used: 155 cc   Total fluoro time: 19.2 min Air Kerma: 179 mGy  All wires and catheters  removed out of the body at the end of the procedure Final angiogram showed no dissection/perforation  Complications: None         Coronary Findings   Diagnostic  Dominance: Right  Left Anterior Descending  Prox LAD to Mid LAD lesion 85% stenosed  The lesion is calcified.  Left Circumflex  Prox Cx to Mid Cx lesion 85% stenosed  The lesion is calcified.  Right Coronary Artery  Prox RCA lesion 80% stenosed  Prox RCA lesion.  Intervention   Prox RCA lesion  Angioplasty  Lesion length: 20 mm. Lesion crossed with guidewire using a WIRE COUGAR XT STRL 190CM. Pre-stent angioplasty was performed using a BALLOON SAPPHIRE 2.5X20. Maximum pressure: 10 atm. Inflation time: 40 sec. A STENT RESOLUTE ONYX 3.5X26 drug eluting stent was successfully placed. Stent strut is well apposed. Post-stent angioplasty was performed using a BALLOON [place] EUPHORA SH7.0Y63. Maximum pressure: 16 atm. Inflation time: 40 sec. The pre-interventional distal flow is normal (TIMI 3). The post-interventional distal flow is normal (TIMI 3). The intervention was successful . No complications occurred at this lesion. Simultaneous LV-AO measurement was performed using Langston catheter. Mean PG was only 20 mmhg, even though AVA was 0.75 cm2. LVEF is normal 55-60% and cardiac output is also normal for her body size.This makes severe stage D2 AS less likely. Symptoms are likely due to multivessel CAD. Syntax score is 11. Patient and family had indicated prior to this procedure that they would prefer percutaneous treatment options over CABG. Her Syntax II score indicates CABG or PCI as equivalent options. Thus, we proceed with plan for staged PCI. LAD and LCx may need atherectomy and PCI which will be performed on another visit in a staged fashion.  There is a 0% residual stenosis post intervention.  Coronary Diagrams   Diagnostic  Dominance: Right    Intervention     Cardiac Cath/PCI 09-21-2017: Conclusion   Successful  diamondback orbital atherectomy and two vessel PCI PTCA and overlapping drug-eluting stents placement LCx     Synergy DES 2.5 X 12 mm     Synergy DES 2.5 X 20 mm     Synergy DES 3.0 X 20 mm PTCA and overlapping drug-eluting stents placement LAD     Synergy DES 3.0 X 24 mm     Synergy DES 3.5 X 8 mm  Recommendation: Continue ASA 81 mg lifelong and clopidogrel 75 mg at least for 6 months Admit overnight for monitoring. Sheath removal as per protocol  Nigel Mormon, MD   Procedural Details   Technical Details Procedures: 1. Ultrasound guided radial artery access 2. Selective left coronary angiography 3. Left heart catheterization 4. Diamondback orbital atherectomy LCx and LAD 5. PTCA and overlapping drug-eluting stents placement LCx     Synergy DES 2.5 X 12 mm     Synergy DES 2.5 X 20 mm     Synergy DES 3.0 X 20 mm 6. PTCA and overlapping drug-eluting stents placement LAD     Synergy DES 3.0 X 24 mm     Synergy DES 3.5 X 8 mm 7. Intravascular ultrasound 8. Conscious sedation monitoring 178 min  Indication: Severe calcific coronary artery disease with stable angina equivalvent  History: History of Present Illness Joya Gaskins Patwardhan MD; 09/08/2017 12:42 PM) Patient words: Last OV 08/12/2017; FU cath.  The patient is a 85 year old female who presents for a Follow-up for Shortness of breath. Pleasant 85 year old female with hypertension, moderate aortic stenosis with moderate aortic regurgitation and mitral calcification. Her echocardiogram had conflicting findings regarding rest aortic stenosis with maximum velocity of 2.4 m/s, mean gradient of 80 mmHg, and valve area of 0.97 m. She had significant dyspnea on exertion. Even her findings I recommended coronary angiogram with aortic valve area measurement and coronary angioplasty as necessary.  Simultaneous LV-AO measurement was performed using Langston catheter. Mean PG was only 20 mmhg, even though AVA was 0.75 cm2.  LVEF is normal 55-60% and cardiac output is also normal for her body size.This makes severe stage D3 AS less likely. Symptoms  are likely due to multivessel CAD. Syntax score was 11. Patient and family had indicated prior to this procedure that they would prefer percutaneous treatment options over CABG. Her Syntax II score indicates CABG or PCI as equivalent options. Thus, we proceeded with plan for staged PCI.  He performed PCI to her proximal to mid RCA 4 severe 80% stenosis with a resolute on next 3.5 and 60m for drug-eluting stent. We planned to perform LAD and circumflex PCI in a staged manner, possibly with atherectomy. Given significant right radial artery loop we decided to perform this from femoral artery on a later date.  Moderate aortic stenosis with mean pressure gradient of 18 mmHg on catheter as well as echocardiogram. Severe multivessel coronary artery disease with low syntax score. She underwent successful RCA PCI last week. I've discussed the option of LAD and circumflex PCI with atherectomy in detail with patient and daughter.  Schedule for cardiac catheterization, and possible angioplasty. We discussed regarding risks, benefits, alternatives to this including stress testing, CTA and continued medical therapy. Patient wants to proI discuss the risks and benefits of atherectomy in severely calcified coronary artery disease. She understands <2-3% risk of death, stroke, MI, urgent CABG, bleeding, infection, renal failure but not limited to these.   Continue ASA/plavix, atenolol 100 mg daily, rosuvstatin, amlodipine, lisinopril.   Percutaneous intervention: See details below  Access: 6 Fr Rt femoral long sheath Exchanged for short sheath, left sutured.   Anticoagulation:  11,000 units heparin Antiplatelet agents: ASA 81 mg mg + clopidogrel 75 mg  Total contrast used: 325 cc    Coronary Findings   Diagnostic  Dominance: Right  Left Anterior Descending  Prox LAD to  Mid LAD lesion 90% stenosed  The lesion is calcified.  Left Circumflex  Prox Cx to Mid Cx lesion 90% stenosed  The lesion is calcified.  Right Coronary Artery  Prox RCA lesion 0% stenosed  Prox RCA lesion with no stenosis was previously treated.  Intervention   Prox LAD to Mid LAD lesion  Angioplasty  Lesion length: 30 mm. Lesion crossed with guidewire using a WIRE COUGAR XT STRL 300CM. Pre-stent angioplasty was performed using a BALLOON SAPPHIRE 2.5X10. Maximum pressure: 10 atm. Inflation time: 30 sec. A STENT SYNERGY DES 3X24 drug eluting stent was successfully placed. Stent strut is well apposed. Post-stent angioplasty was performed using a STENT SYNERGY DES 3X24. Pre-stent angioplasty was not performed was performed. A STENT SYNERGY DES 3.5X8 drug-eluting stent was successfully placed. Stent strut is well apposed. Stent 2 overlaps at the proximal edge. The pre-interventional distal flow is decreased (TIMI 2). The post-interventional distal flow is normal (TIMI 3). The intervention was successful . No complications occurred at this lesion.  Atherectomy  Orbital atherectomy was performed using a CROWN DIAMONDBACK CLASSIC 1.25. The intervention was successful.  There is a 0% residual stenosis post intervention.  Prox Cx to Mid Cx lesion  Angioplasty  Lesion length: 40 mm. Lesion crossed with guidewire using a WIRE COUGAR XT STRL 300CM. Pre-stent angioplasty was performed using a BALLOON SAPPHIRE 2.5X10. Maximum pressure: 12 atm. Inflation time: 30 sec. A STENT SYNERGY DES 2.5X20 drug eluting stent was successfully placed, and overlaps previously placed stent. Stent strut is well apposed. Post-stent angioplasty was performed using a STENT SYNERGY DES 2.5X20. Maximum pressure: 12 atm. Inflation time: 30 sec. Pre-stent angioplasty was performed using a BALLOON SAPPHIRE 2.5X10. A STENT SYNERGY DES 3X20 drug-eluting stent was successfully placed. Stent strut is well apposed. Stent 2 overlaps at the  proximal edge. Post-stent angioplasty was performed using a STENT SYNERGY DES 3X20. Pre-stent angioplasty was performed. A STENT SYNERGY DES 2.5X12 drug-eluting stent was successfully placed. Stent strut is well apposed. Stent 3 overlaps at the distal edge. Post-stent angioplasty was not performed was performed. The pre-interventional distal flow is normal (TIMI 3). The post-interventional distal flow is normal (TIMI 3). The intervention was successful . No complications occurred at this lesion. IVUS was performed on the lesion post PCI . IVUS catheter passed, however malfunction occurred due to kinking of the catheter. Limited images obtained.  Atherectomy  Orbital atherectomy was performed using a CROWN DIAMONDBACK CLASSIC 1.25. The intervention was successful.  There is a 0% residual stenosis post intervention.  Coronary Diagrams   Diagnostic  Dominance: Right    Intervention      EKG:  EKG is not ordered today.  The ekg from 09-22-2017 demonstrates NSR 69 bpm, within normal limits. No IVCD.  Recent Labs: 05/02/2019: BNP 189.0; BUN 24; Creatinine, Ser 1.21; Hemoglobin 13.6; Platelets 284; Potassium 5.0; Sodium 138  Recent Lipid Panel    Component Value Date/Time   CHOL 204 (H) 05/02/2019 0904   TRIG 129 05/02/2019 0904   HDL 43 05/02/2019 0904   CHOLHDL 4.7 (H) 05/02/2019 0904   LDLCALC 135 (H) 05/02/2019 0904    Physical Exam:    VS:  BP 122/62    Pulse 70    Ht _0  (1.626 m)    Wt 112 lb (50.8 kg)    SpO2 99%    BMI 19.22 kg/m     Wt Readings from Last 3 Encounters:  05/08/19 112 lb (50.8 kg)  05/01/19 110 lb (49.9 kg)  09/21/17 115 lb (52.2 kg)     GEN: Well nourished, well developed pleasant elderly woman in no acute distress HEENT: Normal NECK: No JVD; BL carotid bruits LYMPHATICS: No lymphadenopathy CARDIAC: RRR, late peaking 3/6 harsh systolic murmur at the right upper sternal border, diminished A2, no diastolic murmur RESPIRATORY:  Clear to auscultation without  rales, wheezing or rhonchi  ABDOMEN: Soft, non-tender, non-distended MUSCULOSKELETAL:  No edema; No deformity  SKIN: Warm and dry NEUROLOGIC:  Alert and oriented x 3 PSYCHIATRIC:  Normal affect   STS RIsk Calculator: Risk of Mortality: 5.319% Renal Failure: 2.775% Permanent Stroke: 2.395% Prolonged Ventilation: 11.409% DSW Infection: 0.058% Reoperation: 4.970% Morbidity or Mortality: 16.400% Short Length of Stay: 16.397% Long Length of Stay: 9.521%  ASSESSMENT:    1. Nonrheumatic aortic valve stenosis    PLAN:    This is an 85 year old woman with New York Heart Association functional class II symptoms of chronic diastolic heart failure in the setting of severe aortic stenosis.  I have personally reviewed the patient's echo images with Dr. Virgina Jock.  Findings are notable for preserved LV systolic function with a small, hypertrophic left ventricle.  There is very severe calcification and restriction of the aortic valve leaflets with Doppler findings demonstrating a peak velocity of 3.4 m/s, peak and mean gradients of 48 and 28 mmHg respectively, dimensionless index 0.2, and calculated valve area of 0.7 cm.  These findings are suggestive of paradoxical low flow low gradient aortic stenosis with preserved LV systolic function.  I have reviewed the natural history of aortic stenosis with the patient and their family members who are present today. We have discussed the limitations of medical therapy and the poor prognosis associated with symptomatic aortic stenosis. We have reviewed potential treatment options, including palliative medical therapy, conventional surgical aortic valve replacement,  and transcatheter aortic valve replacement. We discussed treatment options in the context of the patient's specific comorbid medical conditions.  Considering the patient's advanced age, TAVR would likely be the safest treatment option for this patient.  She remains functionally independent and  lives at home with her daughter.  She understands the need to continue further evaluation with a right and left heart catheterization and possible PCI, gated CT angiogram of the heart, and CT angiogram of the chest/abdomen/pelvis.    I have reviewed the risks, indications, and alternatives to cardiac catheterization, possible angioplasty, and stenting with the patient. Risks include but are not limited to bleeding, infection, vascular injury, stroke, myocardial infection, arrhythmia, kidney injury, radiation-related injury in the case of prolonged fluoroscopy use, emergency cardiac surgery, and death. The patient understands the risks of serious complication is 1-2 in 1610 with diagnostic cardiac cath and 1-2% or less with angioplasty/stenting. Cardiac catheterization will be arranged with her primary cardiologist, Dr Virgina Jock.    Once her studies are completed, she will be referred for a formal surgical consultation as part of a multidisciplinary approach to her care.  We discussed the TAVR procedure at length and I demonstrated a video animation of transfemoral TAVR to the patient and her daughter today.  We discussed potential complications and expected recovery from the procedure.  We will follow-up with the multidisciplinary heart team once her studies are completed.   Medication Adjustments/Labs and Tests Ordered: Current medicines are reviewed at length with the patient today.  Concerns regarding medicines are outlined above.  Orders Placed This Encounter  Procedures   CT CORONARY MORPH W/CTA COR W/SCORE W/CA W/CM &/OR WO/CM   CT ANGIO ABDOMEN PELVIS  W &/OR WO CONTRAST   CT ANGIO CHEST AORTA W &/OR WO CONTRAST   No orders of the defined types were placed in this encounter.   Patient Instructions  Medication Instructions:  Your provider recommends that you continue on your current medications as directed. Please refer to the Current Medication list given to you today.     Follow-Up: Dr. Virgina Jock will call you to arrange your heart catheterization. After your heart cath, we call arrange the rest of your TAVR testing.  Lauren, the Structural Heart Navigator, will contact you to arrange further appointments. Her direct number is [phone].    Signed, Sherren Mocha, MD  05/11/2019 4:36 PM    [facility]

## 2019-05-08 NOTE — Patient Instructions (Signed)
Medication Instructions:  Your provider recommends that you continue on your current medications as directed. Please refer to the Current Medication list given to you today.    Follow-Up: Dr. Virgina Jock will call you to arrange your heart catheterization. After your heart cath, we call arrange the rest of your TAVR testing.  Lauren, the Structural Heart Navigator, will contact you to arrange further appointments. Her direct number is [phone].

## 2019-05-12 ENCOUNTER — Other Ambulatory Visit (HOSPITAL_COMMUNITY)
Admission: RE | Admit: 2019-05-12 | Discharge: 2019-05-12 | Disposition: A | Discharge status: Home / Self Care | Payer: Medicare Other | Source: Ambulatory Visit | Attending: Cardiology | Admitting: Cardiology

## 2019-05-12 ENCOUNTER — Other Ambulatory Visit: Payer: Self-pay

## 2019-05-12 DIAGNOSIS — Z01812 Encounter for preprocedural laboratory examination: Secondary | ICD-10-CM | POA: Diagnosis not present

## 2019-05-12 DIAGNOSIS — Z1159 Encounter for screening for other viral diseases: Secondary | ICD-10-CM | POA: Insufficient documentation

## 2019-05-13 LAB — NOVEL CORONAVIRUS, NAA (HOSP ORDER, SEND-OUT TO REF LAB; TAT 18-24 HRS): SARS-CoV-2, NAA: NOT DETECTED

## 2019-05-16 ENCOUNTER — Other Ambulatory Visit: Payer: Self-pay

## 2019-05-16 ENCOUNTER — Ambulatory Visit (HOSPITAL_COMMUNITY)
Admission: RE | Admit: 2019-05-16 | Discharge: 2019-05-16 | Disposition: A | Discharge status: Home / Self Care | Payer: Medicare Other | Attending: Cardiology | Admitting: Cardiology

## 2019-05-16 ENCOUNTER — Encounter (HOSPITAL_COMMUNITY)
Admission: RE | Disposition: A | Discharge status: Home / Self Care | Payer: Self-pay | Source: Home / Self Care | Attending: Cardiology

## 2019-05-16 DIAGNOSIS — Z79899 Other long term (current) drug therapy: Secondary | ICD-10-CM | POA: Diagnosis not present

## 2019-05-16 DIAGNOSIS — I350 Nonrheumatic aortic (valve) stenosis: Secondary | ICD-10-CM | POA: Diagnosis not present

## 2019-05-16 DIAGNOSIS — I480 Paroxysmal atrial fibrillation: Secondary | ICD-10-CM | POA: Insufficient documentation

## 2019-05-16 DIAGNOSIS — R011 Cardiac murmur, unspecified: Secondary | ICD-10-CM | POA: Diagnosis not present

## 2019-05-16 DIAGNOSIS — E7800 Pure hypercholesterolemia, unspecified: Secondary | ICD-10-CM | POA: Diagnosis not present

## 2019-05-16 DIAGNOSIS — Z955 Presence of coronary angioplasty implant and graft: Secondary | ICD-10-CM | POA: Insufficient documentation

## 2019-05-16 DIAGNOSIS — Z800 Family history of malignant neoplasm of digestive organs: Secondary | ICD-10-CM | POA: Diagnosis not present

## 2019-05-16 DIAGNOSIS — I10 Essential (primary) hypertension: Secondary | ICD-10-CM | POA: Insufficient documentation

## 2019-05-16 DIAGNOSIS — I2510 Atherosclerotic heart disease of native coronary artery without angina pectoris: Secondary | ICD-10-CM | POA: Insufficient documentation

## 2019-05-16 DIAGNOSIS — I25118 Atherosclerotic heart disease of native coronary artery with other forms of angina pectoris: Secondary | ICD-10-CM | POA: Diagnosis present

## 2019-05-16 DIAGNOSIS — Z7901 Long term (current) use of anticoagulants: Secondary | ICD-10-CM | POA: Insufficient documentation

## 2019-05-16 HISTORY — PX: RIGHT/LEFT HEART CATH AND CORONARY ANGIOGRAPHY: CATH118266

## 2019-05-16 LAB — POCT I-STAT 7, (LYTES, BLD GAS, ICA,H+H)
Acid-base deficit: 5 mmol/L — ABNORMAL HIGH (ref 0.0–2.0)
Bicarbonate: 19.3 mmol/L — ABNORMAL LOW (ref 20.0–28.0)
Calcium, Ion: 1.32 mmol/L (ref 1.15–1.40)
HCT: 37 % (ref 36.0–46.0)
Hemoglobin: 12.6 g/dL (ref 12.0–15.0)
O2 Saturation: 97 %
Potassium: 4.7 mmol/L (ref 3.5–5.1)
Sodium: 136 mmol/L (ref 135–145)
TCO2: 20 mmol/L — ABNORMAL LOW (ref 22–32)
pCO2 arterial: 32.3 mmHg (ref 32.0–48.0)
pH, Arterial: 7.385 (ref 7.350–7.450)
pO2, Arterial: 96 mmHg (ref 83.0–108.0)

## 2019-05-16 LAB — POCT I-STAT EG7
Acid-base deficit: 5 mmol/L — ABNORMAL HIGH (ref 0.0–2.0)
Bicarbonate: 20.2 mmol/L (ref 20.0–28.0)
Calcium, Ion: 1.35 mmol/L (ref 1.15–1.40)
HCT: 37 % (ref 36.0–46.0)
Hemoglobin: 12.6 g/dL (ref 12.0–15.0)
O2 Saturation: 68 %
Potassium: 4.6 mmol/L (ref 3.5–5.1)
Sodium: 132 mmol/L — ABNORMAL LOW (ref 135–145)
TCO2: 21 mmol/L — ABNORMAL LOW (ref 22–32)
pCO2, Ven: 39.4 mmHg — ABNORMAL LOW (ref 44.0–60.0)
pH, Ven: 7.319 (ref 7.250–7.430)
pO2, Ven: 38 mmHg (ref 32.0–45.0)

## 2019-05-16 SURGERY — RIGHT/LEFT HEART CATH AND CORONARY ANGIOGRAPHY
Anesthesia: LOCAL

## 2019-05-16 MED ORDER — MIDAZOLAM HCL 2 MG/2ML IJ SOLN
INTRAMUSCULAR | Status: AC
  Filled 2019-05-16: qty 2

## 2019-05-16 MED ORDER — VERAPAMIL HCL 2.5 MG/ML IV SOLN
INTRAVENOUS | Status: DC | PRN
  Administered 2019-05-16: 10 mL via INTRA_ARTERIAL

## 2019-05-16 MED ORDER — LIDOCAINE HCL (PF) 1 % IJ SOLN
INTRAMUSCULAR | Status: DC | PRN
  Administered 2019-05-16 (×2): 2 mL

## 2019-05-16 MED ORDER — HEPARIN (PORCINE) IN NACL 1000-0.9 UT/500ML-% IV SOLN
INTRAVENOUS | Status: AC
  Filled 2019-05-16: qty 1000

## 2019-05-16 MED ORDER — SODIUM CHLORIDE 0.9 % WEIGHT BASED INFUSION
3.0000 mL/kg/h | INTRAVENOUS | Status: AC
  Administered 2019-05-16: 11:00:00 3 mL/kg/h via INTRAVENOUS

## 2019-05-16 MED ORDER — IOHEXOL 350 MG/ML SOLN
INTRAVENOUS | Status: DC | PRN
  Administered 2019-05-16: 35 mL via INTRA_ARTERIAL

## 2019-05-16 MED ORDER — ACETAMINOPHEN 325 MG PO TABS: 650.0000 mg | ORAL_TABLET | ORAL | Status: DC | PRN

## 2019-05-16 MED ORDER — HEPARIN SODIUM (PORCINE) 1000 UNIT/ML IJ SOLN
INTRAMUSCULAR | Status: DC | PRN
  Administered 2019-05-16: 3000 [IU] via INTRAVENOUS

## 2019-05-16 MED ORDER — VERAPAMIL HCL 2.5 MG/ML IV SOLN
INTRAVENOUS | Status: AC
  Filled 2019-05-16: qty 2

## 2019-05-16 MED ORDER — HEPARIN (PORCINE) IN NACL 1000-0.9 UT/500ML-% IV SOLN
INTRAVENOUS | Status: DC | PRN
  Administered 2019-05-16 (×2): 500 mL

## 2019-05-16 MED ORDER — SODIUM CHLORIDE 0.9% FLUSH: 3.0000 mL | Freq: Two times a day (BID) | INTRAVENOUS | Status: DC

## 2019-05-16 MED ORDER — SODIUM CHLORIDE 0.9 % IV SOLN: 250.0000 mL | INTRAVENOUS | Status: DC | PRN

## 2019-05-16 MED ORDER — ASPIRIN 81 MG PO CHEW
81.0000 mg | CHEWABLE_TABLET | ORAL | Status: AC
  Administered 2019-05-16: 81 mg via ORAL
  Filled 2019-05-16: qty 1

## 2019-05-16 MED ORDER — SODIUM CHLORIDE 0.9% FLUSH: 3.0000 mL | INTRAVENOUS | Status: DC | PRN

## 2019-05-16 MED ORDER — LABETALOL HCL 5 MG/ML IV SOLN: 10.0000 mg | INTRAVENOUS | Status: DC | PRN

## 2019-05-16 MED ORDER — MIDAZOLAM HCL 2 MG/2ML IJ SOLN
INTRAMUSCULAR | Status: DC | PRN
  Administered 2019-05-16: 0.5 mg via INTRAVENOUS

## 2019-05-16 MED ORDER — FENTANYL CITRATE (PF) 100 MCG/2ML IJ SOLN
INTRAMUSCULAR | Status: AC
  Filled 2019-05-16: qty 2

## 2019-05-16 MED ORDER — HYDRALAZINE HCL 20 MG/ML IJ SOLN: 10.0000 mg | INTRAMUSCULAR | Status: DC | PRN

## 2019-05-16 MED ORDER — FENTANYL CITRATE (PF) 100 MCG/2ML IJ SOLN
INTRAMUSCULAR | Status: DC | PRN
  Administered 2019-05-16: 12.5 ug via INTRAVENOUS

## 2019-05-16 MED ORDER — SODIUM CHLORIDE 0.9 % WEIGHT BASED INFUSION: 1.0000 mL/kg/h | INTRAVENOUS | Status: DC

## 2019-05-16 MED ORDER — SODIUM CHLORIDE 0.9 % IV SOLN: INTRAVENOUS | Status: AC

## 2019-05-16 MED ORDER — ONDANSETRON HCL 4 MG/2ML IJ SOLN: 4.0000 mg | Freq: Four times a day (QID) | INTRAMUSCULAR | Status: DC | PRN

## 2019-05-16 MED ORDER — LIDOCAINE HCL (PF) 1 % IJ SOLN
INTRAMUSCULAR | Status: AC
  Filled 2019-05-16: qty 30

## 2019-05-16 SURGICAL SUPPLY — 17 items
CATH BALLN WEDGE 5F 110CM (CATHETERS) ×1 IMPLANT
CATH INFINITI 5FR AL1 (CATHETERS) ×1 IMPLANT
CATH INFINITI JR4 5F (CATHETERS) ×1 IMPLANT
CATH LANGSTON DUAL LUM PIG 6FR (CATHETERS) ×1 IMPLANT
DEVICE RAD COMP TR BAND LRG (VASCULAR PRODUCTS) ×1 IMPLANT
GLIDESHEATH SLEND A-KIT 6F 22G (SHEATH) ×1 IMPLANT
GUIDEWIRE .025 260CM (WIRE) ×1 IMPLANT
GUIDEWIRE INQWIRE 1.5J.035X260 (WIRE) IMPLANT
INQWIRE 1.5J .035X260CM (WIRE) ×2
KIT ESSENTIALS PG (KITS) ×1 IMPLANT
KIT HEART LEFT (KITS) ×2 IMPLANT
PACK CARDIAC CATHETERIZATION (CUSTOM PROCEDURE TRAY) ×2 IMPLANT
SHEATH GLIDE SLENDER 4/5FR (SHEATH) ×1 IMPLANT
TRANSDUCER W/STOPCOCK (MISCELLANEOUS) ×2 IMPLANT
TUBING CIL FLEX 10 FLL-RA (TUBING) ×2 IMPLANT
WIRE EMERALD ST .035X260CM (WIRE) ×1 IMPLANT
WIRE HI TORQ VERSACORE-J 145CM (WIRE) ×1 IMPLANT

## 2019-05-16 NOTE — Discharge Instructions (Signed)
Drink plenty of fluids over next 48 hours and keep right wrist elevated at heart level for 24 hours  Radial Site Care  This sheet gives you information about how to care for yourself after your procedure. Your health care provider may also give you more specific instructions. If you have problems or questions, contact your health care provider. What can I expect after the procedure? After the procedure, it is common to have:  Bruising and tenderness at the catheter insertion area. Follow these instructions at home: Medicines  Take over-the-counter and prescription medicines only as told by your health care provider. Insertion site care  Follow instructions from your health care provider about how to take care of your insertion site. Make sure you: ? Wash your hands with soap and water before you change your bandage (dressing). If soap and water are not available, use hand sanitizer. ? Remove your dressing as told by your health care provider. In 24-48 hours  Check your insertion site every day for signs of infection. Check for: ? Redness, swelling, or pain. ? Fluid or blood. ? Pus or a bad smell. ? Warmth.  Do not take baths, swim, or use a hot tub until your health care provider approves.  You may shower 24-48 hours after the procedure, or as directed by your health care provider. ? Remove the dressing and gently wash the site with plain soap and water. ? Pat the area dry with a clean towel. ? Do not rub the site. That could cause bleeding.  Do not apply powder or lotion to the site. Activity   For 24 hours after the procedure, or as directed by your health care provider: ? Do not flex or bend the affected arm. ? Do not push or pull heavy objects with the affected arm. ? Do not drive yourself home from the hospital or clinic. You may drive 24 hours after the procedure unless your health care provider tells you not to. ? Do not operate machinery or power tools.  Do not lift  anything that is heavier than 5 lb, or the limit that you are told, until your health care provider says that it is safe. For 5 days  Ask your health care provider when it is okay to: ? Return to work or school. ? Resume usual physical activities or sports. ? Resume sexual activity. General instructions  If the catheter site starts to bleed, raise your arm and put firm pressure on the site. If the bleeding does not stop, get help right away. This is a medical emergency.  If you went home on the same day as your procedure, a responsible adult should be with you for the first 24 hours after you arrive home.  Keep all follow-up visits as told by your health care provider. This is important. Contact a health care provider if:  You have a fever.  You have redness, swelling, or yellow drainage around your insertion site. Get help right away if:  You have unusual pain at the radial site.  The catheter insertion area swells very fast.  The insertion area is bleeding, and the bleeding does not stop when you hold steady pressure on the area.  Your arm or hand becomes pale, cool, tingly, or numb. These symptoms may represent a serious problem that is an emergency. Do not wait to see if the symptoms will go away. Get medical help right away. Call your local emergency services (911 in the [place] Do not drive yourself  to the hospital. Summary  After the procedure, it is common to have bruising and tenderness at the site.  Follow instructions from your health care provider about how to take care of your radial site wound. Check the wound every day for signs of infection.  Do not lift anything that is heavier than 10 lb (4.5 kg), or the limit that you are told, until your health care provider says that it is safe. This information is not intended to replace advice given to you by your health care provider. Make sure you discuss any questions you have with your health care provider. Document  Released: 12/19/2010 Document Revised: 12/22/2017 Document Reviewed: 12/22/2017 Elsevier Interactive Patient Education  2019 [employer].

## 2019-05-16 NOTE — Interval H&P Note (Signed)
History and Physical Interval Note:  05/16/2019 12:45 PM  Catherine Anthony  has presented today for surgery, with the diagnosis of CAD Pre tavr.  The various methods of treatment have been discussed with the patient and family. After consideration of risks, benefits and other options for treatment, the patient has consented to  Procedure(s): RIGHT/LEFT HEART CATH AND CORONARY ANGIOGRAPHY (N/A) as a surgical intervention.  The patient's history has been reviewed, patient examined, no change in status, stable for surgery.  I have reviewed the patient's chart and labs.  Questions were answered to the patient's satisfaction.    2012 Appropriate Use Criteria for Diagnostic Catheterization Home / Select Test of Interest Indication for RHC Valvular Disease Valvular Disease (Right and Left Heart Catheterization or Right Heart Catheterization Alone With or  Valvular Disease  (Right and Left Heart Catheterization or Right Heart Catheterization  Alone With or Without Left Ventriculography and Coronary Angiography) Link Here: [email or web address] Indication:  1. Preoperative assessment before valvular surgery A (7) Indication: 70; Score 7   Oscar Forman J Samanth Mirkin

## 2019-05-16 NOTE — H&P (Signed)
Office visit note copied for documentation.  Discussed with Dr. Burt Knack. Plan for potential TAVR. If severe coronary stenoses identified, will proceed with intervention before TAVR, as necessary.  Nigel Mormon, MD       Follow up visit  Subjective:   Catherine Anthony, female    DOB: September 22, 1930, 85 y.o.   MRN: [id number]   No chief complaint on file.    HPI  84 year old Caucasian female with hypertension, moderate aortic stenosis with moderate aortic regurgitation and mitral calcification, CAD s/p successful diamondback orbital atherectomy and multivessel PCI to LAD, left circumflex, proximal to mid RCA (08/2017), paroxysmal Afib, here for follow up.  Patient is here with her daughter. Over a period of time, she has developed shortness of breath on exertion, denies chest pain. She is still able perform her ADL's, and able to garden from time to time, but has certainly cut back her activity over a period of time. She had one episode of near syncope in 11/2018 that she attributed to being dehydrated. This episode has not occurred since.   Echocardiogram reviewed with the patient and daughter, details below.   Past Medical History:  Diagnosis Date  . Complication of anesthesia   . Coronary artery disease   . Family hx of colon cancer   . Heart murmur    "i've always had one"  . High cholesterol   . Hypertension   . Migraine    "none in years" (09/21/2017)  . PONV (postoperative nausea and vomiting)      Past Surgical History:  Procedure Laterality Date  . CATARACT EXTRACTION W/ INTRAOCULAR LENS IMPLANT Right 06/04/2011   [facility]  . CATARACT EXTRACTION W/PHACO  06/22/2011   Procedure: CATARACT EXTRACTION PHACO AND INTRAOCULAR LENS PLACEMENT (IOC);  Surgeon: Tonny Branch;  Location: [facility];  Service: Ophthalmology;  Laterality: Left;  CDE 14.34  . COLONOSCOPY  06/23/08  . COLONOSCOPY N/A 01/18/2014   Procedure: COLONOSCOPY;  Surgeon: Rogene Houston, MD;  Location: [facility];  Service: Endoscopy;  Laterality: N/A;  145  . CORONARY ANGIOPLASTY WITH STENT PLACEMENT    . CORONARY ATHERECTOMY N/A 09/21/2017   Procedure: CORONARY ATHERECTOMY;  Surgeon: Nigel Mormon, MD;  Location: [facility];  Service: Cardiovascular;  Laterality: N/A;  . CORONARY STENT INTERVENTION N/A 08/31/2017   Procedure: CORONARY STENT INTERVENTION;  Surgeon: Nigel Mormon, MD;  Location: [facility];  Service: Cardiovascular;  Laterality: N/A;  . CORONARY STENT INTERVENTION N/A 09/21/2017   Procedure: CORONARY STENT INTERVENTION;  Surgeon: Nigel Mormon, MD;  Location: [facility];  Service: Cardiovascular;  Laterality: N/A;  . EYE SURGERY Bilateral    "after cataract OR"  . INTRAVASCULAR ULTRASOUND/IVUS N/A 09/21/2017   Procedure: Intravascular Ultrasound/IVUS;  Surgeon: Nigel Mormon, MD;  Location: [place] [facility];  Service: Cardiovascular;  Laterality: N/A;  . RIGHT/LEFT HEART CATH AND CORONARY ANGIOGRAPHY N/A 08/31/2017   Procedure: RIGHT/LEFT HEART CATH AND CORONARY ANGIOGRAPHY;  Surgeon: Nigel Mormon, MD;  Location: [place] [facility];  Service: Cardiovascular;  Laterality: N/A;  . ULTRASOUND GUIDANCE FOR VASCULAR ACCESS  09/21/2017   Procedure: Ultrasound Guidance For Vascular Access;  Surgeon: Nigel Mormon, MD;  Location: [place] [facility];  Service: Cardiovascular;;  . VARICOSE VEIN SURGERY Bilateral 1960s   [facility]     Social History   Socioeconomic History  . Marital status: Widowed    Spouse name: Not on file  . Number of children:  5  . Years of education: Not on file  . Highest education level: Not on file  Occupational History  . Not on file  Social Needs  . Financial resource strain: Not on file  . Food insecurity    Worry: Not on file    Inability: Not on file  . Transportation needs    Medical: Not on file    Non-medical: Not on file  Tobacco Use  . Smoking status: Never Smoker  .  Smokeless tobacco: Never Used  Substance and Sexual Activity  . Alcohol use: No  . Drug use: No  . Sexual activity: Not on file  Lifestyle  . Physical activity    Days per week: Not on file    Minutes per session: Not on file  . Stress: Not on file  Relationships  . Social Herbalist on phone: Not on file    Gets together: Not on file    Attends religious service: Not on file    Active member of club or organization: Not on file    Attends meetings of clubs or organizations: Not on file    Relationship status: Not on file  . Intimate partner violence    Fear of current or ex partner: Not on file    Emotionally abused: Not on file    Physically abused: Not on file    Forced sexual activity: Not on file  Other Topics Concern  . Not on file  Social History Narrative  . Not on file     No family history on file.   No current facility-administered medications on file prior to encounter.    Current Outpatient Medications on File Prior to Encounter  Medication Sig Dispense Refill  . amLODipine (NORVASC) 10 MG tablet Take 10 mg by mouth daily.     Marland Kitchen atenolol (TENORMIN) 100 MG tablet Take 100 mg by mouth daily.     . diphenhydrAMINE (BENADRYL) 25 MG tablet Take 25 mg by mouth daily as needed for allergies.    Marland Kitchen ELIQUIS 2.5 MG TABS tablet TAKE (1) TABLET BY MOUTH TWICE DAILY. (Patient taking differently: Take 2.5 mg by mouth 2 (two) times daily. ) 180 tablet 3  . lisinopril (PRINIVIL,ZESTRIL) 40 MG tablet Take 40 mg by mouth daily.     Marland Kitchen spironolactone (ALDACTONE) 25 MG tablet Take 25 mg by mouth daily.       Cardiovascular studies:  Echocardiogram 05/01/2019: Normal LV systolic function with EF 66%. Left ventricle cavity is normal in size. Normal global wall motion. Diastolic function assessment limited due to degree of mitral annular calcification (MAC). Left atrial cavity is mildly dilated. Right atrial cavity is moderately dilated. Trileaflet aortic valve with  severe aortic valve leaflet calcification. Aortic valve mean gradient of 28 mmHg, Vmax of 3.4 m/s. Calculated aortic valve area by continuity equation is 0.7 cm. Dimensionless index 0.20 suggests paradoxically low gradient severe aortic stenosis. Moderate aortic regurgitation noted.  Moderate mitral valve leaflet calcification. Mild mitral regurgitation. Moderate tricuspid regurgitation. Estimated pulmonary artery systolic pressure 24 mmHg. Compared to previous study on 07/17/2018, there is progression of aortic stenosis.  Echocardiogram 07/27/2018: Left ventricle cavity is normal in size. Mild concentric hypertrophy of the left ventricle. Normal global wall motion. Diastolic function assessment limited due to degree of mitral annular calcification (MAC) Calculated EF 56%. Moderate calcification of the aortic valve annulus and leaflets. Aortic valve mean gradient of 20 mmHg, Vmax of 2.9  m/s. Calculated aortic valve area  by continuity equation is 0.9 cm, AVA 0.6 cm2/m2. While AVA meets criteria for severe aortic stenosis, mean PG, peak velocity and dimensionless index of 0.27 suggest moderate aortic stenosis. Moderate (Grade III) aortic regurgitation. Moderate calcification of the mitral valve annulus. Mild mitral valve leaflet calcification. Mildly restricted mitral valve leaflets without significant stenosis.  Mild to moderate mitral regurgitation. Moderate tricuspid regurgitation. Estimated pulmonary artery systolic pressure 30  mmHg. No significant change compared to previous study on 08/05/2017.  Carotid artery duplex 12/28/2017: Minimal stenosis in the right internal carotid artery (1-15%). Left vertebral artery waveform is biphasic and suggests proximal left subclavian artery stenosis.  Antegrade right vertebral artery flow. Further studies if clinically indicated.  Coronary atherectomy and angioplasty 09/21/2017: Successful diamondback orbital atherectomy and two-vessel PCI PTCA and  overlapping drug-eluting stents placement LCx Synergy DES 2.5 x 12 mm Synergy DES 2.5 x 20 mm Synergy DES 3.0 X 20 mm PTCA and overlapping drug-eluting stents placement LAD Synergy DES 3.0 x 24 mm Synergy DES 3.5 X 8 mm  Coronary angioram, LHC/RHC, RCA PCI 09/01/2017: Moderate calcific aortic and mitral valve stenosis Severe multivessel CAD Staged PCI to mid-prox RCA Successful PTCA and stent placement Resolute Onyx 3.5 X 26 mm DES  Moderate calcific aortic and mitral valve stenosis Severe multivessel CAD Staged PCI to mid-prox RCA Successful PTCA and stent placement Resolute Onyx 3.5 X 26 mm DES  Recent labs: 08/15/2018: Glucose 87. BUN/Cr 18/1.05. eGFR 48. Na/K 139/4.5  01/24/2018: Cholesterol 143, triglycerides 71, HDL 53, LDL 76. Glucose 84.  BUN/creatinine 22/0.9.  EGFR 67, sodium 137, potassium 4.3 H/H 13.2/39.9.  MCV 95.  Platelets 264   Review of Systems  Constitution: Negative for decreased appetite, malaise/fatigue, weight gain and weight loss.  HENT: Negative for congestion.   Eyes: Negative for visual disturbance.  Cardiovascular: Positive for dyspnea on exertion. Negative for chest pain, leg swelling, palpitations and syncope.  Respiratory: Positive for shortness of breath. Negative for cough.   Endocrine: Negative for cold intolerance.  Hematologic/Lymphatic: Does not bruise/bleed easily.  Skin: Negative for itching and rash.  Musculoskeletal: Negative for myalgias.  Gastrointestinal: Negative for abdominal pain, nausea and vomiting.  Genitourinary: Negative for dysuria.  Neurological: Negative for dizziness and weakness.  Psychiatric/Behavioral: The patient is not nervous/anxious.   All other systems reviewed and are negative.        Vitals:   05/16/19 1054  BP: 133/69  Pulse: 60  Resp: 16  SpO2: 99%    Body mass index is 18.88 kg/m. Filed Weights   05/16/19 1054  Weight: 49.9 kg     Objective:   Physical Exam  Constitutional: She is  oriented to person, place, and time. She appears well-developed and well-nourished. No distress.  HENT:  Head: Normocephalic and atraumatic.  Eyes: Pupils are equal, round, and reactive to light. Conjunctivae are normal.  Neck: No JVD present.  Cardiovascular: Normal rate and regular rhythm.  Murmur heard.  Harsh midsystolic murmur is present with a grade of 4/6 at the upper right sternal border radiating to the neck. Very soft S2. Pulses:      Carotid pulses are on the right side with bruit and on the left side with bruit.      Femoral pulses are 3+ on the right side and 3+ on the left side.      Popliteal pulses are 2+ on the right side and 1+ on the left side.       Dorsalis pedis pulses are 0 on the  right side and 0 on the left side.       Posterior tibial pulses are 0 on the left side.  Pulmonary/Chest: Effort normal and breath sounds normal. She has no wheezes. She has no rales.  Abdominal: Soft. Bowel sounds are normal. There is no rebound.  Musculoskeletal:        General: No edema.  Lymphadenopathy:    She has no cervical adenopathy.  Neurological: She is alert and oriented to person, place, and time. No cranial nerve deficit.  Skin: Skin is warm and dry.  Psychiatric: She has a normal mood and affect.  Nursing note and vitals reviewed.         Assessment & Recommendations:   85 year old Caucasian female with hypertension, moderate aortic stenosis with moderate aortic regurgitation and mitral calcification, CAD s/p successful diamondback orbital atherectomy and multivessel PCI to LAD, left circumflex, proximal to mid RCA (08/2017), paroxysmal Afib, here for follow up.  1. Nonrheumatic aortic valve stenosis Progression of aortic stenosis, what now appears to be severe aortic stenosis, likely paradoxically low gradient severe AS. She also has moderate aortic regurgitation. Given her symptoms of exertional dyspnea and near syncope, I recommend evaluation for TAVR. If felt to  be a candidate for TAVR, I will then proceed with TEE and diagnostic RHC/LHC. Will check CBC, BMP, BNP.  2. Exertional dyspnea Likely due to the above. Stable. Continue current medical therapy.   3. Coronary artery disease involving native coronary artery of native heart without angina pectoris No angina symptoms. It appeared that she had been off crestor. Refilled at 10 mg daily. Will check lipid panel.     Nigel Mormon, MD [facility] [place] Pager: [phone] Office: [phone] If no answer Cell [phone]

## 2019-05-17 ENCOUNTER — Other Ambulatory Visit: Payer: Self-pay

## 2019-05-17 ENCOUNTER — Encounter (HOSPITAL_COMMUNITY): Payer: Self-pay | Admitting: Cardiology

## 2019-05-17 DIAGNOSIS — N289 Disorder of kidney and ureter, unspecified: Secondary | ICD-10-CM

## 2019-05-17 DIAGNOSIS — I350 Nonrheumatic aortic (valve) stenosis: Secondary | ICD-10-CM

## 2019-05-19 ENCOUNTER — Encounter: Payer: Self-pay | Admitting: Physician Assistant

## 2019-05-22 ENCOUNTER — Ambulatory Visit (HOSPITAL_BASED_OUTPATIENT_CLINIC_OR_DEPARTMENT_OTHER)
Admission: RE | Admit: 2019-05-22 | Discharge: 2019-05-22 | Disposition: A | Discharge status: Home / Self Care | Payer: Medicare Other | Source: Ambulatory Visit | Attending: Cardiovascular Disease | Admitting: Cardiovascular Disease

## 2019-05-22 ENCOUNTER — Telehealth: Payer: Self-pay | Admitting: Cardiovascular Disease

## 2019-05-22 ENCOUNTER — Other Ambulatory Visit: Payer: Self-pay

## 2019-05-22 ENCOUNTER — Ambulatory Visit (HOSPITAL_COMMUNITY)
Admission: RE | Admit: 2019-05-22 | Discharge: 2019-05-22 | Disposition: A | Discharge status: Home / Self Care | Payer: Medicare Other | Source: Ambulatory Visit | Attending: Cardiology | Admitting: Cardiology

## 2019-05-22 ENCOUNTER — Ambulatory Visit (HOSPITAL_COMMUNITY)
Admission: RE | Admit: 2019-05-22 | Discharge: 2019-05-22 | Disposition: A | Discharge status: Home / Self Care | Payer: Medicare Other | Source: Ambulatory Visit | Attending: Cardiovascular Disease | Admitting: Cardiovascular Disease

## 2019-05-22 DIAGNOSIS — I350 Nonrheumatic aortic (valve) stenosis: Secondary | ICD-10-CM

## 2019-05-22 DIAGNOSIS — N289 Disorder of kidney and ureter, unspecified: Secondary | ICD-10-CM

## 2019-05-22 DIAGNOSIS — Z01810 Encounter for preprocedural cardiovascular examination: Secondary | ICD-10-CM | POA: Diagnosis not present

## 2019-05-22 LAB — BASIC METABOLIC PANEL
Anion gap: 8 (ref 5–15)
BUN: 23 mg/dL (ref 8–23)
CO2: 23 mmol/L (ref 22–32)
Calcium: 10 mg/dL (ref 8.9–10.3)
Chloride: 104 mmol/L (ref 98–111)
Creatinine, Ser: 1.26 mg/dL — ABNORMAL HIGH (ref 0.44–1.00)
GFR calc Af Amer: 45 mL/min — ABNORMAL LOW (ref >60)
GFR calc non Af Amer: 39 mL/min — ABNORMAL LOW (ref >60)
Glucose, Bld: 97 mg/dL (ref 70–99)
Potassium: 4.4 mmol/L (ref 3.5–5.1)
Sodium: 135 mmol/L (ref 135–145)

## 2019-05-22 MED ORDER — SODIUM BICARBONATE BOLUS VIA INFUSION
INTRAVENOUS | Status: AC
  Administered 2019-05-22: 75 meq via INTRAVENOUS
  Filled 2019-05-22: qty 1

## 2019-05-22 MED ORDER — SODIUM BICARBONATE 8.4 % IV SOLN
INTRAVENOUS | Status: DC
  Filled 2019-05-22 (×2): qty 500

## 2019-05-22 MED ORDER — IOHEXOL 350 MG/ML SOLN
100.0000 mL | Freq: Once | INTRAVENOUS | Status: AC | PRN
  Administered 2019-05-22: 100 mL via INTRAVENOUS

## 2019-05-22 NOTE — Telephone Encounter (Signed)
Echo cd received from [facility]. Placed in box for Dr. Burt Knack for review. 05/22/19 vlm

## 2019-05-23 ENCOUNTER — Encounter: Payer: Self-pay | Admitting: Physical Therapy

## 2019-05-23 ENCOUNTER — Other Ambulatory Visit: Payer: Self-pay

## 2019-05-23 ENCOUNTER — Ambulatory Visit: Payer: Medicare Other | Attending: Cardiovascular Disease | Admitting: Physical Therapy

## 2019-05-23 ENCOUNTER — Encounter: Payer: Medicare Other | Admitting: Thoracic Surgery (Cardiothoracic Vascular Surgery)

## 2019-05-23 DIAGNOSIS — R2689 Other abnormalities of gait and mobility: Secondary | ICD-10-CM | POA: Insufficient documentation

## 2019-05-23 NOTE — Therapy (Signed)
[facility] [address] [place], [place], [address] Phone: [phone]   Fax:  [phone]  Physical Therapy Evaluation  Patient Details  Name: Catherine Anthony MRN: [id number] Date of Birth: 06/10/1930 Referring Provider (PT): Tonny BollmanMichael cooper Md   Encounter Date: 05/23/2019  PT End of Session - 05/23/19 1331    Visit Number  1    Number of Visits  1    Date for PT Re-Evaluation  05/23/19    PT Start Time  1330    PT Stop Time  1359    PT Time Calculation (min)  29 min    Behavior During Therapy  [facility] for tasks assessed/performed       Past Medical History:  Diagnosis Date  . Complication of anesthesia   . Coronary artery disease   . Family hx of colon cancer   . High cholesterol   . Hypertension   . Migraine    "none in years" (09/21/2017)    Past Surgical History:  Procedure Laterality Date  . CATARACT EXTRACTION W/ INTRAOCULAR LENS IMPLANT Right 06/04/2011   [facility]  . CATARACT EXTRACTION W/PHACO  06/22/2011   Procedure: CATARACT EXTRACTION PHACO AND INTRAOCULAR LENS PLACEMENT (IOC);  Surgeon: Gemma PayorKerry Hunt;  Location: [facility];  Service: Ophthalmology;  Laterality: Left;  CDE 14.34  . COLONOSCOPY  06/23/08  . COLONOSCOPY N/A 01/18/2014   Procedure: COLONOSCOPY;  Surgeon: Malissa HippoNajeeb U Rehman, MD;  Location: [facility];  Service: Endoscopy;  Laterality: N/A;  145  . CORONARY ANGIOPLASTY WITH STENT PLACEMENT    . CORONARY ATHERECTOMY N/A 09/21/2017   Procedure: CORONARY ATHERECTOMY;  Surgeon: Elder NegusPatwardhan, Manish J, MD;  Location: [facility] CV [facility];  Service: Cardiovascular;  Laterality: N/A;  . CORONARY STENT INTERVENTION N/A 08/31/2017   Procedure: CORONARY STENT INTERVENTION;  Surgeon: Elder NegusPatwardhan, Manish J, MD;  Location: [facility];  Service: Cardiovascular;  Laterality: N/A;  . CORONARY STENT INTERVENTION N/A 09/21/2017   Procedure: CORONARY STENT INTERVENTION;  Surgeon: Elder NegusPatwardhan, Manish J, MD;  Location: [facility]  CV [facility];  Service: Cardiovascular;  Laterality: N/A;  . EYE SURGERY Bilateral    "after cataract OR"  . INTRAVASCULAR ULTRASOUND/IVUS N/A 09/21/2017   Procedure: Intravascular Ultrasound/IVUS;  Surgeon: Elder NegusPatwardhan, Manish J, MD;  [facility]: [facility];  Service: Cardiovascular;  Laterality: N/A;  . RIGHT/LEFT HEART CATH AND CORONARY ANGIOGRAPHY N/A 08/31/2017   Procedure: RIGHT/LEFT HEART CATH AND CORONARY ANGIOGRAPHY;  Surgeon: Elder NegusPatwardhan, Manish J, MD;  [facility];  Service: Cardiovascular;  Laterality: N/A;  . RIGHT/LEFT HEART CATH AND CORONARY ANGIOGRAPHY N/A 05/16/2019   Procedure: RIGHT/LEFT HEART CATH AND CORONARY ANGIOGRAPHY;  Surgeon: Elder NegusPatwardhan, Manish J, MD;  [facility];  Service: Cardiovascular;  Laterality: N/A;  . ULTRASOUND GUIDANCE FOR VASCULAR ACCESS  09/21/2017   Procedure: Ultrasound Guidance For Vascular Access;  Surgeon: Elder NegusPatwardhan, Manish J, MD;  Location: [facility];  Service: Cardiovascular;;  . VARICOSE VEIN SURGERY Bilateral 1960s   [facility]    There were no vitals filed for this visit.   Subjective Assessment - 05/23/19 1330    Subjective  pt is a 85 y.o F with CC of SOB that occurs with activity reporting it has been progressively worsening over the last 6-8 months. she reports she does get general fatigue but she denies chest pain or any dizziness.    Patient Stated Goals  to get heart better    Currently in Pain?  No/denies  [facility] - 05/23/19 0001      Assessment   Medical Diagnosis  Severe Aortic Stenosis    Referring Provider (PT)  Casimiro NeedleMichael cooper Md    Onset Date/Surgical Date  --   6-8 months   Hand Dominance  Right      Precautions   Precautions  None      Restrictions   Weight Bearing Restrictions  No      Balance Screen   Has the patient fallen in the past 6 months  No      Home Environment   Living Environment  Private residence    Living Arrangements  Children     Home Access  Stairs to enter    Entrance Stairs-Number of Steps  5    Entrance Stairs-Rails  Can reach both    Home Layout  Two level    Alternate Level Stairs-Number of Steps  16    Alternate Level Stairs-Rails  Can reach both      Cognition   Overall Cognitive Status  Within Functional Limits for tasks assessed      ROM / Strength   AROM / PROM / Strength  AROM;Strength      AROM   Overall AROM   Within functional limits for tasks performed      Strength   Overall Strength  Within functional limits for tasks performed    Right Hand Grip (lbs)  28    Left Hand Grip (lbs)  42      Ambulation/Gait   Gait Pattern  Step-through pattern;Decreased stride length;Trendelenburg;Decreased trunk rotation       [facility] Pre-Surgical Assessment - 05/23/19 0001    5 Meter Walk Test- trial 1  4 sec    5 Meter Walk Test- trial 2  5 sec.     5 Meter Walk Test- trial 3  5 sec.    5 meter walk test average  4.67 sec    4 Stage Balance Test tolerated for:   3 sec.    4 Stage Balance Test Position  1    Sit To Stand Test- trial 1  16 sec.    ADL/IADL Independent with:  Bathing;Dressing;Meal prep;Finances    ADL/IADL Needs Assistance with:  [employer] work    ADL/IADL Fraility Index  Midly frail    6 Minute Walk- Baseline  yes    BP (mmHg)  108/54    HR (bpm)  74    02 Sat (%RA)  99 %    Modified Borg Scale for Dyspnea  1- Very mild shortness of breath    Perceived Rate of Exertion (Borg)  6-    6 Minute Walk Post Test  yes    BP (mmHg)  114/55    HR (bpm)  90    02 Sat (%RA)  91 %    Modified Borg Scale for Dyspnea  2- Mild shortness of breath    Perceived Rate of Exertion (Borg)  13- Somewhat hard    Aerobic Endurance Distance Walked  940    Endurance additional comments  pt is 26.91% limited compared to age related norm              Objective measurements completed on examination: See above findings.                           Plan - 05/23/19 1415     Clinical Impression Statement  see assessment in  note    Clinical Decision Making  Low    PT Frequency  One time visit    PT Next Visit Plan  pre-TAVR evaluation      Clinical Impression Statement: Pt is a 85 yo F presenting to OP PT for evaluation prior to possible TAVR surgery due to severe aortic stenosis. Pt reports onset of SOB, fatigue approximately 6-8  months ago. Symptoms are limiting endurance. Pt presents with good ROM and strength, fair  balance and is assessed at moderate at the high fall risk 4 stage balance test, good walking speed and limited aerobic endurance per 6 minute walk test. Pt ambulated 940  feet without requiring rest break. At the end of 6 min walk test patient's HR was 90 bpm and O2 was 91 on room air. Pt reported 2/10 shortness of breath on modified scale for dyspnea. SOB, fatigue increased significantly with 6 minute walk test. Based on the Short Physical Performance Battery, patient has a frailty rating of 8/12 with </= 5/12 considered frail.    Patient demonstrated the following deficits and impairments:     Visit Diagnosis: 1. Other abnormalities of gait and mobility        Problem List Patient Active Problem List   Diagnosis Date Noted  . Exertional dyspnea 05/01/2019  . Coronary artery disease involving native coronary artery of native heart without angina pectoris 05/01/2019  . Moderate aortic regurgitation 05/01/2019  . Post PTCA 09/21/2017  . Coronary artery disease with stable angina pectoris ([place]) 08/31/2017  . Aortic stenosis 08/29/2017  . PERSONAL HISTORY OF COLONIC POLYPS 09/18/2009    [place] 05/23/2019, 2:18 PM  [facility] [address] [place], [place], [address] Phone: [phone]   Fax:  [phone]  Name: Catherine Anthony MRN: [id number] Date of Birth: [birth year]

## 2019-05-24 ENCOUNTER — Encounter: Payer: Medicare Other | Admitting: Thoracic Surgery (Cardiothoracic Vascular Surgery)

## 2019-05-24 ENCOUNTER — Other Ambulatory Visit: Payer: Self-pay

## 2019-05-24 NOTE — Progress Notes (Signed)
This encounter was created in error - please disregard.

## 2019-05-25 ENCOUNTER — Encounter: Payer: Self-pay | Admitting: Thoracic Surgery (Cardiothoracic Vascular Surgery)

## 2019-05-25 ENCOUNTER — Institutional Professional Consult (permissible substitution) (INDEPENDENT_AMBULATORY_CARE_PROVIDER_SITE_OTHER): Payer: Medicare Other | Admitting: Thoracic Surgery (Cardiothoracic Vascular Surgery)

## 2019-05-25 VITALS — BP 119/73 | HR 66 | Temp 97.5°F | Resp 18 | Ht 64.0 in | Wt 110.0 lb

## 2019-05-25 DIAGNOSIS — I351 Nonrheumatic aortic (valve) insufficiency: Secondary | ICD-10-CM

## 2019-05-25 DIAGNOSIS — I2510 Atherosclerotic heart disease of native coronary artery without angina pectoris: Secondary | ICD-10-CM

## 2019-05-25 DIAGNOSIS — I350 Nonrheumatic aortic (valve) stenosis: Secondary | ICD-10-CM | POA: Diagnosis not present

## 2019-05-25 NOTE — Progress Notes (Signed)
[facility]  MULTIDISCIPLINARY HEART VALVE CLINIC  CARDIOTHORACIC SURGERY CONSULTATION REPORT  Referring Provider is Patwardhan, Anabel Bene, MD PCP is [name], MD  Chief Complaint  Patient presents with   Aortic Stenosis    TAVR consultation    HPI:  Patient is an 85 year old female with history of coronary artery disease, aortic stenosis, hypertension, and hyperlipidemia referred for surgical consultation to discuss treatment options for management of severe paradoxical low flow, low gradient with preserved left ventricular ejection fraction symptomatic aortic stenosis.  Patient has remained relatively active physically and functionally independent throughout her adult life.  Several years ago she began to develop decreased exercise tolerance with exertional shortness of breath and increasing fatigue.  She was evaluated by Dr. Rosemary Holms and found to have moderate aortic stenosis and severe multivessel coronary artery disease.  She underwent multivessel PCI in staged procedures including a total of 6 drug-eluting stents (1 in RCA, 2 in LAD, 3 in LCx).  She states that she thinks she may have enjoyed some improvement after this, but over the past 2 years she has continued to experience decreased energy, loss of appetite, and exertional shortness of breath.  Follow-up transthoracic echocardiogram revealed findings consistent with severe paroxysmal low flow, low gradient with preserved left ventricular ejection fraction aortic stenosis.  Left ventricular ejection fraction was estimated 66%.  Peak velocity across aortic valve measured greater than 3.4 m/s corresponding to mean transvalvular gradient estimated 28 mmHg.  The DVI was reported 0.20 with aortic valve area calculated 0.73 cm.  She underwent diagnostic cardiac catheterization on May 16, 2019.  Mean transvalvular gradient was measured 39 mmHg at the time of catheterization, corresponding to aortic valve area calculated  0.6 cm.  Coronary angiography revealed continued patency of all previously placed stents with no significant obstructive disease.  She was referred to the multidisciplinary heart valve clinic and has been evaluated previously by Dr. Excell Seltzer.  CT angiography was performed and the patient was referred for surgical consultation.  Patient has been widowed for more than 30 years and lives with her daughter in [place].  She has remained functionally independent throughout retirement.  Up until several months ago she still drove an automobile.  She complains that she has been slowing down for the last few years.  She gets short of breath with moderate activity.  She tires easily.  She denies any chest pain or chest tightness either with activity or at rest.  She has not had dizzy spells or syncope.  She denies any history of resting shortness of breath, PND, orthopnea, or lower extremity edema.  Her mobility and activity is limited only by dyspnea and fatigue.  She does not have significant arthritis.  Past Medical History:  Diagnosis Date   Complication of anesthesia    Coronary artery disease    Family hx of colon cancer    High cholesterol    Hypertension    Migraine    "none in years" (09/21/2017)    Past Surgical History:  Procedure Laterality Date   CATARACT EXTRACTION W/ INTRAOCULAR LENS IMPLANT Right 06/04/2011   [facility]   CATARACT EXTRACTION W/PHACO  06/22/2011   Procedure: CATARACT EXTRACTION PHACO AND INTRAOCULAR LENS PLACEMENT (IOC);  Surgeon: Gemma Payor;  Location: [facility];  Service: Ophthalmology;  Laterality: Left;  CDE 14.34   COLONOSCOPY  06/23/08   COLONOSCOPY N/A 01/18/2014   Procedure: COLONOSCOPY;  Surgeon: Malissa Hippo, MD;  Location: [facility];  Service: Endoscopy;  Laterality: N/A;  145   CORONARY ANGIOPLASTY WITH STENT PLACEMENT     CORONARY ATHERECTOMY N/A 09/21/2017   Procedure: CORONARY ATHERECTOMY;  Surgeon: Elder Negus, MD;  Location: [facility]  INVASIVE CV LAB;  Service: Cardiovascular;  Laterality: N/A;   CORONARY STENT INTERVENTION N/A 08/31/2017   Procedure: CORONARY STENT INTERVENTION;  Surgeon: Elder Negus, MD;  Location: [facility] INVASIVE CV LAB;  Service: Cardiovascular;  Laterality: N/A;   CORONARY STENT INTERVENTION N/A 09/21/2017   Procedure: CORONARY STENT INTERVENTION;  Surgeon: Elder Negus, MD;  Location: [facility] INVASIVE CV LAB;  Service: Cardiovascular;  Laterality: N/A;   EYE SURGERY Bilateral    "after cataract OR"   INTRAVASCULAR ULTRASOUND/IVUS N/A 09/21/2017   Procedure: Intravascular Ultrasound/IVUS;  Surgeon: Elder Negus, MD;  Location: [facility] CV [facility];  Service: Cardiovascular;  Laterality: N/A;   RIGHT/LEFT HEART CATH AND CORONARY ANGIOGRAPHY N/A 08/31/2017   Procedure: RIGHT/LEFT HEART CATH AND CORONARY ANGIOGRAPHY;  Surgeon: Elder Negus, MD;  Location: [facility]ASIVE CV [facility];  Service: Cardiovascular;  Laterality: N/A;   RIGHT/LEFT HEART CATH AND CORONARY ANGIOGRAPHY N/A 05/16/2019   Procedure: RIGHT/LEFT HEART CATH AND CORONARY ANGIOGRAPHY;  Surgeon: Elder Negus, MD;  Location: [facility];  Service: Cardiovascular;  Laterality: N/A;   ULTRASOUND GUIDANCE FOR VASCULAR ACCESS  09/21/2017   Procedure: Ultrasound Guidance For Vascular Access;  Surgeon: Elder Negus, MD;  Location: [facility] INVASIVE CV [facility];  Service: Cardiovascular;;   VARICOSE VEIN SURGERY Bilateral 1960s   [facility]    History reviewed. No pertinent family history.  Social History   Socioeconomic History   Marital status: Widowed    Spouse name: Not on file   Number of children: 5   Years of education: Not on file   Highest education level: Not on file  Occupational History   Not on file  Social Needs   Financial resource strain: Not on file   Food insecurity    Worry: Not on file    Inability: Not on file   Transportation needs    Medical: Not on file    Non-medical: Not on  file  Tobacco Use   Smoking status: Never Smoker   Smokeless tobacco: Never Used  Substance and Sexual Activity   Alcohol use: No   Drug use: No   Sexual activity: Not on file  Lifestyle   Physical activity    Days per week: Not on file    Minutes per session: Not on file   Stress: Not on file  Relationships   Social connections    Talks on phone: Not on file    Gets together: Not on file    Attends religious service: Not on file    Active member of club or organization: Not on file    Attends meetings of clubs or organizations: Not on file    Relationship status: Not on file   Intimate partner violence    Fear of current or ex partner: Not on file    Emotionally abused: Not on file    Physically abused: Not on file    Forced sexual activity: Not on file  Other Topics Concern   Not on file  Social History Narrative   Not on file    Current Outpatient Medications  Medication Sig Dispense Refill   amLODipine (NORVASC) 10 MG tablet Take 10 mg by mouth daily.      atenolol (TENORMIN) 100 MG tablet Take 100 mg by mouth daily.  diphenhydrAMINE (BENADRYL) 25 MG tablet Take 25 mg by mouth daily as needed for allergies.     ELIQUIS 2.5 MG TABS tablet TAKE (1) TABLET BY MOUTH TWICE DAILY. (Patient taking differently: Take 2.5 mg by mouth 2 (two) times daily. ) 180 tablet 3   lisinopril (PRINIVIL,ZESTRIL) 40 MG tablet Take 40 mg by mouth daily.      spironolactone (ALDACTONE) 25 MG tablet Take 25 mg by mouth daily.      No current facility-administered medications for this visit.     Allergies  Allergen Reactions   Codeine Nausea And Vomiting      Review of Systems:   General:  poor appetite, decreased energy, no weight gain, some weight loss, no fever  Cardiac:  no chest pain with exertion, no chest pain at rest, +SOB with exertion, no resting SOB, no PND, no orthopnea, no palpitations, no arrhythmia, no atrial fibrillation, no LE edema, no dizzy  spells, no syncope  Respiratory:  no shortness of breath, no home oxygen, no productive cough, no dry cough, no bronchitis, no wheezing, no hemoptysis, no asthma, no pain with inspiration or cough, no sleep apnea, no CPAP at night  GI:   no difficulty swallowing, no reflux, no frequent heartburn, no hiatal hernia, no abdominal pain, no constipation, no diarrhea, no hematochezia, no hematemesis, no melena  GU:   no dysuria,  no frequency, no urinary tract infection, no hematuria, no kidney stones, no kidney disease  Vascular:  no pain suggestive of claudication, no pain in feet, no leg cramps, no varicose veins, no DVT, no non-healing foot ulcer  Neuro:   no stroke, no TIA's, no seizures, no headaches, no temporary blindness one eye,  no slurred speech, no peripheral neuropathy, no chronic pain, no instability of gait, no memory/cognitive dysfunction  Musculoskeletal: no arthritis, no joint swelling, no myalgias, no difficulty walking, normal mobility   Skin:   no rash, no itching, no skin infections, no pressure sores or ulcerations  Psych:   no anxiety, no depression, no nervousness, no unusual recent stress  Eyes:   no blurry vision, no floaters, no recent vision changes, + wears glasses or contacts  ENT:   no hearing loss, no loose or painful teeth, no dentures, last saw dentist 2 years ago  Hematologic:  no easy bruising, no abnormal bleeding, no clotting disorder, no frequent epistaxis  Endocrine:  no diabetes, does not check CBG's at home           Physical Exam:   BP 119/73 (BP Location: Right Arm, Patient Position: Sitting, Cuff Size: Normal)    Pulse 66    Temp (!) 97.5 F (36.4 C)    Resp 18    Ht 5\' 4"  (1.626 m)    Wt 110 lb (49.9 kg)    SpO2 95% Comment: RA   BMI 18.88 kg/m   General:  Elderly and somewhat frail-appearing  HEENT:  Unremarkable   Neck:   no JVD, no bruits, no adenopathy   Chest:   clear to auscultation, symmetrical breath sounds, no wheezes, no rhonchi    CV:   RRR, grade III/VI crescendo/decrescendo murmur heard best at RSB,  no diastolic murmur  Abdomen:  soft, non-tender, no masses   Extremities:  warm, well-perfused, pulses diminished but palpable, no LE edema  Rectal/GU  Deferred  Neuro:   Grossly non-focal and symmetrical throughout  Skin:   Clean and dry, no rashes, no breakdown   Diagnostic Tests:  TRANSTHORACIC ECHOCARDIOGRAM  Both report and images from transthoracic echocardiogram performed at [facility] on May 01, 2019 are reviewed.  The aortic valve is trileaflet with moderate to severe thickening, calcification, and restricted leaflet mobility involving all 3 leaflets of the aortic valve.  Peak velocity across the aortic valve measured close to 3.5 m/s corresponding to mean transvalvular gradient estimated 28 mmHg.  The size of the aortic valve and aortic root is relatively small.  Aortic valve area was calculated 0.73 cm.  There is normal left ventricular function.  The DVI was reported 0.20      RIGHT/LEFT HEART CATH AND CORONARY ANGIOGRAPHY  Conclusion  LM: Normal LAD: Patent prox-mid LAD stent. No ISR. No other significant disease. LCx: Patent prox-mid LCx stent. No ISR. No other significant disease. RCA: Patent prox RCA stent. Minimal 10% late lumen loss. No significant ISR. No other significant disease No obstructive CAD  Severe aortic valve stenosis (Simultaneous LV-Ao hemodynamic assessment) Mean PG 39 mmHg, AVA 0.6 cm2, AVAi 0.4 cm2/m2  Recommendation: TAVR workup  Elder NegusManish J Patwardhan, MD [facility] Pager: [phone] Office: [phone] If no answer Cell [phone]     Recommendations  Antiplatelet/Anticoag Anticoagulation with Eliquis.  Procedural Details  Technical Details Procedures: 1. Right heart catheterization 2. Left heart catheterization 3. Selective left and right coronary angiography 4. Conscious sedation monitoring 49  min  Indication: Severe AS  History: 85 year old Caucasian female with hypertension, CAD s/p successful diamondback orbital atherectomy and multivessel PCI to LAD, left circumflex, proximal to mid RCA (08/2017), paroxysmal Afib, now with severe aortic stenosis. She is here for pre TAVR workup.   Diagnostic Angiography: Catheter/s advances over guidewire under fluoroscopy Left coronary artery: 5 Fr AL-1  Right coronary artery: 5 Fr JR 4 Left heart catheterization: 5 Fr Langston catheter (Aortic valve crossed using AL-1 catheter without difficulty and exchanged for [facility]ter)  Pressures tracings obtained in right atrium, right ventricle, pulmonary artery, and pulmonary capillary wedge position.   Anticoagulation:  3000 units heparin  Hemostasis: TR band  Total contrast used: 35 cc   Total fluoro time: 11.9 min Air Kerma: 125 mGy  All wires and catheters removed out of the body at the end of the procedure Final angiogram showed no dissection/perforation         Estimated blood loss <50 mL.   During this procedure medications were administered to achieve and maintain moderate conscious sedation while the patient's heart rate, blood pressure, and oxygen saturation were continuously monitored and I was present face-to-face 100% of this time.  Medications (Filter: Administrations occurring from 05/16/19 1247 to 05/16/19 1403) Medication Rate/Dose/Volume Action  Date Time   Heparin (Porcine) in NaCl 1000-0.9 UT/500ML-% SOLN (mL) 500 mL Given 05/16/19 1252   Total dose as of 05/16/19 1403 500 mL Given 1252   1,000 mL        midazolam (VERSED) injection (mg) 0.5 mg Given 05/16/19 1304   Total dose as of 05/16/19 1403        0.5 mg        fentaNYL (SUBLIMAZE) injection (mcg) 12.5 mcg Given 05/16/19 1304   Total dose as of 05/16/19 1403        12.5 mcg        lidocaine (PF) (XYLOCAINE) 1 % injection (mL) 2 mL Given 05/16/19 1307   Total dose as of 05/16/19 1403 2  mL Given 1309   4 mL        Radial Cocktail/Verapamil only (mL) 10 mL Given 05/16/19 1310  Total dose as of 05/16/19 1403        10 mL        heparin injection (Units) 3,000 Units Given 05/16/19 1332   Total dose as of 05/16/19 1403        3,000 Units        iohexol (OMNIPAQUE) 350 MG/ML injection (mL) 35 mL Given 05/16/19 1358   Total dose as of 05/16/19 1403        35 mL        Sedation Time  Sedation Time Physician-1: 48 minutes 47 seconds  Complications  Complications documented before study signed (05/16/2019 2:10 PM)   RIGHT/LEFT HEART CATH AND CORONARY ANGIOGRAPHY  None Documented by Elder NegusPatwardhan, Manish J, MD 05/16/2019 2:06 PM  Date Found: 05/16/2019  Time Range: Intraprocedure      Coronary Findings  Diagnostic Dominance: Right Left Anterior Descending  Prox LAD to Mid LAD lesion 0% stenosed  Previously placed Prox LAD to Mid LAD drug eluting stent is widely patent. The lesion is calcified.  Left Circumflex  Prox Cx to Mid Cx lesion 0% stenosed  Previously placed Prox Cx to Mid Cx drug eluting stent is widely patent. The lesion is calcified.  Right Coronary Artery  Prox RCA lesion 10% stenosed  Prox RCA lesion is 10% stenosed. The lesion was previously treated. 10% late lumen loss. No significant ISR.  Intervention  No interventions have been documented. Right Heart  Right Heart Pressures RA: 1 mmHg RV: 20/0 mmHg PA: 21/2 mmHg. Mean PA 10 mmHg PCWP: 4 mmHg  Left Heart  Left Ventricle LV end diastolic pressure is normal. LV 156/0 mmHg, LVEDP 13 mmHg Ao 127/49 mmHg.  AoV mean PG 39.6 mmHg AVA 0.64 cm2 AVAi 0.42 cm2/m2  Coronary Diagrams  Diagnostic Dominance: Right  Intervention  Implants   No implant documentation for this case.  Syngo Images  Show images for CARDIAC CATHETERIZATION  Images on Long Term Storage  Show images for Montez MoritaCarter, Dariella M   Link to Procedure Log  Procedure Log    Hemo Data   Most Recent Value  Fick Cardiac  Output 3.77 L/min  Fick Cardiac Output Index 2.48 (L/min)/BSA  Aortic Mean Gradient 39.55 mmHg  Aortic Peak Gradient 29 mmHg  Aortic Valve Area 0.64  Aortic Value Area Index 0.42 cm2/BSA  RA A Wave 1 mmHg  RA V Wave 0 mmHg  RA Mean 0 mmHg  RV Systolic Pressure 20 mmHg  RV Diastolic Pressure -2 mmHg  RV EDP 1 mmHg  PA Systolic Pressure 21 mmHg  PA Diastolic Pressure 2 mmHg  PA Mean 10 mmHg  PW A Wave 8 mmHg  PW V Wave 5 mmHg  PW Mean 4 mmHg  AO Systolic Pressure 127 mmHg  AO Diastolic Pressure 49 mmHg  AO Mean 80 mmHg  LV Systolic Pressure 160 mmHg  LV Diastolic Pressure 0 mmHg  LV EDP 10 mmHg  AOp Systolic Pressure 129 mmHg  AOp Diastolic Pressure 48 mmHg  AOp Mean Pressure 79 mmHg  LVp Systolic Pressure 156 mmHg  LVp Diastolic Pressure 0 mmHg  LVp EDP Pressure 13 mmHg  QP/QS 1  TPVR Index 4.03 HRUI  TSVR Index 30.21 HRUI  TPVR/TSVR Ratio 0.13    Cardiac TAVR CT  TECHNIQUE: The patient was scanned on a [employer] scanner. A 120 kV retrospective scan was triggered in the descending thoracic aorta at 111 HU's. Gantry rotation speed was 250 msecs and collimation was .6 mm. No beta blockade or  nitro were given. The 3D data set was reconstructed in 5% intervals of the R-R cycle. Systolic and diastolic phases were analyzed on a dedicated work station using MPR, MIP and VRT modes. The patient received 80 cc of contrast.  FINDINGS: Aortic Valve: Trileaflet aortic valve with severely thickened and moderately calcified leaflets with no calcifications extending into the LVOT.  Aorta: Normal size with moderate diffuse atherosclerotic plaque and calcifications.  Sinotubular Junction: 25.9 x 25.3 mm  Ascending Thoracic Aorta: 33.8 x 32.8 mm  Aortic Arch: 24.5 x 22.7 mm  Descending Thoracic Aorta: 20.0 x 19.2 mm  Sinus of Valsalva Measurements:  Non-coronary: 32 mm  Right -coronary: 29 mm  Left -coronary: 31 mm  Coronary Artery Height above  Annulus:  Left Main: 12 mm  Right Coronary: 14 mm  Virtual Basal Annulus Measurements:  Maximum/Minimum Diameter: 25.0 x 20.9 mm  Mean Diameter: 22.6 mm  Perimeter: 72 mm  Area: 401 mm2  Optimum Fluoroscopic Angle for Delivery: LAO 7 CAU 7  IMPRESSION: 1. Trileaflet aortic valve with severely thickened and moderately calcified leaflets with no calcifications extending into the LVOT. Annular measurements suitable for delivery of a 23 mm Edwards-SAPIEN 3 valve. Aortic valve calcium score 1930 consistent with severe aortic stenosis.  2. Sufficient coronary to annulus distance.  3. Optimum Fluoroscopic Angle for Delivery: LAO 7 CAU 7.  4. No thrombus in the left atrial appendage.   Electronically Signed   By: Ena Dawley   On: 05/22/2019 22:06    CT ANGIOGRAPHY CHEST, ABDOMEN AND PELVIS  TECHNIQUE: Multidetector CT imaging through the chest, abdomen and pelvis was performed using the standard protocol during bolus administration of intravenous contrast. Multiplanar reconstructed images and MIPs were obtained and reviewed to evaluate the vascular anatomy.  CONTRAST:  179mL OMNIPAQUE IOHEXOL 350 MG/ML SOLN  COMPARISON:  No priors.  FINDINGS: CTA CHEST FINDINGS  Cardiovascular: Heart size is borderline enlarged. There is no significant pericardial fluid, thickening or pericardial calcification. There is aortic atherosclerosis, as well as atherosclerosis of the great vessels of the mediastinum and the coronary arteries, including calcified atherosclerotic plaque in the left main, left anterior descending, left circumflex and right coronary arteries. Severe thickening calcification of the aortic valve. Moderate calcifications of the mitral annulus.  Mediastinum/Lymph Nodes: No pathologically enlarged mediastinal or hilar lymph nodes. Small hiatal hernia. No axillary lymphadenopathy.  Lungs/Pleura: No suspicious appearing pulmonary  nodules or masses. No acute consolidative airspace disease. No pleural effusions.  Musculoskeletal/Soft Tissues: There are no aggressive appearing lytic or blastic lesions noted in the visualized portions of the skeleton.  CTA ABDOMEN AND PELVIS FINDINGS  Hepatobiliary: No suspicious cystic or solid hepatic lesions. No intra or extrahepatic biliary ductal dilatation. Gallbladder is normal in appearance.  Pancreas: No pancreatic mass. No pancreatic ductal dilatation. No pancreatic or peripancreatic fluid or inflammatory changes.  Spleen: Unremarkable.  Adrenals/Urinary Tract: Multiple low-attenuation lesions in both kidneys, compatible with simple cysts, largest of which measure up to 2.6 cm in the posterior aspect of the upper pole the left kidney. Multifocal cortical thinning in both kidneys, presumably post infectious or inflammatory scarring. Bilateral adrenal glands are normal in appearance. There is no hydroureteronephrosis. Urinary bladder is normal in appearance.  Stomach/Bowel: Normal appearance of the stomach. No pathologic dilatation of small bowel or colon. A few scattered colonic diverticulae are noted, without surrounding inflammatory changes to suggest an acute diverticulitis at this time. Normal appendix.  Vascular/Lymphatic: Aortic atherosclerosis, without evidence of aneurysm or dissection in the abdominal  or pelvic vasculature. Vascular findings and measurements pertinent to potential TAVR procedure, as detailed below. No lymphadenopathy noted in the abdomen or pelvis.  Reproductive: Uterus and ovaries are unremarkable in appearance.  Other: No significant volume of ascites.  No pneumoperitoneum.  Musculoskeletal: Chronic appearing compression fractures of L1, L2 and L3, most severe at L3 where there is up to 90% loss of central vertebral body height. Old healed fractures of the right superior and inferior pubic rami. There are no aggressive  appearing lytic or blastic lesions noted in the visualized portions of the skeleton.  VASCULAR MEASUREMENTS PERTINENT TO TAVR:  AORTA:  Minimal Aortic Diameter-11 x 12 mm  Severity of Aortic Calcification-severe  RIGHT PELVIS:  Right Common Iliac Artery -  Minimal Diameter-7.3 x 6.9 mm  Tortuosity-mild  Calcification-mild  Right External Iliac Artery -  Minimal Diameter-5.8 x 6.1 mm  Tortuosity-severe  Calcification-none  Right Common Femoral Artery -  Minimal Diameter-5.8 x 4.5 mm  Tortuosity-mild  Calcification-mild  LEFT PELVIS:  Left Common Iliac Artery -  Minimal Diameter-8.0 x 8.5 mm  Tortuosity-mild  Calcification-none  Left External Iliac Artery -  Minimal Diameter-6.1 x 6.2 mm  Tortuosity-severe  Calcification-none  Left Common Femoral Artery -  Minimal Diameter-5.5 x 5.5 mm  Tortuosity-mild  Calcification-moderate  Review of the MIP images confirms the above findings.  IMPRESSION: 1. Vascular findings and measurements pertinent to potential TAVR procedure, as detailed above. 2. Severe thickening and calcification of the aortic valve, compatible with the reported clinical history of severe aortic stenosis. 3. Aortic atherosclerosis, in addition to left main and 3 vessel coronary artery disease. 4. Colonic diverticulosis without evidence of acute diverticulitis at this time. 5. Additional incidental findings, as above.   Electronically Signed   By: Trudie Reed M.D.   On: 05/22/2019 13:51   EKG: NSR w/out significant AV conduction delay   Impression:  Patient has stage D severe symptomatic paradoxical low flow, low gradient with preserved ejection fraction aortic stenosis.  She presents with progressive symptoms of exertional shortness of breath and fatigue consistent with chronic diastolic congestive heart failure, [place] Heart Association functional class II.  I personally  reviewed the patient's recent transthoracic echocardiogram, diagnostic cardiac catheterization, and CT angiogram.  Echocardiogram reveals classical features of paradoxical low flow, low gradient with preserved ejection fraction aortic stenosis.  The aortic valve is trileaflet with severe thickening, calcification, and restricted leaflet mobility involving all 3 leaflets.  Peak velocity across aortic valve measured 3.5 m/s corresponding to mean transvalvular gradient estimated 28 mmHg.  Aortic valve area is calculated 0.73 cm and the DVI notably only 0.20 with ejection fraction calculated 66%.  The overall size of the aortic valve and aortic root is relatively small.  Diagnostic cardiac catheterization confirmed the presence of severe aortic stenosis with mean transvalvular gradient measured 39 mmHg at catheterization and aortic valve area calculated only 0.6 cm.  There was nonobstructive coronary artery disease with continued patency of all previously placed stents in all 3 major vascular distributions.  I agree the patient would likely benefit from aortic valve replacement.  Aortic root enlargement or root replacement would be required to avoid the development of patient prosthesis mismatch.  I would be reluctant to consider this elderly patient a candidate for conventional surgery.  Cardiac-gated CTA of the heart reveals anatomical characteristics consistent with aortic stenosis suitable for treatment by transcatheter aortic valve replacement without any significant complicating features and CTA of the aorta and iliac vessels demonstrate what appears  to be adequate pelvic vascular access to facilitate a transfemoral approach.  Baseline EKG reveals sinus rhythm with no significant AV conduction delay.   Plan:  The patient and her daughter were counseled at length regarding treatment alternatives for management of severe symptomatic aortic stenosis. Alternative approaches such as conventional aortic valve  replacement, transcatheter aortic valve replacement, and continued medical therapy without intervention were compared and contrasted at length.  The risks associated with conventional surgical aortic valve replacement were discussed in detail, as were expectations for post-operative convalescence, and why I would be reluctant to consider this patient a candidate for conventional surgery.  Issues specific to transcatheter aortic valve replacement were discussed including questions about long term valve durability, the potential for paravalvular leak, possible increased risk of need for permanent pacemaker placement, and other technical complications related to the procedure itself.  Long-term prognosis with medical therapy was discussed. This discussion was placed in the context of the patient's own specific clinical presentation and past medical history.  All of their questions have been addressed.  The patient hopes to proceed with transcatheter aortic valve replacement as soon as possible.  She will tentatively be posted for surgery on June 06, 2019 with Dr. Excell Seltzer and Dr. Laneta Simmers.  Following the decision to proceed with transcatheter aortic valve replacement, a discussion has been held regarding what types of management strategies would be attempted intraoperatively in the event of life-threatening complications, including whether or not the patient would be considered a candidate for the use of cardiopulmonary bypass and/or conversion to open sternotomy for attempted surgical intervention.  The patient has been advised of a variety of complications that might develop including but not limited to risks of death, stroke, paravalvular leak, aortic dissection or other major vascular complications, aortic annulus rupture, device embolization, cardiac rupture or perforation, mitral regurgitation, acute myocardial infarction, arrhythmia, heart block or bradycardia requiring permanent pacemaker placement, congestive  heart failure, respiratory failure, renal failure, pneumonia, infection, other late complications related to structural valve deterioration or migration, or other complications that might ultimately cause a temporary or permanent loss of functional independence or other long term morbidity.  The patient provides full informed consent for the procedure as described and all questions were answered.    I spent in excess of 90 minutes during the conduct of this office consultation and >50% of this time involved direct face-to-face encounter with the patient for counseling and/or coordination of their care.     Salvatore Decent. Cornelius Moras, MD 05/25/2019 3:34 PM

## 2019-05-25 NOTE — Patient Instructions (Signed)
Stop taking Eliquis 3 days prior to your procedure  Continue taking all other medications without change through the day before surgery.  Have nothing to eat or drink after midnight the night before surgery.  On the morning of surgery do not take any medications

## 2019-05-26 ENCOUNTER — Other Ambulatory Visit: Payer: Self-pay

## 2019-05-26 DIAGNOSIS — I350 Nonrheumatic aortic (valve) stenosis: Secondary | ICD-10-CM

## 2019-05-30 NOTE — Pre-Procedure Instructions (Signed)
Catherine Anthony  05/30/2019       Your procedure is scheduled on Tuesday, July 7.  Report to [facility], Main Entrance or Entrance "A" at 10:00 A.M.               Your surgery or procedure is scheduled for 12:15 AM   Call this number if you have problems the morning of surgery: [phone]  This is the number for the Pre- Surgical Desk.   Remember:  Do not eat or drink after midnight, Monday, July 6   Take these medicines the morning of surgery with A SIP OF WATER : none  Stop Eliquis as instructed by surgeon - 06/03/2019  STOP taking Aspirin, Aspirin Products (Goody Powder, Excedrin Migraine), Ibuprofen (Advil), Naproxen (Aleve), Vitamins and Herbal Products (ie Fish Oil).   Special instructions:   [facility]- Preparing For Surgery  Before surgery, you can play an important role. Because skin is not sterile, your skin needs to be as free of germs as possible. You can reduce the number of germs on your skin by washing with CHG (chlorahexidine gluconate) Soap before surgery.  CHG is an antiseptic cleaner which kills germs and bonds with the skin to continue killing germs even after washing.    Oral Hygiene is also important to reduce your risk of infection.  Remember - BRUSH YOUR TEETH THE MORNING OF SURGERY WITH YOUR REGULAR TOOTHPASTE  Please do not use if you have an allergy to CHG or antibacterial soaps. If your skin becomes reddened/irritated stop using the CHG.  Do not shave (including legs and underarms) for at least 48 hours prior to first CHG shower. It is OK to shave your face.  Please follow these instructions carefully.   1. Shower the NIGHT BEFORE SURGERY and the MORNING OF SURGERY with CHG.   2. If you chose to wash your hair, wash your hair first as usual with your normal shampoo. 3. After you shampoo, wash your face and private area with the soap you use at home, then rinse your hair and body thoroughly to remove the shampoo and soap.  4. Use CHG as  you would any other liquid soap. You can apply CHG directly to the skin and wash gently with a scrungie or a clean washcloth.   5. Apply the CHG Soap to your body ONLY FROM THE NECK DOWN.  Do not use on open wounds or open sores. Avoid contact with your eyes, ears, mouth and genitals (private parts).   6. Wash thoroughly, paying special attention to the area where your surgery will be performed.  7. Thoroughly rinse your body with warm water from the neck down.  8. DO NOT shower/wash with your normal soap after using and rinsing off the CHG Soap.  9. Pat yourself dry with a CLEAN TOWEL.  10. Wear CLEAN PAJAMAS to bed the night before surgery, wear comfortable clothes the morning of surgery  11. Place CLEAN SHEETS on your bed the night of your first shower and DO NOT SLEEP WITH PETS.  Day of Surgery:  Shower as Instructed above Do not wear lotions, powders, or perfumes, or deodorant. Please wear clean clothes to the hospital/surgery center.   Remember to brush your teeth WITH YOUR REGULAR TOOTHPASTE.   Do not wear jewelry, make-up or nail polish.   Do not shave 48 hours prior to surgery.  Men may shave face and neck.  Do not bring valuables to the hospital.  [facility] is not responsible for any belongings or valuables.  Contacts, dentures or bridgework may not be worn into surgery.  Leave your suitcase in the car.  After surgery it may be brought to your room.  For patients admitted to the hospital, discharge time will be determined by your treatment team.  Patients discharged the day of surgery will not be allowed to drive home.   Please read over the following fact sheets that you were given: Pain Booklet, Patient Instructions for Mupirocin Application, Coughing and Deep Breathing,Surgical Site Infections.

## 2019-06-01 ENCOUNTER — Other Ambulatory Visit: Payer: Self-pay

## 2019-06-01 ENCOUNTER — Ambulatory Visit (HOSPITAL_COMMUNITY)
Admission: RE | Admit: 2019-06-01 | Discharge: 2019-06-01 | Disposition: A | Discharge status: Home / Self Care | Payer: Medicare Other | Source: Ambulatory Visit | Attending: Cardiovascular Disease | Admitting: Cardiovascular Disease

## 2019-06-01 ENCOUNTER — Other Ambulatory Visit (HOSPITAL_COMMUNITY)
Admission: RE | Admit: 2019-06-01 | Discharge: 2019-06-01 | Disposition: A | Discharge status: Home / Self Care | Payer: Medicare Other | Source: Ambulatory Visit | Attending: Cardiovascular Disease | Admitting: Cardiovascular Disease

## 2019-06-01 ENCOUNTER — Encounter (HOSPITAL_COMMUNITY): Payer: Self-pay

## 2019-06-01 ENCOUNTER — Encounter (HOSPITAL_COMMUNITY)
Admission: RE | Admit: 2019-06-01 | Discharge: 2019-06-01 | Disposition: A | Discharge status: Home / Self Care | Payer: Medicare Other | Source: Ambulatory Visit | Attending: Cardiovascular Disease | Admitting: Cardiovascular Disease

## 2019-06-01 DIAGNOSIS — I2510 Atherosclerotic heart disease of native coronary artery without angina pectoris: Secondary | ICD-10-CM | POA: Diagnosis not present

## 2019-06-01 DIAGNOSIS — Z1159 Encounter for screening for other viral diseases: Secondary | ICD-10-CM | POA: Diagnosis not present

## 2019-06-01 DIAGNOSIS — I350 Nonrheumatic aortic (valve) stenosis: Secondary | ICD-10-CM | POA: Insufficient documentation

## 2019-06-01 DIAGNOSIS — I10 Essential (primary) hypertension: Secondary | ICD-10-CM | POA: Diagnosis not present

## 2019-06-01 DIAGNOSIS — R9431 Abnormal electrocardiogram [ECG] [EKG]: Secondary | ICD-10-CM | POA: Insufficient documentation

## 2019-06-01 DIAGNOSIS — I491 Atrial premature depolarization: Secondary | ICD-10-CM | POA: Insufficient documentation

## 2019-06-01 DIAGNOSIS — Z01818 Encounter for other preprocedural examination: Secondary | ICD-10-CM | POA: Insufficient documentation

## 2019-06-01 HISTORY — DX: Personal history of urinary calculi: Z87.442

## 2019-06-01 HISTORY — DX: Unspecified osteoarthritis, unspecified site: M19.90

## 2019-06-01 LAB — TYPE AND SCREEN
ABO/RH(D): O POS
Antibody Screen: NEGATIVE

## 2019-06-01 LAB — BLOOD GAS, ARTERIAL
Acid-base deficit: 5.7 mmol/L — ABNORMAL HIGH (ref 0.0–2.0)
Bicarbonate: 18 mmol/L — ABNORMAL LOW (ref 20.0–28.0)
Drawn by: 265211
FIO2: 21
O2 Saturation: 98.1 %
Patient temperature: 98.6
pCO2 arterial: 28.5 mmHg — ABNORMAL LOW (ref 32.0–48.0)
pH, Arterial: 7.415 (ref 7.350–7.450)
pO2, Arterial: 108 mmHg (ref 83.0–108.0)

## 2019-06-01 LAB — COMPREHENSIVE METABOLIC PANEL
ALT: 11 U/L (ref 0–44)
AST: 16 U/L (ref 15–41)
Albumin: 4 g/dL (ref 3.5–5.0)
Alkaline Phosphatase: 50 U/L (ref 38–126)
Anion gap: 13 (ref 5–15)
BUN: 26 mg/dL — ABNORMAL HIGH (ref 8–23)
CO2: 16 mmol/L — ABNORMAL LOW (ref 22–32)
Calcium: 10.1 mg/dL (ref 8.9–10.3)
Chloride: 104 mmol/L (ref 98–111)
Creatinine, Ser: 1.23 mg/dL — ABNORMAL HIGH (ref 0.44–1.00)
GFR calc Af Amer: 46 mL/min — ABNORMAL LOW (ref >60)
GFR calc non Af Amer: 40 mL/min — ABNORMAL LOW (ref >60)
Glucose, Bld: 117 mg/dL — ABNORMAL HIGH (ref 70–99)
Potassium: 4.2 mmol/L (ref 3.5–5.1)
Sodium: 133 mmol/L — ABNORMAL LOW (ref 135–145)
Total Bilirubin: 1 mg/dL (ref 0.3–1.2)
Total Protein: 7.2 g/dL (ref 6.5–8.1)

## 2019-06-01 LAB — URINALYSIS, ROUTINE W REFLEX MICROSCOPIC
Bilirubin Urine: NEGATIVE
Glucose, UA: NEGATIVE mg/dL
Ketones, ur: NEGATIVE mg/dL
Nitrite: NEGATIVE
Protein, ur: NEGATIVE mg/dL
Specific Gravity, Urine: 1.011 (ref 1.005–1.030)
pH: 5 (ref 5.0–8.0)

## 2019-06-01 LAB — CBC
HCT: 42.2 % (ref 36.0–46.0)
Hemoglobin: 14.2 g/dL (ref 12.0–15.0)
MCH: 33.3 pg (ref 26.0–34.0)
MCHC: 33.6 g/dL (ref 30.0–36.0)
MCV: 98.8 fL (ref 80.0–100.0)
Platelets: 322 10*3/uL (ref 150–400)
RBC: 4.27 MIL/uL (ref 3.87–5.11)
RDW: 12.3 % (ref 11.5–15.5)
WBC: 9.2 10*3/uL (ref 4.0–10.5)
nRBC: 0 % (ref 0.0–0.2)

## 2019-06-01 LAB — PROTIME-INR
INR: 1.2 (ref 0.8–1.2)
Prothrombin Time: 14.8 seconds (ref 11.4–15.2)

## 2019-06-01 LAB — SURGICAL PCR SCREEN
MRSA, PCR: NEGATIVE
Staphylococcus aureus: NEGATIVE

## 2019-06-01 LAB — SARS CORONAVIRUS 2 (TAT 6-24 HRS): SARS Coronavirus 2: NEGATIVE

## 2019-06-01 LAB — HEMOGLOBIN A1C
Hgb A1c MFr Bld: 5 % (ref 4.8–5.6)
Mean Plasma Glucose: 96.8 mg/dL

## 2019-06-01 LAB — BRAIN NATRIURETIC PEPTIDE: B Natriuretic Peptide: 153.6 pg/mL — ABNORMAL HIGH (ref 0.0–100.0)

## 2019-06-01 LAB — ABO/RH: ABO/RH(D): O POS

## 2019-06-01 LAB — APTT: aPTT: 40 seconds — ABNORMAL HIGH (ref 24–36)

## 2019-06-01 NOTE — Progress Notes (Addendum)
PCP - Dr. Shelia Media  Cardiologist - Dr. Robb Matar  Chest x-ray - 06/01/2019  EKG - 06/01/2019  Stress Test -   ECHO -   Cardiac Cath - 05/16/2019  Sleep Study - no CPAP - no  LABS-  ASA-no  Eliquis - last dose will be 06/03/2019 pm, patient verbalized this to me  ERAS-no  HA1C- 5.0 Fasting Blood Sugar - na Checks Blood Sugar _0____ times a day  Anesthesia- Mrs. Mackowski reports that she has shortness of breath and has for a few months, her doctors are aware.  Patient reports that she had  Chest pain last week, she was sitting outside not doing anything, she went inside and took 1 NTG and lied down. Mrs. Duck states that pain was mid chest, pain score of 10, she denies dizziness, N/V, increased shortness of breath. Pain lasted about an hour. AN EKG was done in PAT, I called Theodosia Quay, RN  And inform her.  Lauren reviewed EKG and did not see anything acute.  Lauren asked that I tell patient that if she has chest pain that doesn't go away to come to the ER and to not be out in the heat. I informed Mrs Siglin of the information that Ander Purpura told me to tell her and patient verbalized understanding.   Pt denies having chest pain, sob, or fever at this time. All instructions explained to the pt, with a verbal understanding of the material. Pt agrees to go over the instructions while at home for a better understanding. Pt also instructed to self quarantine after being tested for COVID-19. The opportunity to ask questions was provided.

## 2019-06-05 MED ORDER — NOREPINEPHRINE 4 MG/250ML-% IV SOLN
0.0000 ug/min | INTRAVENOUS | Status: AC
  Administered 2019-06-06: 2 ug/min via INTRAVENOUS
  Filled 2019-06-05: qty 250

## 2019-06-05 MED ORDER — DEXMEDETOMIDINE HCL IN NACL 400 MCG/100ML IV SOLN
0.1000 ug/kg/h | INTRAVENOUS | Status: AC
  Administered 2019-06-06: 1 ug/kg/h via INTRAVENOUS
  Filled 2019-06-05 (×2): qty 100

## 2019-06-05 MED ORDER — MAGNESIUM SULFATE 50 % IJ SOLN
40.0000 meq | INTRAMUSCULAR | Status: DC
  Filled 2019-06-05 (×2): qty 9.85

## 2019-06-05 MED ORDER — POTASSIUM CHLORIDE 2 MEQ/ML IV SOLN
80.0000 meq | INTRAVENOUS | Status: DC
  Filled 2019-06-05 (×2): qty 40

## 2019-06-05 MED ORDER — SODIUM CHLORIDE 0.9 % IV SOLN
1.5000 g | INTRAVENOUS | Status: AC
  Administered 2019-06-06: 1.5 g via INTRAVENOUS
  Filled 2019-06-05 (×2): qty 1.5

## 2019-06-05 MED ORDER — VANCOMYCIN HCL 10 G IV SOLR
1250.0000 mg | INTRAVENOUS | Status: AC
  Administered 2019-06-06: 1250 mg via INTRAVENOUS
  Filled 2019-06-05 (×2): qty 1250

## 2019-06-05 MED ORDER — SODIUM CHLORIDE 0.9 % IV SOLN
INTRAVENOUS | Status: DC
  Filled 2019-06-05 (×2): qty 30

## 2019-06-06 ENCOUNTER — Inpatient Hospital Stay (HOSPITAL_COMMUNITY): Payer: Medicare Other

## 2019-06-06 ENCOUNTER — Inpatient Hospital Stay (HOSPITAL_COMMUNITY): Payer: Medicare Other | Admitting: Physician Assistant

## 2019-06-06 ENCOUNTER — Encounter (HOSPITAL_COMMUNITY)
Admission: RE | Disposition: A | Discharge status: Home / Self Care | Payer: Self-pay | Source: Home / Self Care | Attending: Cardiovascular Disease

## 2019-06-06 ENCOUNTER — Other Ambulatory Visit: Payer: Self-pay

## 2019-06-06 ENCOUNTER — Other Ambulatory Visit (HOSPITAL_COMMUNITY): Payer: Self-pay | Admitting: *Deleted

## 2019-06-06 ENCOUNTER — Encounter (HOSPITAL_COMMUNITY): Payer: Self-pay

## 2019-06-06 ENCOUNTER — Inpatient Hospital Stay (HOSPITAL_COMMUNITY)
Admission: RE | Admit: 2019-06-06 | Discharge: 2019-06-07 | DRG: 266 | Disposition: A | Discharge status: Home / Self Care | Payer: Medicare Other | Attending: Cardiovascular Disease | Admitting: Cardiovascular Disease

## 2019-06-06 ENCOUNTER — Inpatient Hospital Stay (HOSPITAL_COMMUNITY): Payer: Medicare Other | Admitting: Certified Registered Nurse Anesthetist

## 2019-06-06 DIAGNOSIS — I5033 Acute on chronic diastolic (congestive) heart failure: Secondary | ICD-10-CM

## 2019-06-06 DIAGNOSIS — I10 Essential (primary) hypertension: Secondary | ICD-10-CM | POA: Diagnosis present

## 2019-06-06 DIAGNOSIS — I480 Paroxysmal atrial fibrillation: Secondary | ICD-10-CM | POA: Diagnosis present

## 2019-06-06 DIAGNOSIS — E785 Hyperlipidemia, unspecified: Secondary | ICD-10-CM | POA: Diagnosis present

## 2019-06-06 DIAGNOSIS — E7800 Pure hypercholesterolemia, unspecified: Secondary | ICD-10-CM | POA: Diagnosis not present

## 2019-06-06 DIAGNOSIS — J9811 Atelectasis: Secondary | ICD-10-CM | POA: Diagnosis not present

## 2019-06-06 DIAGNOSIS — Z952 Presence of prosthetic heart valve: Secondary | ICD-10-CM

## 2019-06-06 DIAGNOSIS — M79609 Pain in unspecified limb: Secondary | ICD-10-CM

## 2019-06-06 DIAGNOSIS — M1990 Unspecified osteoarthritis, unspecified site: Secondary | ICD-10-CM | POA: Diagnosis not present

## 2019-06-06 DIAGNOSIS — Z7901 Long term (current) use of anticoagulants: Secondary | ICD-10-CM

## 2019-06-06 DIAGNOSIS — Z955 Presence of coronary angioplasty implant and graft: Secondary | ICD-10-CM | POA: Diagnosis not present

## 2019-06-06 DIAGNOSIS — I110 Hypertensive heart disease with heart failure: Secondary | ICD-10-CM | POA: Diagnosis present

## 2019-06-06 DIAGNOSIS — N19 Unspecified kidney failure: Secondary | ICD-10-CM | POA: Diagnosis not present

## 2019-06-06 DIAGNOSIS — I350 Nonrheumatic aortic (valve) stenosis: Principal | ICD-10-CM

## 2019-06-06 DIAGNOSIS — Z800 Family history of malignant neoplasm of digestive organs: Secondary | ICD-10-CM

## 2019-06-06 DIAGNOSIS — Z006 Encounter for examination for normal comparison and control in clinical research program: Secondary | ICD-10-CM | POA: Diagnosis not present

## 2019-06-06 DIAGNOSIS — I2510 Atherosclerotic heart disease of native coronary artery without angina pectoris: Secondary | ICD-10-CM | POA: Diagnosis present

## 2019-06-06 DIAGNOSIS — Z79899 Other long term (current) drug therapy: Secondary | ICD-10-CM

## 2019-06-06 HISTORY — DX: Paroxysmal atrial fibrillation: I48.0

## 2019-06-06 HISTORY — PX: TRANSCATHETER AORTIC VALVE REPLACEMENT, TRANSFEMORAL: SHX6400

## 2019-06-06 HISTORY — DX: Presence of prosthetic heart valve: Z95.2

## 2019-06-06 HISTORY — DX: Nonrheumatic aortic (valve) stenosis: I35.0

## 2019-06-06 HISTORY — PX: TEE WITHOUT CARDIOVERSION: SHX5443

## 2019-06-06 LAB — POCT I-STAT, CHEM 8
BUN: 28 mg/dL — ABNORMAL HIGH (ref 8–23)
Calcium, Ion: 1.16 mmol/L (ref 1.15–1.40)
Chloride: 108 mmol/L (ref 98–111)
Creatinine, Ser: 0.8 mg/dL (ref 0.44–1.00)
Glucose, Bld: 111 mg/dL — ABNORMAL HIGH (ref 70–99)
HCT: 28 % — ABNORMAL LOW (ref 36.0–46.0)
Hemoglobin: 9.5 g/dL — ABNORMAL LOW (ref 12.0–15.0)
Potassium: 3.9 mmol/L (ref 3.5–5.1)
Sodium: 139 mmol/L (ref 135–145)
TCO2: 19 mmol/L — ABNORMAL LOW (ref 22–32)

## 2019-06-06 LAB — POCT I-STAT 4, (NA,K, GLUC, HGB,HCT)
Glucose, Bld: 95 mg/dL (ref 70–99)
Glucose, Bld: 104 mg/dL — ABNORMAL HIGH (ref 70–99)
HCT: 35 % — ABNORMAL LOW (ref 36.0–46.0)
HCT: 29 % — ABNORMAL LOW (ref 36.0–46.0)
Hemoglobin: 11.9 g/dL — ABNORMAL LOW (ref 12.0–15.0)
Hemoglobin: 9.9 g/dL — ABNORMAL LOW (ref 12.0–15.0)
Potassium: 4.2 mmol/L (ref 3.5–5.1)
Potassium: 4.1 mmol/L (ref 3.5–5.1)
Sodium: 136 mmol/L (ref 135–145)
Sodium: 137 mmol/L (ref 135–145)

## 2019-06-06 SURGERY — IMPLANTATION, AORTIC VALVE, TRANSCATHETER, FEMORAL APPROACH
Anesthesia: Monitor Anesthesia Care

## 2019-06-06 MED ORDER — SODIUM CHLORIDE 0.9 % IV SOLN
INTRAVENOUS | Status: DC
  Administered 2019-06-06 (×2): via INTRAVENOUS

## 2019-06-06 MED ORDER — SODIUM CHLORIDE 0.9% FLUSH: 3.0000 mL | INTRAVENOUS | Status: DC | PRN

## 2019-06-06 MED ORDER — LACTATED RINGERS IV SOLN: INTRAVENOUS | Status: DC | PRN

## 2019-06-06 MED ORDER — ONDANSETRON HCL 4 MG/2ML IJ SOLN: 4.0000 mg | Freq: Four times a day (QID) | INTRAMUSCULAR | Status: DC | PRN

## 2019-06-06 MED ORDER — PROTAMINE SULFATE 10 MG/ML IV SOLN
INTRAVENOUS | Status: DC | PRN
  Administered 2019-06-06: 10 mg via INTRAVENOUS
  Administered 2019-06-06: 90 mg via INTRAVENOUS

## 2019-06-06 MED ORDER — CHLORHEXIDINE GLUCONATE 0.12 % MT SOLN
15.0000 mL | Freq: Once | OROMUCOSAL | Status: AC
  Administered 2019-06-06: 15 mL via OROMUCOSAL
  Filled 2019-06-06: qty 15

## 2019-06-06 MED ORDER — SODIUM CHLORIDE 0.9% FLUSH
3.0000 mL | Freq: Two times a day (BID) | INTRAVENOUS | Status: DC
  Administered 2019-06-07: 3 mL via INTRAVENOUS

## 2019-06-06 MED ORDER — LIDOCAINE HCL (PF) 1 % IJ SOLN
INTRAMUSCULAR | Status: DC | PRN
  Administered 2019-06-06 (×2): 5 mL via SUBCUTANEOUS

## 2019-06-06 MED ORDER — LACTATED RINGERS IV SOLN
INTRAVENOUS | Status: DC | PRN
  Administered 2019-06-06: 13:00:00 via INTRAVENOUS

## 2019-06-06 MED ORDER — CHLORHEXIDINE GLUCONATE 4 % EX LIQD
60.0000 mL | Freq: Once | CUTANEOUS | Status: DC
  Filled 2019-06-06: qty 60

## 2019-06-06 MED ORDER — PHENYLEPHRINE HCL (PRESSORS) 10 MG/ML IV SOLN
INTRAVENOUS | Status: DC | PRN
  Administered 2019-06-06: 80 ug via INTRAVENOUS
  Administered 2019-06-06: 120 ug via INTRAVENOUS

## 2019-06-06 MED ORDER — PROPOFOL 10 MG/ML IV BOLUS
INTRAVENOUS | Status: DC | PRN
  Administered 2019-06-06: 10 mg via INTRAVENOUS
  Administered 2019-06-06: 20 mg via INTRAVENOUS
  Administered 2019-06-06 (×2): 10 mg via INTRAVENOUS

## 2019-06-06 MED ORDER — ASPIRIN 81 MG PO CHEW
81.0000 mg | CHEWABLE_TABLET | Freq: Every day | ORAL | Status: DC
  Administered 2019-06-07: 81 mg via ORAL
  Filled 2019-06-06: qty 1

## 2019-06-06 MED ORDER — EPHEDRINE SULFATE 50 MG/ML IJ SOLN
INTRAMUSCULAR | Status: DC | PRN
  Administered 2019-06-06: 5 mg via INTRAVENOUS

## 2019-06-06 MED ORDER — NITROGLYCERIN IN D5W 200-5 MCG/ML-% IV SOLN: 0.0000 ug/min | INTRAVENOUS | Status: DC

## 2019-06-06 MED ORDER — ONDANSETRON HCL 4 MG/2ML IJ SOLN
INTRAMUSCULAR | Status: DC | PRN
  Administered 2019-06-06: 4 mg via INTRAVENOUS

## 2019-06-06 MED ORDER — HEPARIN (PORCINE) IN NACL 1000-0.9 UT/500ML-% IV SOLN
INTRAVENOUS | Status: AC
  Filled 2019-06-06: qty 1500

## 2019-06-06 MED ORDER — PROPOFOL 500 MG/50ML IV EMUL
INTRAVENOUS | Status: DC | PRN
  Administered 2019-06-06: 10 ug/kg/min via INTRAVENOUS

## 2019-06-06 MED ORDER — ACETAMINOPHEN 325 MG PO TABS
650.0000 mg | ORAL_TABLET | Freq: Four times a day (QID) | ORAL | Status: DC | PRN
  Administered 2019-06-06 – 2019-06-07 (×2): 650 mg via ORAL
  Filled 2019-06-06 (×2): qty 2

## 2019-06-06 MED ORDER — ACETAMINOPHEN 650 MG RE SUPP: 650.0000 mg | Freq: Four times a day (QID) | RECTAL | Status: DC | PRN

## 2019-06-06 MED ORDER — CHLORHEXIDINE GLUCONATE 4 % EX LIQD
30.0000 mL | CUTANEOUS | Status: DC
  Filled 2019-06-06: qty 30

## 2019-06-06 MED ORDER — SODIUM CHLORIDE 0.9 % IV SOLN
250.0000 mL | INTRAVENOUS | Status: DC | PRN
  Administered 2019-06-07: 250 mL via INTRAVENOUS

## 2019-06-06 MED ORDER — IOHEXOL 350 MG/ML SOLN
INTRAVENOUS | Status: DC | PRN
  Administered 2019-06-06: 40 mL via INTRA_ARTERIAL

## 2019-06-06 MED ORDER — HEPARIN (PORCINE) IN NACL 1000-0.9 UT/500ML-% IV SOLN
INTRAVENOUS | Status: DC | PRN
  Administered 2019-06-06 (×3): 500 mL

## 2019-06-06 MED ORDER — PHENYLEPHRINE HCL-NACL 20-0.9 MG/250ML-% IV SOLN
0.0000 ug/min | INTRAVENOUS | Status: DC
  Filled 2019-06-06: qty 250

## 2019-06-06 MED ORDER — SODIUM CHLORIDE 0.9 % IV SOLN
INTRAVENOUS | Status: AC
  Administered 2019-06-06: 500 mL/h via INTRAVENOUS

## 2019-06-06 MED ORDER — VANCOMYCIN HCL IN DEXTROSE 1-5 GM/200ML-% IV SOLN
1000.0000 mg | Freq: Once | INTRAVENOUS | Status: AC
  Administered 2019-06-07: 1000 mg via INTRAVENOUS
  Filled 2019-06-06: qty 200

## 2019-06-06 MED ORDER — ALBUMIN HUMAN 5 % IV SOLN
INTRAVENOUS | Status: DC | PRN
  Administered 2019-06-06: 13:00:00 via INTRAVENOUS

## 2019-06-06 MED ORDER — DEXMEDETOMIDINE HCL IN NACL 400 MCG/100ML IV SOLN
INTRAVENOUS | Status: DC | PRN
  Administered 2019-06-06: 24.96 ug via INTRAVENOUS

## 2019-06-06 MED ORDER — HEPARIN SODIUM (PORCINE) 1000 UNIT/ML IJ SOLN
INTRAMUSCULAR | Status: DC | PRN
  Administered 2019-06-06: 7000 [IU] via INTRAVENOUS
  Administered 2019-06-06: 3000 [IU] via INTRAVENOUS

## 2019-06-06 MED ORDER — CHLORHEXIDINE GLUCONATE 0.12 % MT SOLN
OROMUCOSAL | Status: AC
  Administered 2019-06-06: 15 mL via OROMUCOSAL
  Filled 2019-06-06: qty 15

## 2019-06-06 MED ORDER — LIDOCAINE HCL (PF) 1 % IJ SOLN
INTRAMUSCULAR | Status: AC
  Filled 2019-06-06: qty 30

## 2019-06-06 MED ORDER — SODIUM CHLORIDE 0.9 % IV SOLN
1.5000 g | Freq: Two times a day (BID) | INTRAVENOUS | Status: DC
  Administered 2019-06-07 (×2): 1.5 g via INTRAVENOUS
  Filled 2019-06-06 (×3): qty 1.5

## 2019-06-06 SURGICAL SUPPLY — 35 items
BAG SNAP BAND KOVER 36X36 (MISCELLANEOUS) ×4 IMPLANT
CABLE ADAPT PACING TEMP 12FT (ADAPTER) ×1 IMPLANT
CATH DIAG 6FR PIGTAIL ANGLED (CATHETERS) ×2 IMPLANT
CATH INFINITI 6F AL1 (CATHETERS) ×1 IMPLANT
CATH S G BIP PACING (CATHETERS) ×1 IMPLANT
CLOSURE MYNX CONTROL 6F/7F (Vascular Products) ×1 IMPLANT
COVER DOME SNAP 22 D (MISCELLANEOUS) ×1 IMPLANT
CRIMPER (MISCELLANEOUS) ×2 IMPLANT
DEVICE CLOSURE PERCLS PRGLD 6F (VASCULAR PRODUCTS) IMPLANT
DEVICE INFLATION ATRION QL2530 (MISCELLANEOUS) ×1 IMPLANT
ELECT DEFIB PAD ADLT CADENCE (PAD) ×1 IMPLANT
GUIDEWIRE SAFE TJ AMPLATZ EXST (WIRE) ×1 IMPLANT
KIT HEART LEFT (KITS) ×2 IMPLANT
KIT MICROPUNCTURE NIT STIFF (SHEATH) ×1 IMPLANT
PACK CARDIAC CATHETERIZATION (CUSTOM PROCEDURE TRAY) ×2 IMPLANT
PERCLOSE PROGLIDE 6F (VASCULAR PRODUCTS) ×4
SHEATH 14X36 EDWARDS (SHEATH) ×2 IMPLANT
SHEATH BRITE TIP 7FR 35CM (SHEATH) ×1 IMPLANT
SHEATH PINNACLE 6F 10CM (SHEATH) ×1 IMPLANT
SHEATH PINNACLE 8F 10CM (SHEATH) ×2 IMPLANT
SHEATH PROBE COVER 6X72 (BAG) ×1 IMPLANT
SLEEVE REPOSITIONING LENGTH 30 (MISCELLANEOUS) ×1 IMPLANT
STOPCOCK MORSE 400PSI 3WAY (MISCELLANEOUS) ×3 IMPLANT
SYR MEDRAD MARK 7 150ML (SYRINGE) ×1 IMPLANT
TRANSDUCER W/STOPCOCK (MISCELLANEOUS) ×4 IMPLANT
TUBE CONN 8.8X1320 FR HP M-F (CONNECTOR) ×1 IMPLANT
TUBING ART PRESS 72  MALE/FEM (TUBING) ×2
TUBING ART PRESS 72 MALE/FEM (TUBING) IMPLANT
TUBING CIL FLEX 10 FLL-RA (TUBING) ×1 IMPLANT
VALVE 23 ULTRA SAPIEN KIT (Valve) ×1 IMPLANT
WIRE AMPLATZ SS-J .035X180CM (WIRE) ×1 IMPLANT
WIRE EMERALD 3MM-J .035X150CM (WIRE) ×2 IMPLANT
WIRE EMERALD 3MM-J .035X260CM (WIRE) ×1 IMPLANT
WIRE EMERALD ST .035X260CM (WIRE) ×1 IMPLANT
WIRE HI TORQ VERSACORE-J 145CM (WIRE) ×1 IMPLANT

## 2019-06-06 NOTE — Plan of Care (Signed)
  Problem: Education: Goal: Knowledge of General Education information will improve Description: Including pain rating scale, medication(s)/side effects and non-pharmacologic comfort measures Outcome: Progressing   Problem: Health Behavior/Discharge Planning: Goal: Ability to manage health-related needs will improve Outcome: Progressing   Problem: Clinical Measurements: Goal: Ability to maintain clinical measurements within normal limits will improve Outcome: Progressing Goal: Will remain free from infection Outcome: Progressing Goal: Diagnostic test results will improve Outcome: Progressing Goal: Cardiovascular complication will be avoided Outcome: Progressing   Problem: Activity: Goal: Risk for activity intolerance will decrease Outcome: Progressing   Problem: Nutrition: Goal: Adequate nutrition will be maintained Outcome: Progressing   Problem: Elimination: Goal: Will not experience complications related to bowel motility Outcome: Progressing   Problem: Pain Managment: Goal: General experience of comfort will improve Outcome: Progressing   Problem: Safety: Goal: Ability to remain free from injury will improve Outcome: Progressing   Problem: Skin Integrity: Goal: Risk for impaired skin integrity will decrease Outcome: Progressing   Problem: Clinical Measurements: Goal: Respiratory complications will improve Outcome: Not Applicable   Problem: Coping: Goal: Level of anxiety will decrease Outcome: Not Applicable

## 2019-06-06 NOTE — Progress Notes (Signed)
  [facility]   MULTIDISCIPLINARY HEART VALVE TEAM  Called by cath lab nursing staff who were unable to doppler a pulse in patient's right foot. DP and popliteal pulse recorded as 2+ prior to procedure. Dr. Burt Knack came to bedside and also not able to doppler a pulse. Right foot slightly cooler than left. Patient complains of right LE pain but reports chronic right leg pain. Plan for stat arterial doppler of right leg.   Angelena Form PA-C  [facility]

## 2019-06-06 NOTE — Progress Notes (Signed)
The patient is complaining of mild pain in her right foot.  Prior to the patient's procedure, there is documentation of a palpable right DP pulse.  I am not able to obtain a DP pulse or PT pulse in the right foot even with Doppler.  The patient does have a palpable femoral pulse that feels strong even distal to the arteriotomy.  We ordered a stat arterial Doppler study which demonstrated wide patency of the common femoral and superficial femoral arteries.  The below-knee popliteal had a dampened waveform in the tibial vessels had very blunted waveforms present.  By the time the study was completed, the patient's foot pain had completely resolved and she has no motor deficit.  Will continue to observe her overnight.  Sherren Mocha 06/06/2019 5:40 PM

## 2019-06-06 NOTE — Anesthesia Postprocedure Evaluation (Signed)
Anesthesia Post Note  Patient: Catherine Anthony  Procedure(s) Performed: TRANSCATHETER AORTIC VALVE REPLACEMENT, TRANSFEMORAL (N/A ) TRANSESOPHAGEAL ECHOCARDIOGRAM (TEE) (N/A )     Patient location during evaluation: PACU Anesthesia Type: MAC Level of consciousness: awake and alert Pain management: pain level controlled Vital Signs Assessment: post-procedure vital signs reviewed and stable Respiratory status: spontaneous breathing and respiratory function stable Cardiovascular status: stable Postop Assessment: no apparent nausea or vomiting Anesthetic complications: no    Last Vitals:  Vitals:   06/06/19 1502 06/06/19 1526  BP:  (!) 84/46  Pulse: (!) 103 66  Resp:  (!) 22  Temp:  (!) 35.1 C  SpO2:      Last Pain:  Vitals:   06/06/19 1526  TempSrc: Temporal  PainSc: 0-No pain                 Jenniger Figiel DANIEL

## 2019-06-06 NOTE — Anesthesia Preprocedure Evaluation (Signed)
Anesthesia Evaluation  Patient identified by MRN, date of birth, ID band Patient awake    Reviewed: Allergy & Precautions, NPO status , Patient's Chart, lab work & pertinent test results, reviewed documented beta blocker date and time   Airway Mallampati: II  TM Distance: >3 FB     Dental  (+) Dental Advisory Given   Pulmonary shortness of breath,    breath sounds clear to auscultation       Cardiovascular hypertension, Pt. on medications and Pt. on home beta blockers + CAD  + Valvular Problems/Murmurs AS  Rhythm:Regular Rate:Normal + Systolic murmurs    Neuro/Psych  Headaches,    GI/Hepatic negative GI ROS, Neg liver ROS,   Endo/Other  negative endocrine ROS  Renal/GU Renal InsufficiencyRenal disease     Musculoskeletal  (+) Arthritis ,   Abdominal   Peds  Hematology negative hematology ROS (+)   Anesthesia Other Findings   Reproductive/Obstetrics                             Lab Results  Component Value Date   WBC 9.2 06/01/2019   HGB 14.2 06/01/2019   HCT 42.2 06/01/2019   MCV 98.8 06/01/2019   PLT 322 06/01/2019   Lab Results  Component Value Date   CREATININE 1.23 (H) 06/01/2019   BUN 26 (H) 06/01/2019   NA 133 (L) 06/01/2019   K 4.2 06/01/2019   CL 104 06/01/2019   CO2 16 (L) 06/01/2019    Anesthesia Physical Anesthesia Plan  ASA: IV  Anesthesia Plan: MAC   Post-op Pain Management:    Induction:   PONV Risk Score and Plan: 2 and Propofol infusion, Ondansetron and Treatment may vary due to age or medical condition  Airway Management Planned: Natural Airway and Simple Face Mask  Additional Equipment: Arterial line  Intra-op Plan:   Post-operative Plan:   Informed Consent: I have reviewed the patients History and Physical, chart, labs and discussed the procedure including the risks, benefits and alternatives for the proposed anesthesia with the patient or  authorized representative who has indicated his/her understanding and acceptance.     Dental advisory given  Plan Discussed with: CRNA  Anesthesia Plan Comments:         Anesthesia Quick Evaluation

## 2019-06-06 NOTE — Anesthesia Procedure Notes (Signed)
Arterial Line Insertion Start/End7/05/2019 11:40 AM, 06/06/2019 11:46 AM Performed by: Wilburn Cornelia, CRNA, CRNA  Patient location: Pre-op. Preanesthetic checklist: patient identified, IV checked, site marked, risks and benefits discussed, surgical consent, monitors and equipment checked, pre-op evaluation, timeout performed and anesthesia consent Lidocaine 1% used for infiltration Right, radial was placed Catheter size: 20 G Maximum sterile barriers used   Attempts: 1 Procedure performed without using ultrasound guided technique. Following insertion, Biopatch. Post procedure assessment: normal  Patient tolerated the procedure well with no immediate complications.

## 2019-06-06 NOTE — Anesthesia Procedure Notes (Signed)
Procedure Name: MAC Date/Time: 06/06/2019 1:25 PM Performed by: Leonor Liv, CRNA Pre-anesthesia Checklist: Patient identified, Emergency Drugs available, Suction available and Patient being monitored Patient Re-evaluated:Patient Re-evaluated prior to induction Oxygen Delivery Method: Simple face mask Placement Confirmation: positive ETCO2 Dental Injury: Teeth and Oropharynx as per pre-operative assessment

## 2019-06-06 NOTE — Progress Notes (Signed)
Pt received from PACU. VSS. Bilateral groin sites, level 0. Telemetry applied. CHG bath complete. Will continue to monitor.  Clyde Canterbury, RN

## 2019-06-06 NOTE — Progress Notes (Signed)
Right lower extremity arterial duplex has been completed. Preliminary results can be found in CV Proc through chart review.   06/06/19 6:35 PM Carlos Levering RVT

## 2019-06-06 NOTE — Transfer of Care (Signed)
Immediate Anesthesia Transfer of Care Note  Patient: Catherine Anthony  Procedure(s) Performed: TRANSCATHETER AORTIC VALVE REPLACEMENT, TRANSFEMORAL (N/A ) TRANSESOPHAGEAL ECHOCARDIOGRAM (TEE) (N/A )  Patient Location: Cath Lab  Anesthesia Type:MAC  Level of Consciousness: awake, alert  and oriented  Airway & Oxygen Therapy: Patient Spontanous Breathing and Patient connected to nasal cannula oxygen  Post-op Assessment: Report given to RN, Post -op Vital signs reviewed and stable and Patient moving all extremities  Post vital signs: Reviewed and stable  Last Vitals:  Vitals Value Taken Time  BP 84/46 06/06/19 1529  Temp 35.1 C 06/06/19 1526  Pulse 66 06/06/19 1531  Resp 20 06/06/19 1531  SpO2 93 % 06/06/19 1531  Vitals shown include unvalidated device data.  Last Pain:  Vitals:   06/06/19 1526  TempSrc: Temporal  PainSc: 0-No pain      Patients Stated Pain Goal: 2 ([id number])  Complications: No apparent anesthesia complications

## 2019-06-06 NOTE — Progress Notes (Signed)
    Unable to palate or obtained with doppler R DP or R PT. Multiple staff members attempted to obtain pulses with no success. L D/P and L PT were palpable. Orlene Plum PA was paged, and eventually Dr Burt Knack came as well. They assessed the patient and ordered R lower extremity arterial duplex.

## 2019-06-06 NOTE — H&P (Signed)
[address]       [place] [address]             [phone]      Cardiothoracic Surgery Admission History and Physical   Referring Provider is Patwardhan, Anabel Bene, MD  PCP is [name], MD      Chief Complaint  Patient presents with   Aortic Stenosis      HPI:  Patient is an 85 year old female with history of coronary artery disease, aortic stenosis, hypertension, and hyperlipidemia referred for surgical consultation to discuss treatment options for management of severe paradoxical low flow, low gradient with preserved left ventricular ejection fraction symptomatic aortic stenosis.  Patient has remained relatively active physically and functionally independent throughout her adult life. Several years ago she began to develop decreased exercise tolerance with exertional shortness of breath and increasing fatigue. She was evaluated by Dr. Rosemary Holms and found to have moderate aortic stenosis and severe multivessel coronary artery disease. She underwent multivessel PCI in staged procedures including a total of 6 drug-eluting stents (1 in RCA, 2 in LAD, 3 in LCx). She states that she thinks she may have enjoyed some improvement after this, but over the past 2 years she has continued to experience decreased energy, loss of appetite, and exertional shortness of breath. Follow-up transthoracic echocardiogram revealed findings consistent with severe paroxysmal low flow, low gradient with preserved left ventricular ejection fraction aortic stenosis. Left ventricular ejection fraction was estimated 66%. Peak velocity across aortic valve measured greater than 3.4 m/s corresponding to mean transvalvular gradient estimated 28 mmHg. The DVI was reported 0.20 with aortic valve area calculated 0.73 cm. She underwent diagnostic cardiac catheterization on May 16, 2019. Mean transvalvular gradient was measured 39 mmHg at the time of catheterization, corresponding to aortic valve  area calculated 0.6 cm. Coronary angiography revealed continued patency of all previously placed stents with no significant obstructive disease. She was referred to the multidisciplinary heart valve clinic and has been evaluated previously by Dr. Excell Seltzer. CT angiography was performed and the patient was referred for surgical consultation.  Patient has been widowed for more than 30 years and lives with her daughter in [place]. She has remained functionally independent throughout retirement. Up until several months ago she still drove an automobile. She complains that she has been slowing down for the last few years. She gets short of breath with moderate activity. She tires easily. She denies any chest pain or chest tightness either with activity or at rest. She has not had dizzy spells or syncope. She denies any history of resting shortness of breath, PND, orthopnea, or lower extremity edema. Her mobility and activity is limited only by dyspnea and fatigue. She does not have significant arthritis.      Past Medical History:  Diagnosis Date   Complication of anesthesia    Coronary artery disease    Family hx of colon cancer    High cholesterol    Hypertension    Migraine    "none in years" (09/21/2017)        Past Surgical History:  Procedure Laterality Date   CATARACT EXTRACTION W/ INTRAOCULAR LENS IMPLANT Right 06/04/2011   [facility]   CATARACT EXTRACTION W/PHACO  06/22/2011   Procedure: CATARACT EXTRACTION PHACO AND INTRAOCULAR LENS PLACEMENT (IOC); Surgeon: Gemma Payor; Location: [facility]; Service: Ophthalmology; Laterality: Left; CDE 14.34   COLONOSCOPY  06/23/08   COLONOSCOPY N/A 01/18/2014   Procedure: COLONOSCOPY; Surgeon: Malissa Hippo, MD; Location: [facility]  END[facility] SUITE; Service: Endoscopy; Laterality: N/A; 145   CORONARY ANGIOPLASTY WITH STENT PLACEMENT     CORONARY ATHERECTOMY N/A 09/21/2017   Procedure: CORONARY ATHERECTOMY; Surgeon: Elder Negus, MD; Location: [facility]; Service: Cardiovascular; Laterality: N/A;   CORONARY STENT INTERVENTION N/A 08/31/2017   Procedure: CORONARY STENT INTERVENTION; Surgeon: Elder Negus, MD; [facility]; Service: Cardiovascular; Laterality: N/A;   CORONARY STENT INTERVENTION N/A 09/21/2017   Procedure: CORONARY STENT INTERVENTION; Surgeon: Elder Negus, MD; [facility]: [facility]; Service: Cardiovascular; Laterality: N/A;   EYE SURGERY Bilateral    "after cataract OR"   INTRAVASCULAR ULTRASOUND/IVUS N/A 09/21/2017   Procedure: Intravascular Ultrasound/IVUS; Surgeon: Elder Negus, MD; Location: [facility]; Service: Cardiovascular; Laterality: N/A;   RIGHT/LEFT HEART CATH AND CORONARY ANGIOGRAPHY N/A 08/31/2017   Procedure: RIGHT/LEFT HEART CATH AND CORONARY ANGIOGRAPHY; Surgeon: Elder Negus, MD; Location: [facility]; Service: Cardiovascular; Laterality: N/A;   RIGHT/LEFT HEART CATH AND CORONARY ANGIOGRAPHY N/A 05/16/2019   Procedure: RIGHT/LEFT HEART CATH AND CORONARY ANGIOGRAPHY; Surgeon: Elder Negus, MD; [facility]; Service: Cardiovascular; Laterality: N/A;   ULTRASOUND GUIDANCE FOR VASCULAR ACCESS  09/21/2017   Procedure: Ultrasound Guidance For Vascular Access; Surgeon: Elder Negus, MD; Location: [facility]; Service: Cardiovascular;;   VARICOSE VEIN SURGERY Bilateral 1960s   [facility]  History reviewed. No pertinent family history.  Social History        Socioeconomic History   Marital status: Widowed    Spouse name: Not on file   Number of children: 5   Years of education: Not on file   Highest education level: Not on file  Occupational History   Not on file  Social Needs   Financial resource strain: Not on file   Food insecurity    Worry: Not on file    Inability: Not on file   Transportation needs    Medical: Not on file    Non-medical: Not on file  Tobacco Use   Smoking  status: Never Smoker   Smokeless tobacco: Never Used  Substance and Sexual Activity   Alcohol use: No   Drug use: No   Sexual activity: Not on file  Lifestyle   Physical activity    Days per week: Not on file    Minutes per session: Not on file   Stress: Not on file  Relationships   Social connections    Talks on phone: Not on file    Gets together: Not on file    Attends religious service: Not on file    Active member of club or organization: Not on file    Attends meetings of clubs or organizations: Not on file    Relationship status: Not on file   Intimate partner violence    Fear of current or ex partner: Not on file    Emotionally abused: Not on file    Physically abused: Not on file    Forced sexual activity: Not on file  Other Topics Concern   Not on file  Social History Narrative   Not on file         Current Outpatient Medications  Medication Sig Dispense Refill   amLODipine (NORVASC) 10 MG tablet Take 10 mg by mouth daily.      atenolol (TENORMIN) 100 MG tablet Take 100 mg by mouth daily.      diphenhydrAMINE (BENADRYL) 25 MG tablet Take 25 mg by mouth daily as  needed for allergies.     ELIQUIS 2.5 MG TABS tablet TAKE (1) TABLET BY MOUTH TWICE DAILY. (Patient taking differently: Take 2.5 mg by mouth 2 (two) times daily. ) 180 tablet 3   lisinopril (PRINIVIL,ZESTRIL) 40 MG tablet Take 40 mg by mouth daily.      spironolactone (ALDACTONE) 25 MG tablet Take 25 mg by mouth daily.      No current facility-administered medications for this visit.        Allergies  Allergen Reactions   Codeine Nausea And Vomiting  Review of Systems:  General: poor appetite, decreased energy, no weight gain, some weight loss, no fever  Cardiac: no chest pain with exertion, no chest pain at rest, +SOB with exertion, no resting SOB, no PND, no orthopnea, no palpitations, no arrhythmia, no atrial fibrillation, no LE edema, no dizzy spells, no syncope  Respiratory: no  shortness of breath, no home oxygen, no productive cough, no dry cough, no bronchitis, no wheezing, no hemoptysis, no asthma, no pain with inspiration or cough, no sleep apnea, no CPAP at night  GI: no difficulty swallowing, no reflux, no frequent heartburn, no hiatal hernia, no abdominal pain, no constipation, no diarrhea, no hematochezia, no hematemesis, no melena  GU: no dysuria, no frequency, no urinary tract infection, no hematuria, no kidney stones, no kidney disease  Vascular: no pain suggestive of claudication, no pain in feet, no leg cramps, no varicose veins, no DVT, no non-healing foot ulcer  Neuro: no stroke, no TIA's, no seizures, no headaches, no temporary blindness one eye, no slurred speech, no peripheral neuropathy, no chronic pain, no instability of gait, no memory/cognitive dysfunction  Musculoskeletal: no arthritis, no joint swelling, no myalgias, no difficulty walking, normal mobility  Skin: no rash, no itching, no skin infections, no pressure sores or ulcerations  Psych: no anxiety, no depression, no nervousness, no unusual recent stress  Eyes: no blurry vision, no floaters, no recent vision changes, + wears glasses or contacts  ENT: no hearing loss, no loose or painful teeth, no dentures, last saw dentist 2 years ago  Hematologic: no easy bruising, no abnormal bleeding, no clotting disorder, no frequent epistaxis  Endocrine: no diabetes, does not check CBG's at home  Physical Exam:  BP 119/73 (BP Location: Right Arm, Patient Position: Sitting, Cuff Size: Normal)   Pulse 66   Temp (!) 97.5 F (36.4 C)   Resp 18   Ht  (1.626 m)   Wt 110 lb (49.9 kg)   SpO2 95% Comment: RA   BMI 18.88 kg/m  General: Elderly and somewhat frail-appearing  HEENT: Unremarkable  Neck: no JVD, no bruits, no adenopathy  Chest: clear to auscultation, symmetrical breath sounds, no wheezes, no rhonchi  CV: RRR, grade III/VI crescendo/decrescendo murmur heard best at RSB, no diastolic murmur    Abdomen: soft, non-tender, no masses  Extremities: warm, well-perfused, pulses diminished but palpable, no LE edema  Rectal/GU Deferred  Neuro: Grossly non-focal and symmetrical throughout  Skin: Clean and dry, no rashes, no breakdown  Diagnostic Tests:  TRANSTHORACIC ECHOCARDIOGRAM  Both report and images from transthoracic echocardiogram performed at [facility] on May 01, 2019 are reviewed. The aortic valve is trileaflet with moderate to severe thickening, calcification, and restricted leaflet mobility involving all 3 leaflets of the aortic valve. Peak velocity across the aortic valve measured close to 3.5 m/s corresponding to mean transvalvular gradient estimated 28 mmHg. The size of the aortic valve and aortic root is relatively small. Aortic valve area  was calculated 0.73 cm. There is normal left ventricular function. The DVI was reported 0.20  RIGHT/LEFT HEART CATH AND CORONARY ANGIOGRAPHY  Conclusion  LM: Normal  LAD: Patent prox-mid LAD stent. No ISR. No other significant disease.  LCx: Patent prox-mid LCx stent. No ISR. No other significant disease.  RCA: Patent prox RCA stent. Minimal 10% late lumen loss. No significant ISR. No other significant disease  No obstructive CAD  Severe aortic valve stenosis (Simultaneous LV-Ao hemodynamic assessment)  Mean PG 39 mmHg, AVA 0.6 cm2, AVAi 0.4 cm2/m2  Recommendation: TAVR workup  Elder NegusManish J Patwardhan, MD  [facility]  Pager: [phone]  Office: [phone]  If no answer Cell [phone]   Recommendations  Antiplatelet/Anticoag Anticoagulation with Eliquis.  Procedural Details  Technical Details Procedures: 1. Right heart catheterization 2. Left heart catheterization 3. Selective left and right coronary angiography 4. Conscious sedation monitoring 49 min  Indication: Severe AS  History: 85 year old Caucasian female with hypertension, CAD s/p successful diamondback orbital atherectomy and  multivessel PCI to LAD, left circumflex, proximal to mid RCA (08/2017), paroxysmal Afib, now with severe aortic stenosis. She is here for pre TAVR workup.   Diagnostic Angiography: Catheter/s advances over guidewire under fluoroscopy Left coronary artery: 5 Fr AL-1  Right coronary artery: 5 Fr JR 4 Left heart catheterization: 5 Fr Langston catheter (Aortic valve crossed using AL-1 catheter without difficulty and [facility])  Pressures tracings obtained in right atrium, right ventricle, pulmonary artery, and pulmonary capillary wedge position.   Anticoagulation:  3000 units heparin  Hemostasis: TR band  Total contrast used: 35 cc   Total fluoro time: 11.9 min Air Kerma: 125 mGy  All wires and catheters removed out of the body at the end of the procedure Final angiogram showed no dissection/perforation         Estimated blood loss <50 mL.   During this procedure medications were administered to achieve and maintain moderate conscious sedation while the patient's heart rate, blood pressure, and oxygen saturation were continuously monitored and I was present face-to-face 100% of this time.  Medications  (Filter: Administrations occurring from 05/16/19 1247 to 05/16/19 1403)          Medication Rate/Dose/Volume Action  Date Time   Heparin (Porcine) in NaCl 1000-0.9 UT/500ML-% SOLN (mL) 500 mL Given 05/16/19 1252   Total dose as of 05/16/19 1403 500 mL Given 1252   1,000 mL        midazolam (VERSED) injection (mg) 0.5 mg Given 05/16/19 1304   Total dose as of 05/16/19 1403        0.5 mg        fentaNYL (SUBLIMAZE) injection (mcg) 12.5 mcg Given 05/16/19 1304   Total dose as of 05/16/19 1403        12.5 mcg        lidocaine (PF) (XYLOCAINE) 1 % injection (mL) 2 mL Given 05/16/19 1307   Total dose as of 05/16/19 1403 2 mL Given 1309   4 mL        Radial Cocktail/Verapamil only (mL) 10 mL Given 05/16/19 1310   Total dose as of 05/16/19 1403        10  mL        heparin injection (Units) 3,000 Units Given 05/16/19 1332   Total dose as of 05/16/19 1403        3,000 Units        iohexol (OMNIPAQUE) 350 MG/ML injection (mL) 35 mL Given 05/16/19 1358  Total dose as of 05/16/19 1403        35 mL        Sedation Time  Sedation Time Physician-1: 48 minutes 47 seconds  Complications  Complications documented before study signed (05/16/2019 2:10 PM)   RIGHT/LEFT HEART CATH AND CORONARY ANGIOGRAPHY   None Documented by Nigel Mormon, MD 05/16/2019 2:06 PM  Date Found: 05/16/2019  Time Range: Intraprocedure    Coronary Findings  Diagnostic  Dominance: Right  Left Anterior Descending  Prox LAD to Mid LAD lesion 0% stenosed  Previously placed Prox LAD to Mid LAD drug eluting stent is widely patent. The lesion is calcified.  Left Circumflex  Prox Cx to Mid Cx lesion 0% stenosed  Previously placed Prox Cx to Mid Cx drug eluting stent is widely patent. The lesion is calcified.  Right Coronary Artery  Prox RCA lesion 10% stenosed  Prox RCA lesion is 10% stenosed. The lesion was previously treated. 10% late lumen loss. No significant ISR.  Intervention  No interventions have been documented.  Right Heart  Right Heart Pressures RA: 1 mmHg RV: 20/0 mmHg PA: 21/2 mmHg. Mean PA 10 mmHg PCWP: 4 mmHg  Left Heart  Left Ventricle LV end diastolic pressure is normal. LV 156/0 mmHg, LVEDP 13 mmHg Ao 127/49 mmHg.  AoV mean PG 39.6 mmHg AVA 0.64 cm2 AVAi 0.42 cm2/m2  Coronary Diagrams  Diagnostic  Dominance: Right   Intervention  Implants     No implant documentation for this case.  Syngo Images  Link to Procedure Log   Show images for CARDIAC CATHETERIZATION Procedure Log  Images on Long Term Storage    Show images for Mariam, Helbert   Hemo Data   Most Recent Value  Fick Cardiac Output 3.77 L/min  Fick Cardiac Output Index 2.48 (L/min)/BSA  Aortic Mean Gradient 39.55 mmHg  Aortic Peak Gradient 29 mmHg  Aortic Valve Area 0.64    Aortic Value Area Index 0.42 cm2/BSA  RA A Wave 1 mmHg  RA V Wave 0 mmHg  RA Mean 0 mmHg  RV Systolic Pressure 20 mmHg  RV Diastolic Pressure -2 mmHg  RV EDP 1 mmHg  PA Systolic Pressure 21 mmHg  PA Diastolic Pressure 2 mmHg  PA Mean 10 mmHg  PW A Wave 8 mmHg  PW V Wave 5 mmHg  PW Mean 4 mmHg  AO Systolic Pressure 443 mmHg  AO Diastolic Pressure 49 mmHg  AO Mean 80 mmHg  LV Systolic Pressure 154 mmHg  LV Diastolic Pressure 0 mmHg  LV EDP 10 mmHg  AOp Systolic Pressure 008 mmHg  AOp Diastolic Pressure 48 mmHg  AOp Mean Pressure 79 mmHg  LVp Systolic Pressure 676 mmHg  LVp Diastolic Pressure 0 mmHg  LVp EDP Pressure 13 mmHg  QP/QS 1  TPVR Index 4.03 HRUI  TSVR Index 30.21 HRUI  TPVR/TSVR Ratio 0.13  Cardiac TAVR CT  TECHNIQUE:  The patient was scanned on a [employer]. A 120 kV  retrospective scan was triggered in the descending thoracic aorta at  111 HU's. Gantry rotation speed was 250 msecs and collimation was .6  mm. No beta blockade or nitro were given. The 3D data set was  reconstructed in 5% intervals of the R-R cycle. Systolic and  diastolic phases were analyzed on a dedicated work station using  MPR, MIP and VRT modes. The patient received 80 cc of contrast.  FINDINGS:  Aortic Valve: Trileaflet aortic valve with severely thickened and  moderately calcified  leaflets with no calcifications extending into  the LVOT.  Aorta: Normal size with moderate diffuse atherosclerotic plaque and  calcifications.  Sinotubular Junction: 25.9 x 25.3 mm  Ascending Thoracic Aorta: 33.8 x 32.8 mm  Aortic Arch: 24.5 x 22.7 mm  Descending Thoracic Aorta: 20.0 x 19.2 mm  Sinus of Valsalva Measurements:  Non-coronary: 32 mm  Right -coronary: 29 mm  Left -coronary: 31 mm  Coronary Artery Height above Annulus:  Left Main: 12 mm  Right Coronary: 14 mm  Virtual Basal Annulus Measurements:  Maximum/Minimum Diameter: 25.0 x 20.9 mm  Mean Diameter: 22.6 mm  Perimeter: 72  mm  Area: 401 mm2  Optimum Fluoroscopic Angle for Delivery: LAO 7 CAU 7  IMPRESSION:  1. Trileaflet aortic valve with severely thickened and moderately  calcified leaflets with no calcifications extending into the LVOT.  Annular measurements suitable for delivery of a 23 mm Edwards-SAPIEN  3 valve. Aortic valve calcium score 1930 consistent with severe  aortic stenosis.  2. Sufficient coronary to annulus distance.  3. Optimum Fluoroscopic Angle for Delivery: LAO 7 CAU 7.  4. No thrombus in the left atrial appendage.  Electronically Signed  By: Tobias Alexander  On: 05/22/2019 22:06  CT ANGIOGRAPHY CHEST, ABDOMEN AND PELVIS  TECHNIQUE:  Multidetector CT imaging through the chest, abdomen and pelvis was  performed using the standard protocol during bolus administration of  intravenous contrast. Multiplanar reconstructed images and MIPs were  obtained and reviewed to evaluate the vascular anatomy.  CONTRAST: OMNIPAQUE IOHEXOL 350 MG/ML SOLN  COMPARISON: No priors.  FINDINGS:  CTA CHEST FINDINGS  Cardiovascular: Heart size is borderline enlarged. There is no  significant pericardial fluid, thickening or pericardial  calcification. There is aortic atherosclerosis, as well as  atherosclerosis of the great vessels of the mediastinum and the  coronary arteries, including calcified atherosclerotic plaque in the  left main, left anterior descending, left circumflex and right  coronary arteries. Severe thickening calcification of the aortic  valve. Moderate calcifications of the mitral annulus.  Mediastinum/Lymph Nodes: No pathologically enlarged mediastinal or  hilar lymph nodes. Small hiatal hernia. No axillary lymphadenopathy.  Lungs/Pleura: No suspicious appearing pulmonary nodules or masses.  No acute consolidative airspace disease. No pleural effusions.  Musculoskeletal/Soft Tissues: There are no aggressive appearing  lytic or blastic lesions noted in the visualized portions of  the  skeleton.  CTA ABDOMEN AND PELVIS FINDINGS  Hepatobiliary: No suspicious cystic or solid hepatic lesions. No  intra or extrahepatic biliary ductal dilatation. Gallbladder is  normal in appearance.  Pancreas: No pancreatic mass. No pancreatic ductal dilatation. No  pancreatic or peripancreatic fluid or inflammatory changes.  Spleen: Unremarkable.  Adrenals/Urinary Tract: Multiple low-attenuation lesions in both  kidneys, compatible with simple cysts, largest of which measure up  to 2.6 cm in the posterior aspect of the upper pole the left kidney.  Multifocal cortical thinning in both kidneys, presumably post  infectious or inflammatory scarring. Bilateral adrenal glands are  normal in appearance. There is no hydroureteronephrosis. Urinary  bladder is normal in appearance.  Stomach/Bowel: Normal appearance of the stomach. No pathologic  dilatation of small bowel or colon. A few scattered colonic  diverticulae are noted, without surrounding inflammatory changes to  suggest an acute diverticulitis at this time. Normal appendix.  Vascular/Lymphatic: Aortic atherosclerosis, without evidence of  aneurysm or dissection in the abdominal or pelvic vasculature.  Vascular findings and measurements pertinent to potential TAVR  procedure, as detailed below. No lymphadenopathy noted in the  abdomen or  pelvis.  Reproductive: Uterus and ovaries are unremarkable in appearance.  Other: No significant volume of ascites. No pneumoperitoneum.  Musculoskeletal: Chronic appearing compression fractures of L1, L2  and L3, most severe at L3 where there is up to 90% loss of central  vertebral body height. Old healed fractures of the right superior  and inferior pubic rami. There are no aggressive appearing lytic or  blastic lesions noted in the visualized portions of the skeleton.  VASCULAR MEASUREMENTS PERTINENT TO TAVR:  AORTA:  Minimal Aortic Diameter-11 x 12 mm  Severity of Aortic  Calcification-severe  RIGHT PELVIS:  Right Common Iliac Artery -  Minimal Diameter-7.3 x 6.9 mm  Tortuosity-mild  Calcification-mild  Right External Iliac Artery -  Minimal Diameter-5.8 x 6.1 mm  Tortuosity-severe  Calcification-none  Right Common Femoral Artery -  Minimal Diameter-5.8 x 4.5 mm  Tortuosity-mild  Calcification-mild  LEFT PELVIS:  Left Common Iliac Artery -  Minimal Diameter-8.0 x 8.5 mm  Tortuosity-mild  Calcification-none  Left External Iliac Artery -  Minimal Diameter-6.1 x 6.2 mm  Tortuosity-severe  Calcification-none  Left Common Femoral Artery -  Minimal Diameter-5.5 x 5.5 mm  Tortuosity-mild  Calcification-moderate  Review of the MIP images confirms the above findings.  IMPRESSION:  1. Vascular findings and measurements pertinent to potential TAVR  procedure, as detailed above.  2. Severe thickening and calcification of the aortic valve,  compatible with the reported clinical history of severe aortic  stenosis.  3. Aortic atherosclerosis, in addition to left main and 3 vessel  coronary artery disease.  4. Colonic diverticulosis without evidence of acute diverticulitis  at this time.  5. Additional incidental findings, as above.  Electronically Signed  By: Trudie Reedaniel Entrikin M.D.  On: 05/22/2019 13:51  EKG: NSR w/out significant AV conduction delay    Impression:   Patient has stage D severe symptomatic paradoxical low flow, low gradient with preserved ejection fraction aortic stenosis. She presents with progressive symptoms of exertional shortness of breath and fatigue consistent with chronic diastolic congestive heart failure, [place] Heart Association functional class II. I personally reviewed the patient's recent transthoracic echocardiogram, diagnostic cardiac catheterization, and CT angiogram. Echocardiogram reveals classical features of paradoxical low flow, low gradient with preserved ejection fraction aortic stenosis. The aortic valve is  trileaflet with severe thickening, calcification, and restricted leaflet mobility involving all 3 leaflets. Peak velocity across aortic valve measured 3.5 m/s corresponding to mean transvalvular gradient estimated 28 mmHg. Aortic valve area is calculated 0.73 cm and the DVI notably only 0.20 with ejection fraction calculated 66%. The overall size of the aortic valve and aortic root is relatively small. Diagnostic cardiac catheterization confirmed the presence of severe aortic stenosis with mean transvalvular gradient measured 39 mmHg at catheterization and aortic valve area calculated only 0.6 cm. There was nonobstructive coronary artery disease with continued patency of all previously placed stents in all 3 major vascular distributions. I agree the patient would likely benefit from aortic valve replacement. Aortic root enlargement or root replacement would be required to avoid the development of patient prosthesis mismatch. I would be reluctant to consider this elderly patient a candidate for conventional surgery. Cardiac-gated CTA of the heart reveals anatomical characteristics consistent with aortic stenosis suitable for treatment by transcatheter aortic valve replacement without any significant complicating features and CTA of the aorta and iliac vessels demonstrate what appears to be adequate pelvic vascular access to facilitate a transfemoral approach. Baseline EKG reveals sinus rhythm with no significant AV conduction delay.   Plan:  The patient and her daughter were counseled at length regarding treatment alternatives for management of severe symptomatic aortic stenosis. Alternative approaches such as conventional aortic valve replacement, transcatheter aortic valve replacement, and continued medical therapy without intervention were compared and contrasted at length. The risks associated with conventional surgical aortic valve replacement were discussed in detail, as were expectations for  post-operative convalescence, and why I would be reluctant to consider this patient a candidate for conventional surgery. Issues specific to transcatheter aortic valve replacement were discussed including questions about long term valve durability, the potential for paravalvular leak, possible increased risk of need for permanent pacemaker placement, and other technical complications related to the procedure itself. Long-term prognosis with medical therapy was discussed. This discussion was placed in the context of the patient's own specific clinical presentation and past medical history. All of their questions have been addressed.   Following the decision to proceed with transcatheter aortic valve replacement, a discussion has been held regarding what types of management strategies would be attempted intraoperatively in the event of life-threatening complications, including whether or not the patient would be considered a candidate for the use of cardiopulmonary bypass and/or conversion to open sternotomy for attempted surgical intervention. The patient has been advised of a variety of complications that might develop including but not limited to risks of death, stroke, paravalvular leak, aortic dissection or other major vascular complications, aortic annulus rupture, device embolization, cardiac rupture or perforation, mitral regurgitation, acute myocardial infarction, arrhythmia, heart block or bradycardia requiring permanent pacemaker placement, congestive heart failure, respiratory failure, renal failure, pneumonia, infection, other late complications related to structural valve deterioration or migration, or other complications that might ultimately cause a temporary or permanent loss of functional independence or other long term morbidity. The patient provides full informed consent for the procedure as described and all questions were answered.   Alleen BorneBryan K. Shawntavia Saunders, MD

## 2019-06-06 NOTE — Progress Notes (Signed)
Updated pt daughter, Tessie Fass, of patients new room. All questions answered.    Clyde Canterbury, RN

## 2019-06-06 NOTE — Op Note (Signed)
[facility]   MULTIDISCIPLINARY HEART VALVE TEAM   TAVR OPERATIVE NOTE  Date of Procedure:  06/06/2019  Preoperative Diagnosis: Severe Aortic Stenosis   Postoperative Diagnosis: Same   Procedure:    Transcatheter Aortic Valve Replacement - Percutaneous Transfemoral Approach  Edwards Sapien 3 Ultra THV (size 23 mm, model # 9750TFX, serial # [id number])   Co-Surgeons:  Alleen BorneBryan K. Bartle, MD and Tonny BollmanMichael Jones Viviani, MD  Anesthesiologist:  Adonis Hugueninan Singer, MD  Echocardiographer:  Tobias AlexanderKatarina Nelson, MD  Pre-operative Echo Findings:  severe aortic stenosis  Normal left ventricular systolic function  Post-operative Echo Findings:  No paravalvular leak  Normal left ventricular systolic function  BRIEF CLINICAL NOTE AND INDICATIONS FOR SURGERY  The patient is an 85 year old woman with coronary artery disease status post multivessel stenting.  She has developed progressive and now severe symptomatic aortic stenosis, stage D disease.  After undergoing extensive multidisciplinary heart team evaluation with cardiac catheterization, CT angiography studies, and formal surgical consultation, we elected to proceed with TAVR for treatment of severe symptomatic aortic stenosis.  During the course of the patient's preoperative work up they have been evaluated comprehensively by a multidisciplinary team of specialists coordinated through the [facility].  They have been demonstrated to suffer from symptomatic severe aortic stenosis as noted above. The patient has been counseled extensively as to the relative risks and benefits of all options for the treatment of severe aortic stenosis including long term medical therapy, conventional surgery for aortic valve replacement, and transcatheter aortic valve replacement.     Based upon review of all of the patient's preoperative diagnostic tests they are felt to be candidate for  transcatheter aortic valve replacement using the transfemoral approach as an alternative to high risk conventional surgery.    Following the decision to proceed with transcatheter aortic valve replacement, a discussion has been held regarding what types of management strategies would be attempted intraoperatively in the event of life-threatening complications, including whether or not the patient would be considered a candidate for the use of cardiopulmonary bypass and/or conversion to open sternotomy for attempted surgical intervention.  The patient has been advised of a variety of complications that might develop peculiar to this approach including but not limited to risks of death, stroke, paravalvular leak, aortic dissection or other major vascular complications, aortic annulus rupture, device embolization, cardiac rupture or perforation, acute myocardial infarction, arrhythmia, heart block or bradycardia requiring permanent pacemaker placement, congestive heart failure, respiratory failure, renal failure, pneumonia, infection, other late complications related to structural valve deterioration or migration, or other complications that might ultimately cause a temporary or permanent loss of functional independence or other long term morbidity.  The patient provides full informed consent for the procedure as described and all questions were answered preoperatively.  DETAILS OF THE OPERATIVE PROCEDURE  PREPARATION:   The patient is brought to the operating room on the above mentioned date and central monitoring was established by the anesthesia team including placement of a central venous catheter and radial arterial line. The patient is placed in the supine position on the operating table.  Intravenous antibiotics are administered. The patient is monitored closely throughout the procedure under conscious sedation.  Baseline transthoracic echocardiogram is performed. The patient's chest, abdomen, both  groins, and both lower extremities are prepared and draped in a sterile manner. A time out procedure is performed.   PERIPHERAL ACCESS:   Using ultrasound guidance, femoral arterial and venous access  is obtained with placement of 6 Fr sheaths on the left side.  A pigtail diagnostic catheter was passed through the femoral arterial sheath under fluoroscopic guidance into the aortic root.  A temporary transvenous pacemaker catheter was passed through the femoral venous sheath under fluoroscopic guidance into the right ventricle.  The pacemaker was tested to ensure stable lead placement and pacemaker capture. Aortic root angiography was performed in order to determine the optimal angiographic angle for valve deployment.  TRANSFEMORAL ACCESS:  A micropuncture technique is used to access the right femoral artery under fluoroscopic and ultrasound guidance.  2 Perclose devices are deployed at 10' and 2' positions to 'PreClose' the femoral artery. An 8 French sheath is placed and then an Amplatz Superstiff wire is advanced through the sheath. This is changed out for a 14 French transfemoral E-Sheath after progressively dilating over the Superstiff wire.  An AL-1 catheter was used to direct a straight-tip exchange length wire across the native aortic valve into the left ventricle. This was exchanged out for a pigtail catheter and position was confirmed in the LV apex. Simultaneous LV and Ao pressures were recorded.  The pigtail catheter was exchanged for an Amplatz Extra-stiff wire in the LV apex.    BALLOON AORTIC VALVULOPLASTY:  Not performed  TRANSCATHETER HEART VALVE DEPLOYMENT:  An Edwards Sapien 3 transcatheter heart valve (size 23 mm, model #9750 TFX, serial #[id number]) was prepared and crimped per manufacturer's guidelines, and the proper orientation of the valve is confirmed on the [employer] delivery system. The valve was advanced through the introducer sheath using normal technique until in an  appropriate position in the abdominal aorta beyond the sheath tip. The balloon was then retracted and using the fine-tuning wheel was centered on the valve. The valve was then advanced across the aortic arch using appropriate flexion of the catheter. The valve was carefully positioned across the aortic valve annulus. The Commander catheter was retracted using normal technique. Once final position of the valve has been confirmed by angiographic assessment, the valve is deployed while temporarily holding ventilation and during rapid ventricular pacing to maintain systolic blood pressure < 50 mmHg and pulse pressure < 10 mmHg. The balloon inflation is held for >3 seconds after reaching full deployment volume. Once the balloon has fully deflated the balloon is retracted into the ascending aorta and valve function is assessed using echocardiography. There is felt to be no paravalvular leak and no central aortic insufficiency.  The patient's hemodynamic recovery following valve deployment is good.  The deployment balloon and guidewire are both removed. Echo demostrated acceptable post-procedural gradients, stable mitral valve function, and no aortic insufficiency.    PROCEDURE COMPLETION:  The sheath was removed and femoral artery closure is performed using the 2 previously deployed Perclose devices.  Protamine is administered once femoral arterial repair was complete. The site is clear with no evidence of bleeding or hematoma after the sutures are tightened. The temporary pacemaker, pigtail catheters and femoral sheaths were removed with manual pressure used for hemostasis.   The patient tolerated the procedure well and is transported to the surgical intensive care in stable condition. There were no immediate intraoperative complications. All sponge instrument and needle counts are verified correct at completion of the operation.   The patient received a total of 40 mL of intravenous contrast during the  procedure.   Sherren Mocha, MD 06/06/2019 3:49 PM

## 2019-06-06 NOTE — Op Note (Signed)
[facility]   MULTIDISCIPLINARY HEART VALVE TEAM   TAVR OPERATIVE NOTE   Date of Procedure:  06/06/2019  Preoperative Diagnosis: Severe Aortic Stenosis   Postoperative Diagnosis: Same   Procedure:    Transcatheter Aortic Valve Replacement - Percutaneous Right Transfemoral Approach  Edwards Sapien 3 UltraTHV (size 23 mm, model # 9750TFX, serial # [id number])   Co-Surgeons:  Alleen BorneBryan K. Naketa Daddario, MD and Tonny BollmanMichael Cooper, MD   Anesthesiologist:  Adonis Hugueninan Singer, MD  Echocardiographer:  Tobias AlexanderKatarina Nelson, MD  Pre-operative Echo Findings:  Severe aortic stenosis  Moderate AI  Normal left ventricular systolic function  Post-operative Echo Findings:  No paravalvular leak  Normal left ventricular systolic function   BRIEF CLINICAL NOTE AND INDICATIONS FOR SURGERY  Patient has stage D severe symptomatic paradoxical low flow, low gradient with preserved ejection fraction aortic stenosis. She presents with progressive symptoms of exertional shortness of breath and fatigue consistent with chronic diastolic congestive heart failure, [place] Heart Association functional class II. I personally reviewed the patient's recent transthoracic echocardiogram, diagnostic cardiac catheterization, and CT angiogram. Echocardiogram reveals classical features of paradoxical low flow, low gradient with preserved ejection fraction aortic stenosis. The aortic valve is trileaflet with severe thickening, calcification, and restricted leaflet mobility involving all 3 leaflets. Peak velocity across aortic valve measured 3.5 m/s corresponding to mean transvalvular gradient estimated 28 mmHg. Aortic valve area is calculated 0.73 cm and the DVI notably only 0.20 with ejection fraction calculated 66%. The overall size of the aortic valve and aortic root is relatively small. Diagnostic cardiac catheterization confirmed the presence of severe aortic stenosis with mean transvalvular gradient measured 39 mmHg at  catheterization and aortic valve area calculated only 0.6 cm. There was nonobstructive coronary artery disease with continued patency of all previously placed stents in all 3 major vascular distributions. I agree the patient would likely benefit from aortic valve replacement. Aortic root enlargement or root replacement would be required to avoid the development of patient prosthesis mismatch. I would be reluctant to consider this elderly patient a candidate for conventional surgery. Cardiac-gated CTA of the heart reveals anatomical characteristics consistent with aortic stenosis suitable for treatment by transcatheter aortic valve replacement without any significant complicating features and CTA of the aorta and iliac vessels demonstrate what appears to be adequate pelvic vascular access to facilitate a transfemoral approach. Baseline EKG reveals sinus rhythm with no significant AV conduction delay.   The patient and her daughter were counseled at length regarding treatment alternatives for management of severe symptomatic aortic stenosis. Alternative approaches such as conventional aortic valve replacement, transcatheter aortic valve replacement, and continued medical therapy without intervention were compared and contrasted at length. The risks associated with conventional surgical aortic valve replacement were discussed in detail, as were expectations for post-operative convalescence, and why I would be reluctant to consider this patient a candidate for conventional surgery. Issues specific to transcatheter aortic valve replacement were discussed including questions about long term valve durability, the potential for paravalvular leak, possible increased risk of need for permanent pacemaker placement, and other technical complications related to the procedure itself. Long-term prognosis with medical therapy was discussed. This discussion was placed in the context of the patient's own specific clinical  presentation and past medical history. All of their questions have been addressed.   Following the decision to proceed with transcatheter aortic valve replacement, a discussion has been held regarding what types of management strategies would be attempted intraoperatively in the event of life-threatening complications, including whether or  not the patient would be considered a candidate for the use of cardiopulmonary bypass and/or conversion to open sternotomy for attempted surgical intervention. The patient has been advised of a variety of complications that might develop including but not limited to risks of death, stroke, paravalvular leak, aortic dissection or other major vascular complications, aortic annulus rupture, device embolization, cardiac rupture or perforation, mitral regurgitation, acute myocardial infarction, arrhythmia, heart block or bradycardia requiring permanent pacemaker placement, congestive heart failure, respiratory failure, renal failure, pneumonia, infection, other late complications related to structural valve deterioration or migration, or other complications that might ultimately cause a temporary or permanent loss of functional independence or other long term morbidity. The patient provides full informed consent for the procedure as described and all questions were answered.    DETAILS OF THE OPERATIVE PROCEDURE  PREPARATION:    The patient is brought to the operating room on the above mentioned date and appropriate monitoring was established by the anesthesia team. The patient is placed in the supine position on the operating table.  Intravenous antibiotics are administered. The patient is monitored closely throughout the procedure under conscious sedation.    Baseline transthoracic echocardiogram was performed. The patient's chest, abdomen, both groins, and both lower extremities are prepared and draped in a sterile manner. A time out procedure is  performed.   PERIPHERAL ACCESS:    Using the modified Seldinger technique, femoral arterial and venous access was obtained with placement of 6 Fr sheaths on the left side.  A pigtail diagnostic catheter was passed through the left arterial sheath under fluoroscopic guidance into the aortic root.  A temporary transvenous pacemaker catheter was passed through the left femoral venous sheath under fluoroscopic guidance into the right ventricle.  The pacemaker was tested to ensure stable lead placement and pacemaker capture. Aortic root angiography was performed in order to determine the optimal angiographic angle for valve deployment.   TRANSFEMORAL ACCESS:   Percutaneous transfemoral access and sheath placement was performed using ultrasound guidance.  The right common femoral artery was cannulated using a micropuncture needle and appropriate location was verified using hand injection angiogram.  A pair of Abbott Perclose percutaneous closure devices were placed and a 6 French sheath replaced into the femoral artery.  The patient was heparinized systemically and ACT verified > 250 seconds.    A 14 Fr transfemoral E-sheath was introduced into the right common femoral artery after progressively dilating over an Amplatz superstiff wire. An AL-1 catheter was used to direct a straight-tip exchange length wire across the native aortic valve into the left ventricle. This was exchanged out for a pigtail catheter and position was confirmed in the LV apex. Simultaneous LV and Ao pressures were recorded.  The pigtail catheter was exchanged for an Amplatz Extra-stiff wire in the LV apex.    BALLOON AORTIC VALVULOPLASTY:   Not performed  TRANSCATHETER HEART VALVE DEPLOYMENT:   An Edwards Sapien 3 Ultra transcatheter heart valve (size 23 mm, model #9750TFX, serial #[id number]) was prepared and crimped per manufacturer's guidelines, and the proper orientation of the valve is confirmed on the [employer] Commander  delivery system. The valve was advanced through the introducer sheath using normal technique until in an appropriate position in the abdominal aorta beyond the sheath tip. The balloon was then retracted and using the fine-tuning wheel was centered on the valve. The valve was then advanced across the aortic arch using appropriate flexion of the catheter. The valve was carefully positioned across the aortic valve annulus. The  Commander catheter was retracted using normal technique. Once final position of the valve has been confirmed by angiographic assessment, the valve is deployed while temporarily holding ventilation and during rapid ventricular pacing to maintain systolic blood pressure < 50 mmHg and pulse pressure < 10 mmHg. The balloon inflation is held for >3 seconds after reaching full deployment volume. Once the balloon has fully deflated the balloon is retracted into the ascending aorta and valve function is assessed using echocardiography. There is felt to be no paravalvular leak and no central aortic insufficiency.  The patient's hemodynamic recovery following valve deployment is good.  The deployment balloon and guidewire are both removed.    PROCEDURE COMPLETION:   The sheath was removed and femoral artery closure performed.  Protamine was administered once femoral arterial repair was complete. The temporary pacemaker, pigtail catheters and femoral sheaths were removed with manual pressure used for hemostasis.  A Mynx femoral closure device was utilized following removal of the diagnostic sheath in the left femoral artery.  The patient tolerated the procedure well and is transported to the surgical intensive care in stable condition. There were no immediate intraoperative complications. All sponge instrument and needle counts are verified correct at completion of the operation.   No blood products were administered during the operation.  The patient received a total of 40 mL of intravenous  contrast during the procedure.   Gaye Pollack, MD 06/06/2019 3:23 PM

## 2019-06-06 NOTE — Progress Notes (Signed)
  Echocardiogram 2D Echocardiogram has been performed.  Catherine Anthony 06/06/2019, 3:06 PM

## 2019-06-06 NOTE — Interval H&P Note (Signed)
History and Physical Interval Note:  06/06/2019 1:20 PM  Catherine Anthony  has presented today for surgery, with the diagnosis of Severe Aortic Stenosis.  The various methods of treatment have been discussed with the patient and family. After consideration of risks, benefits and other options for treatment, the patient has consented to  Procedure(s): TRANSCATHETER AORTIC VALVE REPLACEMENT, TRANSFEMORAL (N/A) TRANSESOPHAGEAL ECHOCARDIOGRAM (TEE) (N/A) as a surgical intervention.  The patient's history has been reviewed, patient examined, no change in status, stable for surgery.  I have reviewed the patient's chart and labs.  Questions were answered to the patient's satisfaction.     Shortness of breath: Yes.   If yes: with what activity?: moderate exertion Worse than previously noted?: Yes.    New edema, PND, orthopnea: No.  Recent decrease in activity i.e. more difficulty walking to mailbox, climbing stairs, etc: No.  Changes in sleeping i.e. need to utilize to sleep on more pillows, sitting up, etc: No.  Changes since last seen in pre-op visit: No.   Gaye Pollack

## 2019-06-07 ENCOUNTER — Encounter (HOSPITAL_COMMUNITY): Payer: Self-pay | Admitting: Cardiovascular Disease

## 2019-06-07 ENCOUNTER — Other Ambulatory Visit: Payer: Self-pay

## 2019-06-07 ENCOUNTER — Inpatient Hospital Stay (HOSPITAL_COMMUNITY): Payer: Medicare Other

## 2019-06-07 DIAGNOSIS — I350 Nonrheumatic aortic (valve) stenosis: Secondary | ICD-10-CM

## 2019-06-07 DIAGNOSIS — Z952 Presence of prosthetic heart valve: Secondary | ICD-10-CM

## 2019-06-07 LAB — CBC
HCT: 32.3 % — ABNORMAL LOW (ref 36.0–46.0)
Hemoglobin: 11.1 g/dL — ABNORMAL LOW (ref 12.0–15.0)
MCH: 33.3 pg (ref 26.0–34.0)
MCHC: 34.4 g/dL (ref 30.0–36.0)
MCV: 97 fL (ref 80.0–100.0)
Platelets: 193 10*3/uL (ref 150–400)
RBC: 3.33 MIL/uL — ABNORMAL LOW (ref 3.87–5.11)
RDW: 12.1 % (ref 11.5–15.5)
WBC: 7.3 10*3/uL (ref 4.0–10.5)
nRBC: 0 % (ref 0.0–0.2)

## 2019-06-07 LAB — POCT ACTIVATED CLOTTING TIME
Activated Clotting Time: 125 seconds
Activated Clotting Time: 103 seconds
Activated Clotting Time: 208 seconds
Activated Clotting Time: 246 seconds

## 2019-06-07 LAB — BASIC METABOLIC PANEL
Anion gap: 5 (ref 5–15)
BUN: 19 mg/dL (ref 8–23)
CO2: 22 mmol/L (ref 22–32)
Calcium: 8.4 mg/dL — ABNORMAL LOW (ref 8.9–10.3)
Chloride: 109 mmol/L (ref 98–111)
Creatinine, Ser: 1.05 mg/dL — ABNORMAL HIGH (ref 0.44–1.00)
GFR calc Af Amer: 56 mL/min — ABNORMAL LOW (ref >60)
GFR calc non Af Amer: 48 mL/min — ABNORMAL LOW (ref >60)
Glucose, Bld: 93 mg/dL (ref 70–99)
Potassium: 4.2 mmol/L (ref 3.5–5.1)
Sodium: 136 mmol/L (ref 135–145)

## 2019-06-07 LAB — ECHOCARDIOGRAM COMPLETE
Height: 64 in
Weight: 1812.8 oz

## 2019-06-07 LAB — MAGNESIUM: Magnesium: 1.7 mg/dL (ref 1.7–2.4)

## 2019-06-07 MED ORDER — ASPIRIN 81 MG PO CHEW: 81.0000 mg | CHEWABLE_TABLET | Freq: Every day | ORAL | Status: DC

## 2019-06-07 MED ORDER — ATENOLOL 100 MG PO TABS: 50.0000 mg | ORAL_TABLET | Freq: Every day | ORAL | Status: DC

## 2019-06-07 NOTE — Progress Notes (Signed)
CARDIAC REHAB PHASE I   PRE:  Rate/Rhythm: 81 SR with PACs    BP: sitting 122/69    SaO2: 93 RA  MODE:  Ambulation: 340 ft   POST:  Rate/Rhythm: 120 ST with PACs/PVCs    BP: sitting 137/74     SaO2:   Pt eager to walk even though she walked earlier. Fairly steady. Sts her left leg is somewhat painful at incision, asked for tylenol. Increased ectopy walking but this seems to be her norm. Discussed restrictions and walking at home. She is not interested in CRPII.  [place], ACSM 06/07/2019 9:39 AM

## 2019-06-07 NOTE — Progress Notes (Signed)
Patient was discharged today and picked up by daughter.  Both IVs were removed and patient was taken off telemetry.  Patient left with all of their personal belongings.  All AVS documentation was reviewed with patient and all questions were answered.

## 2019-06-07 NOTE — Discharge Instructions (Signed)

## 2019-06-07 NOTE — Discharge Summary (Addendum)
[facility]   MULTIDISCIPLINARY HEART VALVE TEAM  Discharge Summary    Patient ID: Catherine Anthony MRN: [id number]; DOB: 05-22-1930  Admit date: 06/06/2019 Discharge date: 06/07/2019  Primary Care Provider: [name], MD  Primary Cardiologist: Dr. Melvenia Needles / Dr. Excell Seltzer & Dr. Laneta Simmers (TAVR)  Discharge Diagnoses    Principal Problem:   S/P TAVR (transcatheter aortic valve replacement) Active Problems:   Severe aortic stenosis   Hypertension   High cholesterol   Coronary artery disease   Acute on chronic diastolic heart failure (HCC)   PAF (paroxysmal atrial fibrillation) (HCC)   Allergies Allergies  Allergen Reactions   Codeine Nausea And Vomiting    Diagnostic Studies/Procedures    TAVR OPERATIVE NOTE   Date of Procedure:                06/06/2019  Preoperative Diagnosis:      Severe Aortic Stenosis   Postoperative Diagnosis:    Same   Procedure:        Transcatheter Aortic Valve Replacement - Percutaneous Right Transfemoral Approach             Edwards Sapien 3 UltraTHV (size 23 mm, model # 9750TFX, serial # [id number])              Co-Surgeons:                        Alleen Borne, MD and Tonny Bollman, MD   Anesthesiologist:                  Adonis Huguenin, MD  Echocardiographer:              Tobias Alexander, MD  Pre-operative Echo Findings: ? Severe aortic stenosis ? Moderate AI ? Normal left ventricular systolic function  Post-operative Echo Findings: ? No paravalvular leak ? Normal left ventricular systolic function  _____________   Echo 06/27/19: pending at time of the discharge    History of Present Illness     Catherine Anthony is a 85 y.o. female with a history of HTN, HLD, severe multivessel CAD s/p multivessel PCI in staged fashion, treated with 6 DES (1 in RCA, 2 in LAD, and 3 in LCx), PAF on Eliquis and severe paradoxical LFLG AS who presented to [facility] on 06/06/19 for planned TAVR.   Patient has remained  relatively active physically and functionally independent throughout her adult life.  Several years ago she began to develop decreased exercise tolerance with exertional shortness of breath and increasing fatigue.  She was evaluated by Dr. Rosemary Holms and found to have moderate aortic stenosis and severe multivessel coronary artery disease.  She underwent multivessel PCI in staged procedures including a total of 6 drug-eluting stents (1 in RCA, 2 in LAD, 3 in LCx).  She states that she thinks she may have enjoyed some improvement after this, but over the past 2 years she has continued to experience decreased energy, loss of appetite, and exertional shortness of breath.  Follow-up transthoracic echocardiogram revealed findings consistent with severe paroxysmal low flow, low gradient with preserved left ventricular ejection fraction aortic stenosis.  Left ventricular ejection fraction was estimated 66%.  Peak velocity across aortic valve measured greater than 3.4 m/s corresponding to mean transvalvular gradient estimated 28 mmHg.  The DVI was reported 0.20 with aortic valve area calculated 0.73 cm.  She underwent diagnostic cardiac catheterization on May 16, 2019.  Mean transvalvular gradient was measured 39 mmHg at the  time of catheterization, corresponding to aortic valve area calculated 0.6 cm.  Coronary angiography revealed continued patency of all previously placed stents with no significant obstructive disease.   The patient has been evaluated by the multidisciplinary valve team and felt to have severe, symptomatic aortic stenosis and to be a suitable candidate for TAVR, which was set up for 06/06/19.    Hospital Course     Consultants: none  Severe AS: s/p successful TAVR with a 23 mm Edwards Sapien 3 Ultra THV via the TF approach on 06/06/19. Post operative echo pending. Groin sites are stable. ECG with sinus and no high grade heart block. Continue Eliquis and add baby aspirin 81mg  x 6months.   HTN:  BP has been in good range off all meds while admitted. Home antihypertensive regimen includes Norvasc 10mg  daily, atenolol 100mg  daily, Lisinopril 40mg  daily and Spiro 25mg  daily. Will plan to decrease atenolol to 50mg  daily and stop amlodipine 10mg  daily. I will resume all other home medications. Will add back medications next week if BP becomes elevated.   Acute on chronic diastolic CHF: as evidenced by an elevated BNP on preadmission lab work. This has been treated with TAVR. Plan to resume home spiro.   CAD: pre TAVR cath showed continued patency of all previously placed stents with no significant obstructive disease.   PAF: resume home Eliquis tonight. Tele shows posible afib vs sinus with frequent PACs. ECG shows sinus rhythm.   Absent right foot pulses: the patient was complaining of mild pain in her right foot in the early post op period.  Prior to the patient's procedure, there is documentation of a palpable right DP pulse. Dr. Excell Seltzerooper and I were unable to palpate or doppler a right foot pulse. The patient does have a palpable femoral pulse that feels strong even distal to the arteriotomy. We ordered a stat arterial Doppler study which demonstrated wide patency of the common femoral and superficial femoral arteries. The below-knee popliteal had a dampened waveform in the tibial vessels had very blunted waveforms present. By the time the study was completed, the patient's foot pain had completely resolved and she has no motor deficit. She has been up walking with cardiac rehab today with no symptoms.  _____________  Discharge Vitals Blood pressure 122/69, pulse 77, temperature 98.2 F (36.8 C), temperature source Oral, resp. rate 19, height 5\' 4"  (1.626 m), weight 51.4 kg, SpO2 96 %.  Filed Weights   06/06/19 1010 06/07/19 0308  Weight: 49.9 kg 51.4 kg     GEN: Well nourished, well developed, in no acute distress HEENT: normal Neck: no JVD or masses Cardiac: RRR; no murmurs, rubs, or  gallops,no edema  Respiratory:  clear to auscultation bilaterally, normal work of breathing GI: soft, nontender, nondistended, + BS MS: no deformity or atrophy Skin: warm and dry, no rash.  Groin sites clear without hematoma or ecchymosis. Left 2+ DP. Not able to palpate a pulse on right foot. Both extremities warm with good color Neuro:  Alert and Oriented x 3, Strength and sensation are intact Psych: euthymic mood, full affect    Labs & Radiologic Studies    CBC Recent Labs    06/06/19 1527 06/07/19 0442  WBC  --  7.3  HGB 9.5* 11.1*  HCT 28.0* 32.3*  MCV  --  97.0  PLT  --  193   Basic Metabolic Panel Recent Labs    40/98/1106/06/18 1527 06/07/19 0442  NA 139 136  K 3.9 4.2  CL  108 109  CO2  --  22  GLUCOSE 111* 93  BUN 28* 19  CREATININE 0.80 1.05*  CALCIUM  --  8.4*  MG  --  1.7   Liver Function Tests No results for input(s): AST, ALT, ALKPHOS, BILITOT, PROT, ALBUMIN in the last 72 hours. No results for input(s): LIPASE, AMYLASE in the last 72 hours. Cardiac Enzymes No results for input(s): CKTOTAL, CKMB, CKMBINDEX, TROPONINI in the last 72 hours. BNP Invalid input(s): POCBNP D-Dimer No results for input(s): DDIMER in the last 72 hours. Hemoglobin A1C No results for input(s): HGBA1C in the last 72 hours. Fasting Lipid Panel No results for input(s): CHOL, HDL, LDLCALC, TRIG, CHOLHDL, LDLDIRECT in the last 72 hours. Thyroid Function Tests No results for input(s): TSH, T4TOTAL, T3FREE, THYROIDAB in the last 72 hours.  Invalid input(s): FREET3 _____________  Dg Chest 2 View  Result Date: 06/01/2019 CLINICAL DATA:  Preop evaluation for upcoming TAVR EXAM: CHEST - 2 VIEW COMPARISON:  07/09/2017, 05/22/2019 FINDINGS: Cardiac shadow is within normal limits. Aortic calcifications are seen. The lungs are well aerated bilaterally. No focal infiltrate or sizable effusion is seen. No acute bony abnormality is seen. Some chronic compression deformities in the lumbar spine are  noted. IMPRESSION: No acute abnormality seen. Electronically Signed   By: Inez Catalina M.D.   On: 06/01/2019 17:14   Ct Coronary Morph W/cta Cor W/score W/ca W/cm &/or Wo/cm  Addendum Date: 05/22/2019   ADDENDUM REPORT: 05/22/2019 22:06 CLINICAL DATA:  86 year old female with severe aortic stenosis being evaluated for a TAVR procedure. EXAM: Cardiac TAVR CT TECHNIQUE: The patient was scanned on a [employer]. A 120 kV retrospective scan was triggered in the descending thoracic aorta at 111 HU's. Gantry rotation speed was 250 msecs and collimation was .6 mm. No beta blockade or nitro were given. The 3D data set was reconstructed in 5% intervals of the R-R cycle. Systolic and diastolic phases were analyzed on a dedicated work station using MPR, MIP and VRT modes. The patient received 80 cc of contrast. FINDINGS: Aortic Valve: Trileaflet aortic valve with severely thickened and moderately calcified leaflets with no calcifications extending into the LVOT. Aorta: Normal size with moderate diffuse atherosclerotic plaque and calcifications. Sinotubular Junction: 25.9 x 25.3 mm Ascending Thoracic Aorta: 33.8 x 32.8 mm Aortic Arch: 24.5 x 22.7 mm Descending Thoracic Aorta: 20.0 x 19.2 mm Sinus of Valsalva Measurements: Non-coronary: 32 mm Right -coronary: 29 mm Left -coronary: 31 mm Coronary Artery Height above Annulus: Left Main: 12 mm Right Coronary: 14 mm Virtual Basal Annulus Measurements: Maximum/Minimum Diameter: 25.0 x 20.9 mm Mean Diameter: 22.6 mm Perimeter: 72 mm Area: 401 mm2 Optimum Fluoroscopic Angle for Delivery: LAO 7 CAU 7 IMPRESSION: 1. Trileaflet aortic valve with severely thickened and moderately calcified leaflets with no calcifications extending into the LVOT. Annular measurements suitable for delivery of a 23 mm Edwards-SAPIEN 3 valve. Aortic valve calcium score 1930 consistent with severe aortic stenosis. 2. Sufficient coronary to annulus distance. 3. Optimum Fluoroscopic Angle for  Delivery: LAO 7 CAU 7. 4. No thrombus in the left atrial appendage. Electronically Signed   By: Ena Dawley   On: 05/22/2019 22:06   Result Date: 05/22/2019 EXAM: OVER-READ INTERPRETATION  CT CHEST The following report is an over-read performed by radiologist Dr. Vinnie Langton of [facility] [place] on 05/22/2019. This over-read does not include interpretation of cardiac or coronary anatomy or pathology. The coronary calcium score/coronary CTA interpretation by the cardiologist is attached. COMPARISON:  None.  FINDINGS: Extracardiac findings will be described separately under dictation for contemporaneously obtained CTA chest, abdomen and pelvis. IMPRESSION: Please see separate dictation for contemporaneously obtained CTA chest, abdomen and pelvis dated 05/22/2019 for full description of relevant extracardiac findings. Electronically Signed: By: Trudie Reed M.D. On: 05/22/2019 12:31   Dg Chest Port 1 View  Result Date: 06/06/2019 CLINICAL DATA:  Status post TAVR EXAM: PORTABLE CHEST 1 VIEW COMPARISON:  Chest x-ray dated June 01, 2019 FINDINGS: The patient is now status post TAVR. The heart size is stable. Aortic calcifications are noted. Coronary artery stents are noted. There is no pneumothorax. No large pleural effusion. The lungs are somewhat hyperexpanded. There is atelectasis at the lung bases. IMPRESSION: 1. Status post TAVR. 2. No acute cardiopulmonary process. Electronically Signed   By: Katherine Mantle M.D.   On: 06/06/2019 18:48   Ct Angio Chest Aorta W &/or Wo Contrast  Result Date: 05/22/2019 CLINICAL DATA:  85 year old female with history of severe aortic stenosis. Preprocedural study prior to potential transcatheter aortic valve replacement (TAVR) procedure. EXAM: CT ANGIOGRAPHY CHEST, ABDOMEN AND PELVIS TECHNIQUE: Multidetector CT imaging through the chest, abdomen and pelvis was performed using the standard protocol during bolus administration of intravenous contrast.  Multiplanar reconstructed images and MIPs were obtained and reviewed to evaluate the vascular anatomy. CONTRAST:  OMNIPAQUE IOHEXOL 350 MG/ML SOLN COMPARISON:  No priors. FINDINGS: CTA CHEST FINDINGS Cardiovascular: Heart size is borderline enlarged. There is no significant pericardial fluid, thickening or pericardial calcification. There is aortic atherosclerosis, as well as atherosclerosis of the great vessels of the mediastinum and the coronary arteries, including calcified atherosclerotic plaque in the left main, left anterior descending, left circumflex and right coronary arteries. Severe thickening calcification of the aortic valve. Moderate calcifications of the mitral annulus. Mediastinum/Lymph Nodes: No pathologically enlarged mediastinal or hilar lymph nodes. Small hiatal hernia. No axillary lymphadenopathy. Lungs/Pleura: No suspicious appearing pulmonary nodules or masses. No acute consolidative airspace disease. No pleural effusions. Musculoskeletal/Soft Tissues: There are no aggressive appearing lytic or blastic lesions noted in the visualized portions of the skeleton. CTA ABDOMEN AND PELVIS FINDINGS Hepatobiliary: No suspicious cystic or solid hepatic lesions. No intra or extrahepatic biliary ductal dilatation. Gallbladder is normal in appearance. Pancreas: No pancreatic mass. No pancreatic ductal dilatation. No pancreatic or peripancreatic fluid or inflammatory changes. Spleen: Unremarkable. Adrenals/Urinary Tract: Multiple low-attenuation lesions in both kidneys, compatible with simple cysts, largest of which measure up to 2.6 cm in the posterior aspect of the upper pole the left kidney. Multifocal cortical thinning in both kidneys, presumably post infectious or inflammatory scarring. Bilateral adrenal glands are normal in appearance. There is no hydroureteronephrosis. Urinary bladder is normal in appearance. Stomach/Bowel: Normal appearance of the stomach. No pathologic dilatation of small  bowel or colon. A few scattered colonic diverticulae are noted, without surrounding inflammatory changes to suggest an acute diverticulitis at this time. Normal appendix. Vascular/Lymphatic: Aortic atherosclerosis, without evidence of aneurysm or dissection in the abdominal or pelvic vasculature. Vascular findings and measurements pertinent to potential TAVR procedure, as detailed below. No lymphadenopathy noted in the abdomen or pelvis. Reproductive: Uterus and ovaries are unremarkable in appearance. Other: No significant volume of ascites.  No pneumoperitoneum. Musculoskeletal: Chronic appearing compression fractures of L1, L2 and L3, most severe at L3 where there is up to 90% loss of central vertebral body height. Old healed fractures of the right superior and inferior pubic rami. There are no aggressive appearing lytic or blastic lesions noted in the visualized portions of  the skeleton. VASCULAR MEASUREMENTS PERTINENT TO TAVR: AORTA: Minimal Aortic Diameter-11 x 12 mm Severity of Aortic Calcification-severe RIGHT PELVIS: Right Common Iliac Artery - Minimal Diameter-7.3 x 6.9 mm Tortuosity-mild Calcification-mild Right External Iliac Artery - Minimal Diameter-5.8 x 6.1 mm Tortuosity-severe Calcification-none Right Common Femoral Artery - Minimal Diameter-5.8 x 4.5 mm Tortuosity-mild Calcification-mild LEFT PELVIS: Left Common Iliac Artery - Minimal Diameter-8.0 x 8.5 mm Tortuosity-mild Calcification-none Left External Iliac Artery - Minimal Diameter-6.1 x 6.2 mm Tortuosity-severe Calcification-none Left Common Femoral Artery - Minimal Diameter-5.5 x 5.5 mm Tortuosity-mild Calcification-moderate Review of the MIP images confirms the above findings. IMPRESSION: 1. Vascular findings and measurements pertinent to potential TAVR procedure, as detailed above. 2. Severe thickening and calcification of the aortic valve, compatible with the reported clinical history of severe aortic stenosis. 3. Aortic atherosclerosis, in  addition to left main and 3 vessel coronary artery disease. 4. Colonic diverticulosis without evidence of acute diverticulitis at this time. 5. Additional incidental findings, as above. Electronically Signed   By: Trudie Reed M.D.   On: 05/22/2019 13:51   Vas US Carotid  Result Date: 05/23/2019 Carotid Arterial Duplex Study Indications:       Pre TAVR. Risk Factors:      Hypertension, hyperlipidemia. Limitations:       depth of vessels/ patient anatomy Comparison Study:  no prior Performing Technologist: Jeb Levering RDMS, RVT  Examination Guidelines: A complete evaluation includes B-mode imaging, spectral Doppler, color Doppler, and power Doppler as needed of all accessible portions of each vessel. Bilateral testing is considered an integral part of a complete examination. Limited examinations for reoccurring indications may be performed as noted.  Right Carotid Findings: +----------+--------+--------+--------+-------------------------+--------+             PSV cm/s EDV cm/s Stenosis Describe                  Comments  +----------+--------+--------+--------+-------------------------+--------+  CCA Prox   47       5                                                     +----------+--------+--------+--------+-------------------------+--------+  CCA Distal 47       11                                                    +----------+--------+--------+--------+-------------------------+--------+  ICA Prox   62       12       1-39%    heterogenous and calcific           +----------+--------+--------+--------+-------------------------+--------+  ICA Distal 41       12                                                    +----------+--------+--------+--------+-------------------------+--------+  ECA        70       11                                                    +----------+--------+--------+--------+-------------------------+--------+ +----------+--------+-------+----------------+-------------------+  PSV cm/s EDV cms Describe         Arm Pressure (mmHG)  +----------+--------+-------+----------------+-------------------+  Subclavian 51               Multiphasic, WNL                      +----------+--------+-------+----------------+-------------------+ +---------+--------+--+--------+-+---------+  Vertebral PSV cm/s 39 EDV cm/s 6 Antegrade  +---------+--------+--+--------+-+---------+  Left Carotid Findings: +----------+--------+--------+--------+------------+--------+             PSV cm/s EDV cm/s Stenosis Describe     Comments  +----------+--------+--------+--------+------------+--------+  CCA Prox   60       6                                        +----------+--------+--------+--------+------------+--------+  CCA Distal 67       14                                       +----------+--------+--------+--------+------------+--------+  ICA Prox   63       16       1-39%    heterogenous           +----------+--------+--------+--------+------------+--------+  ICA Distal 41       13                                       +----------+--------+--------+--------+------------+--------+  ECA        75       8                                        +----------+--------+--------+--------+------------+--------+ +----------+--------+--------+--------+-------------------+  Subclavian PSV cm/s EDV cm/s Describe Arm Pressure (mmHG)  +----------+--------+--------+--------+-------------------+             226               Stenotic                      +----------+--------+--------+--------+-------------------+ +---------+--------+--+--------+--------+  Vertebral PSV cm/s 58 EDV cm/s Atypical  +---------+--------+--+--------+--------+  Summary: Right Carotid: Velocities in the right ICA are consistent with a 1-39% stenosis. Left Carotid: Velocities in the left ICA are consistent with a 1-39% stenosis. Vertebrals:  Right vertebral artery demonstrates antegrade flow. Left vertebral              demonstrates atypical waveforms.  Subclavians: Left subclavian artery was stenotic. Normal flow hemodynamics were              seen in the right subclavian artery. *See table(s) above for measurements and observations.  Electronically signed by Waverly Ferrari MD on 05/23/2019 at 11:43:57 AM.    Final    Vas [place] Lower Extremity Arterial Duplex  Result Date: 06/07/2019 LOWER EXTREMITY ARTERIAL DUPLEX STUDY Indications: Leg pain after transfemoral TAVR.  Vascular Interventions: TRANSCATHETER AORTIC VALVE REPLACEMENT, TRANSFEMORAL. Current ABI: Comparison Study: No prior studies. Performing Technologist: Chanda Busing RVT  Examination Guidelines: A complete evaluation includes B-mode imaging, spectral Doppler, color Doppler, and power Doppler as needed of all accessible portions of each vessel. Bilateral testing is considered  an integral part of a complete examination. Limited examinations for reoccurring indications may be performed as noted.  +----------+--------+-----+--------+----------+------------------+  RIGHT      PSV cm/s Ratio Stenosis Waveform   Comments            +----------+--------+-----+--------+----------+------------------+  CFA Prox   112                     triphasic                      +----------+--------+-----+--------+----------+------------------+  SFA Prox   137                     triphasic                      +----------+--------+-----+--------+----------+------------------+  SFA Mid    47                      biphasic                       +----------+--------+-----+--------+----------+------------------+  SFA Distal 37                      biphasic                       +----------+--------+-----+--------+----------+------------------+  POP Mid    96                      biphasic                       +----------+--------+-----+--------+----------+------------------+  ATA Mid    7                       monophasic                     +----------+--------+-----+--------+----------+------------------+  PTA Prox   32                       monophasic                     +----------+--------+-----+--------+----------+------------------+  PTA Mid                                       Unable to insonate  +----------+--------+-----+--------+----------+------------------+  PERO Prox  23                      biphasic                       +----------+--------+-----+--------+----------+------------------+  PERO Mid   7                       monophasic                     +----------+--------+-----+--------+----------+------------------+  Summary: Right: Patent common femoral, superficial femoral, and popliteal arteries. Monophasic waveforms are detected distal to the tibioperoneal trunk.  See table(s) above for measurements and observations. Electronically signed by Waverly Ferrarihristopher Dickson MD on 06/07/2019 at 6:34:11 AM.    Final    Ct Angio Abdomen Pelvis  W &/or Wo Contrast  Result Date: 05/22/2019 CLINICAL DATA:  85 year old female with history of severe aortic stenosis. Preprocedural study prior to potential transcatheter aortic valve replacement (TAVR) procedure. EXAM: CT ANGIOGRAPHY CHEST, ABDOMEN AND PELVIS TECHNIQUE: Multidetector CT imaging through the chest, abdomen and pelvis was performed using the standard protocol during bolus administration of intravenous contrast. Multiplanar reconstructed images and MIPs were obtained and reviewed to evaluate the vascular anatomy. CONTRAST:  100mL OMNIPAQUE IOHEXOL 350 MG/ML SOLN COMPARISON:  No priors. FINDINGS: CTA CHEST FINDINGS Cardiovascular: Heart size is borderline enlarged. There is no significant pericardial fluid, thickening or pericardial calcification. There is aortic atherosclerosis, as well as atherosclerosis of the great vessels of the mediastinum and the coronary arteries, including calcified atherosclerotic plaque in the left main, left anterior descending, left circumflex and right coronary arteries. Severe thickening calcification of the aortic valve. Moderate  calcifications of the mitral annulus. Mediastinum/Lymph Nodes: No pathologically enlarged mediastinal or hilar lymph nodes. Small hiatal hernia. No axillary lymphadenopathy. Lungs/Pleura: No suspicious appearing pulmonary nodules or masses. No acute consolidative airspace disease. No pleural effusions. Musculoskeletal/Soft Tissues: There are no aggressive appearing lytic or blastic lesions noted in the visualized portions of the skeleton. CTA ABDOMEN AND PELVIS FINDINGS Hepatobiliary: No suspicious cystic or solid hepatic lesions. No intra or extrahepatic biliary ductal dilatation. Gallbladder is normal in appearance. Pancreas: No pancreatic mass. No pancreatic ductal dilatation. No pancreatic or peripancreatic fluid or inflammatory changes. Spleen: Unremarkable. Adrenals/Urinary Tract: Multiple low-attenuation lesions in both kidneys, compatible with simple cysts, largest of which measure up to 2.6 cm in the posterior aspect of the upper pole the left kidney. Multifocal cortical thinning in both kidneys, presumably post infectious or inflammatory scarring. Bilateral adrenal glands are normal in appearance. There is no hydroureteronephrosis. Urinary bladder is normal in appearance. Stomach/Bowel: Normal appearance of the stomach. No pathologic dilatation of small bowel or colon. A few scattered colonic diverticulae are noted, without surrounding inflammatory changes to suggest an acute diverticulitis at this time. Normal appendix. Vascular/Lymphatic: Aortic atherosclerosis, without evidence of aneurysm or dissection in the abdominal or pelvic vasculature. Vascular findings and measurements pertinent to potential TAVR procedure, as detailed below. No lymphadenopathy noted in the abdomen or pelvis. Reproductive: Uterus and ovaries are unremarkable in appearance. Other: No significant volume of ascites.  No pneumoperitoneum. Musculoskeletal: Chronic appearing compression fractures of L1, L2 and L3, most severe at L3  where there is up to 90% loss of central vertebral body height. Old healed fractures of the right superior and inferior pubic rami. There are no aggressive appearing lytic or blastic lesions noted in the visualized portions of the skeleton. VASCULAR MEASUREMENTS PERTINENT TO TAVR: AORTA: Minimal Aortic Diameter-11 x 12 mm Severity of Aortic Calcification-severe RIGHT PELVIS: Right Common Iliac Artery - Minimal Diameter-7.3 x 6.9 mm Tortuosity-mild Calcification-mild Right External Iliac Artery - Minimal Diameter-5.8 x 6.1 mm Tortuosity-severe Calcification-none Right Common Femoral Artery - Minimal Diameter-5.8 x 4.5 mm Tortuosity-mild Calcification-mild LEFT PELVIS: Left Common Iliac Artery - Minimal Diameter-8.0 x 8.5 mm Tortuosity-mild Calcification-none Left External Iliac Artery - Minimal Diameter-6.1 x 6.2 mm Tortuosity-severe Calcification-none Left Common Femoral Artery - Minimal Diameter-5.5 x 5.5 mm Tortuosity-mild Calcification-moderate Review of the MIP images confirms the above findings. IMPRESSION: 1. Vascular findings and measurements pertinent to potential TAVR procedure, as detailed above. 2. Severe thickening and calcification of the aortic valve, compatible with the reported clinical history of severe aortic stenosis. 3. Aortic atherosclerosis, in addition to left main and 3 vessel coronary artery disease. 4. Colonic diverticulosis without evidence of  acute diverticulitis at this time. 5. Additional incidental findings, as above. Electronically Signed   By: Trudie Reed M.D.   On: 05/22/2019 13:51   Disposition   Pt is being discharged home today in good condition.  Follow-up Plans & Appointments    Follow-up Information    Janetta Hora, PA-C. Go on 06/14/2019.   Specialties: Cardiology, Radiology Why: @ 1:30pm for an in office visit. Please arrive at 1:15pm Contact information: [address] [place] [address] [phone]            Discharge  Medications   Allergies as of 06/07/2019      Reactions   Codeine Nausea And Vomiting      Medication List    STOP taking these medications   amLODipine 10 MG tablet Commonly known as: NORVASC     TAKE these medications   aspirin 81 MG chewable tablet Chew 1 tablet (81 mg total) by mouth daily. Start taking on: June 08, 2019   atenolol 100 MG tablet Commonly known as: TENORMIN Take 0.5 tablets (50 mg total) by mouth daily. What changed: how much to take   diphenhydrAMINE 25 MG tablet Commonly known as: BENADRYL Take 25 mg by mouth daily as needed for allergies.   Eliquis 2.5 MG Tabs tablet Generic drug: apixaban TAKE (1) TABLET BY MOUTH TWICE DAILY. What changed: See the new instructions.   lisinopril 40 MG tablet Commonly known as: ZESTRIL Take 40 mg by mouth daily.   nitroGLYCERIN 0.4 MG SL tablet Commonly known as: NITROSTAT Place 0.4 mg under the tongue every 5 (five) minutes x 3 doses as needed for chest pain.   spironolactone 25 MG tablet Commonly known as: ALDACTONE Take 25 mg by mouth daily.           Outstanding Labs/Studies   none  Duration of Discharge Encounter   Greater than 30 minutes including physician time.  Byrd Hesselbach, PA-C 06/07/2019, 12:55 PM [phone]  Patient seen, examined. Available data reviewed. Agree with findings, assessment, and plan as outlined by Carlean Jews, PA-C.  The patient is independently interviewed and examined.  She is alert, oriented, in no distress.  Lung fields are clear.  Heart is regular rate and rhythm with a soft 1/6 to 2/6 systolic ejection murmur at the right upper sternal border.  No diastolic murmur.  Abdomen is soft and nontender.  Bilateral groin sites are clear.  Extremities have no pretibial edema.  The patient's echocardiogram shows normal function of her transcatheter heart valve with no paravalvular regurgitation and a normal mean gradient of 11 mmHg.  The patient is stable for  hospital discharge today with follow-up as outlined above.  Tonny Bollman, M.D. 06/07/2019 3:33 PM

## 2019-06-08 ENCOUNTER — Telehealth: Payer: Self-pay | Admitting: Physician Assistant

## 2019-06-08 ENCOUNTER — Other Ambulatory Visit: Payer: Self-pay | Admitting: Cardiology

## 2019-06-08 ENCOUNTER — Encounter: Payer: Self-pay | Admitting: Thoracic Surgery (Cardiothoracic Vascular Surgery)

## 2019-06-08 NOTE — Telephone Encounter (Signed)
  [facility] VALVE TEAM   Patient contacted regarding discharge from [facility] on 06/07/19  Patient understands to follow up with provider Nell Range on 7/15 at [place].  Patient understands discharge instructions? yes Patient understands medications and regimen? yes Patient understands to bring all medications to this visit? yes  Angelena Form PA-C  [facility]

## 2019-06-13 ENCOUNTER — Other Ambulatory Visit: Payer: Self-pay | Admitting: Cardiology

## 2019-06-13 ENCOUNTER — Telehealth: Payer: Self-pay | Admitting: Physician Assistant

## 2019-06-13 NOTE — Progress Notes (Addendum)
[facility]   MULTIDISCIPLINARY HEART VALVE CLINIC                                       Cardiology Office Note    Date:  06/14/2019   ID:  Catherine Anthony, DOB 18-Nov-1930, MRN [id number]  PCP:  Merri BrunettePharr, Walter, MD  Cardiologist:  Dr. Melvenia NeedlesPadwardhan / Dr. Excell Seltzerooper & Dr. Laneta SimmersBartle (TAVR)  [facility]  History of Present Illness:  Catherine Anthony is a 85 y.o. 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   Patient has remained relatively active physically and functionally independent throughout her adult life. Several years ago she began to develop decreased exercise tolerance with exertional shortness of breath and increasing fatigue. She was evaluated by Dr. Rosemary HolmsPatwardhan and found to have moderateaortic stenosis and severemultivessel coronary artery disease. She underwent multivessel PCI in staged procedures including a total of 6 drug-eluting stents (1 in RCA, 2 in LAD, 3 in LCx).She states that she thinks she may have enjoyed some improvement after this, but over the past 2 years she has continued to experience decreased energy, loss of appetite, and exertional shortness of breath Follow-up transthoracic echocardiogram revealed findings consistent with severe paroxysmal low flow, low gradient with preserved left ventricular ejection fraction aortic stenosis. Left ventricular ejection fraction was estimated 66%.She underwent diagnostic cardiac catheterization on May 16, 2019 which revealed continued patency of all previously placed stents with no significant obstructive disease.   She was evaluated by the multidisciplinary valve team and underwent successful TAVR with a 23 mm Edwards Sapien 3 Ultra THV via the TF approach on 06/06/19.  Post op echo showed EF 60%, mild MS, mild-  mod TR, normally functioning TAVR with mean gradient 11 mm hg and no PVL. Her BP was in the normal range and atenolol decreased to 50mg  daily and amlodipine 10mg  daily was discontinued. There was also a question of absent right foot pulses. Arterial doppler did not show any acute ischemia and the patient was pain free at discharge. She was resumed on home Eliquis and baby aspirin.  Today she presents to clinic for follow up. No CP or SOB. No LE edema, orthopnea or PND. No dizziness or syncope. No blood in stool or urine. No palpitations. She has been walking around the house and has had no leg pain or chest pain. She used to get chest/back tightness that has now resolved.   Past Medical History:  Diagnosis Date   Arthritis    Right hand   Complication of anesthesia    Coronary artery disease    s/p mutliple PCIs   Family hx of colon cancer    High cholesterol    History of kidney stones    Hypertension    PAF (paroxysmal atrial fibrillation) (HCC)    on Eliquis   S/P TAVR (transcatheter aortic valve replacement)    Severe aortic stenosis     Past Surgical History:  Procedure Laterality Date   CATARACT EXTRACTION W/ INTRAOCULAR LENS IMPLANT Right 06/04/2011   [facility]   CATARACT EXTRACTION W/PHACO  06/22/2011   Procedure: CATARACT EXTRACTION PHACO AND INTRAOCULAR LENS PLACEMENT (IOC);  Surgeon:  Gemma PayorKerry Hunt;  Location: [facility];  Service: Ophthalmology;  Laterality: Left;  CDE 14.34   COLONOSCOPY  06/23/08   COLONOSCOPY N/A 01/18/2014   Procedure: COLONOSCOPY;  Surgeon: Malissa HippoNajeeb U Rehman, MD;  Location: [facility];  Service: Endoscopy;  Laterality: N/A;  145   CORONARY ANGIOPLASTY WITH STENT PLACEMENT     CORONARY ATHERECTOMY N/A 09/21/2017   Procedure: CORONARY ATHERECTOMY;  Surgeon: Elder NegusPatwardhan, Manish J, MD;  Location: [facility] IN[facility] CV [facility];  Service: Cardiovascular;  Laterality: N/A;   CORONARY STENT INTERVENTION N/A 08/31/2017   Procedure: CORONARY STENT INTERVENTION;  Surgeon:  Elder NegusPatwardhan, Manish J, MD;  Location: [facility] CV [facility];  Service: Cardiovascular;  Laterality: N/A;   CORONARY STENT INTERVENTION N/A 09/21/2017   Procedure: CORONARY STENT INTERVENTION;  Surgeon: Elder NegusPatwardhan, Manish J, MD;  Location: [facility];  Service: Cardiovascular;  Laterality: N/A;   EYE SURGERY Bilateral    "after cataract OR"   INTRAVASCULAR ULTRASOUND/IVUS N/A 09/21/2017   Procedure: Intravascular Ultrasound/IVUS;  Surgeon: Elder NegusPatwardhan, Manish J, MD;  Location: [facility];  Service: Cardiovascular;  Laterality: N/A;   RIGHT/LEFT HEART CATH AND CORONARY ANGIOGRAPHY N/A 08/31/2017   Procedure: RIGHT/LEFT HEART CATH AND CORONARY ANGIOGRAPHY;  Surgeon: Elder NegusPatwardhan, Manish J, MD;  [facility]: [facility];  Service: Cardiovascular;  Laterality: N/A;   RIGHT/LEFT HEART CATH AND CORONARY ANGIOGRAPHY N/A 05/16/2019   Procedure: RIGHT/LEFT HEART CATH AND CORONARY ANGIOGRAPHY;  Surgeon: Elder NegusPatwardhan, Manish J, MD;  [facility]: [facility];  Service: Cardiovascular;  Laterality: N/A;   TEE WITHOUT CARDIOVERSION N/A 06/06/2019   Procedure: TRANSESOPHAGEAL ECHOCARDIOGRAM (TEE);  Surgeon: Tonny Bollmanooper, Michael, MD;  [facility];  Service: Open Heart Surgery;  Laterality: N/A;   TRANSCATHETER AORTIC VALVE REPLACEMENT, TRANSFEMORAL N/A 06/06/2019   Procedure: TRANSCATHETER AORTIC VALVE REPLACEMENT, TRANSFEMORAL;  Surgeon: Tonny Bollmanooper, Michael, MD;  [facility];  Service: Open Heart Surgery;  Laterality: N/A;   ULTRASOUND GUIDANCE FOR VASCULAR ACCESS  09/21/2017   Procedure: Ultrasound Guidance For Vascular Access;  Surgeon: Elder NegusPatwardhan, Manish J, MD;  [facility];  Service: Cardiovascular;;   VARICOSE VEIN SURGERY Bilateral 1960s   [facility]    Current Medications: Outpatient Medications Prior to Visit  Medication Sig Dispense Refill   aspirin 81 MG chewable tablet Chew 1 tablet (81 mg total) by mouth daily.     atenolol (TENORMIN)  100 MG tablet Take 0.5 tablets (50 mg total) by mouth daily.     diphenhydrAMINE (BENADRYL) 25 MG tablet Take 25 mg by mouth daily as needed for allergies.     ELIQUIS 2.5 MG TABS tablet TAKE (1) TABLET BY MOUTH TWICE DAILY. 180 tablet 3   lisinopril (PRINIVIL,ZESTRIL) 40 MG tablet Take 40 mg by mouth daily.      nitroGLYCERIN (NITROSTAT) 0.4 MG SL tablet Place 0.4 mg under the tongue every 5 (five) minutes x 3 doses as needed for chest pain.     spironolactone (ALDACTONE) 25 MG tablet TAKE ONE TABLET BY MOUTH ONCE DAILY. 60 tablet 0   No facility-administered medications prior to visit.      Allergies:   Codeine   Social History   Socioeconomic History   Marital status: Widowed    Spouse name: Not on file   Number of children: 5   Years of education: Not on file   Highest education level: Not on file  Occupational History   Not on file  Social Needs   Financial resource strain: Not on  file   Food insecurity    Worry: Not on file    Inability: Not on file   Transportation needs    Medical: Not on file    Non-medical: Not on file  Tobacco Use   Smoking status: Never Smoker   Smokeless tobacco: Never Used  Substance and Sexual Activity   Alcohol use: No   Drug use: No   Sexual activity: Not on file  Lifestyle   Physical activity    Days per week: Not on file    Minutes per session: Not on file   Stress: Not on file  Relationships   Social connections    Talks on phone: Not on file    Gets together: Not on file    Attends religious service: Not on file    Active member of club or organization: Not on file    Attends meetings of clubs or organizations: Not on file    Relationship status: Not on file  Other Topics Concern   Not on file  Social History Narrative   Not on file     Family History:  The patient's mother and father both had heart issues.      ROS:   Please see the history of present illness.    ROS All other systems reviewed  and are negative.   PHYSICAL EXAM:   VS:  BP (!) 150/76    Pulse 70    Ht 5\' 4"  (1.626 m)    Wt 113 lb 6.4 oz (51.4 kg)    SpO2 98%    BMI 19.47 kg/m    GEN: Well nourished, well developed, in no acute distress HEENT: normal Neck: no JVD or masses Cardiac: RRR; no murmurs, rubs, or gallops,no edema  Respiratory:  clear to auscultation bilaterally, normal work of breathing GI: soft, nontender, nondistended, + BS MS: no deformity or atrophy Skin: warm and dry, no rash. Left groin with hard area that is mildly tender. No bruit.  Neuro:  Alert and Oriented x 3, Strength and sensation are intact Psych: euthymic mood, full affect   Wt Readings from Last 3 Encounters:  06/14/19 113 lb 6.4 oz (51.4 kg)  06/07/19 113 lb 4.8 oz (51.4 kg)  06/01/19 110 lb 1 oz (49.9 kg)      Studies/Labs Reviewed:   EKG:  EKG is ordered today.  The ekg ordered today demonstrates sinus with PVC HR 70  Recent Labs: 06/01/2019: ALT 11; B Natriuretic Peptide 153.6 06/07/2019: BUN 19; Creatinine, Ser 1.05; Hemoglobin 11.1; Magnesium 1.7; Platelets 193; Potassium 4.2; Sodium 136   Lipid Panel    Component Value Date/Time   CHOL 204 (H) 05/02/2019 0904   TRIG 129 05/02/2019 0904   HDL 43 05/02/2019 0904   CHOLHDL 4.7 (H) 05/02/2019 0904   LDLCALC 135 (H) 05/02/2019 0904    Additional studies/ records that were reviewed today include:  TAVR OPERATIVE NOTE   Date of Procedure:06/06/2019  Preoperative Diagnosis:Severe Aortic Stenosis   Postoperative Diagnosis:Same   Procedure:   Transcatheter Aortic Valve Replacement - PercutaneousRightTransfemoral Approach Edwards Sapien 3 UltraTHV (size4523mm, model # [id number], serial #[id number])  Co-Surgeons:Bryan Jennefer BravoK. Bartle, MD and Tonny BollmanMichael Cooper, MD   Anesthesiologist:Dan Krista BlueSinger, MD  Delano MetzEchocardiographer:Katarina Nelson, MD  Pre-operative  Echo Findings: ? Severe aortic stenosis ? Moderate AI ? Normalleft ventricular systolic function  Post-operative Echo Findings: ? Noparavalvular leak ? Normalleft ventricular systolic function  _____________   Echo 06/07/19: IMPRESSIONS  1. The left ventricle has normal systolic function with  an ejection fraction of 60-65%. The cavity size was normal. Left ventricular diastolic function could not be evaluated secondary to atrial fibrillation. Elevated left ventricular end-diastolic  pressure No evidence of left ventricular regional wall motion abnormalities.  2. The right ventricle has normal systolic function. The cavity was normal. There is no increase in right ventricular wall thickness.  3. The mitral valve is degenerative. Mild thickening of the mitral valve leaflet. Mild calcification of the anterior mitral valve leaflet. There is moderate mitral annular calcification present. Mild mitral valve stenosis. MV Mean grad: 4.5 mmHg. MV  Area (PHT): 1.47 cm.  4. Tricuspid valve regurgitation is mild-moderate.  5. - TAVR: S/P 66mm Edwards Sapien 3 Ultra THV which is functioning normally. There is no perivalvular leak. The mean AVG is 69mmHg, AVA (Vmax) 2.97cm2, dimensionless index 0.66 and AV Vmax 224 cm/s. 6. Compared to echo 06/06/2019, there is now a TAVR  present.    ASSESSMENT & PLAN:   Severe AS s/p TAVR:doing well. Previous chest/back tightness resolved since TAVR. Groin sites are stable. Her left groin has a small hard area that is mildly tender. No bruit. Will continue to monitor. ECG with no HAVB. SBE prophylaxis discussed; I have RX'd amoxicillin. Continue aspirin and Eliquis.   HTN: Bp creeping up 150/76 today. Plan to resume home amlodipine 10mg  daily.   Chronic diastolic CHF: no s/s CHF. Continue spiro 25mg  daily  CAD: pre TAVR cath showed continued patency of all previously placed stents with no significant obstructive disease.  Continue medical therapy  PAF:  in sinus today. Continue BB and Eliquis 2.5mg  BID  Medication Adjustments/Labs and Tests Ordered: Current medicines are reviewed at length with the patient today.  Concerns regarding medicines are outlined above.  Medication changes, Labs and Tests ordered today are listed in the Patient Instructions below. Patient Instructions  Medication Instructions:  Your physician has recommended you make the following change in your medication:   RESTART: amlodipine 10 mg once a day   Your physician discussed the importance of taking an antibiotic prior to any dental, gastrointestinal, genitourinary procedures to prevent damage to the heart valves from infection. You were given a prescription for an antibiotic based on current SBE prophylaxis guidelines.   A prescription has been sent in for amoxicillin  If you need a refill on your cardiac medications before your next appointment, please call your pharmacy.   Lab work: None Ordered  If you have labs (blood work) drawn today and your tests are completely normal, you will receive your results only by:  [place] (if you have MyChart) OR  A paper copy in the mail If you have any lab test that is abnormal or we need to change your treatment, we will call you to review the results.  Testing/Procedures: Keep echocardiogram appointment on 06/28/19 at 10:35 AM  Follow-Up:  Keep VIRTUAL Visit appointment with Nell Range, PA on 06/29/19 at 3:30 PM  Any Other Special Instructions Will Be Listed Below (If Applicable).       Signed, Angelena Form, PA-C  06/14/2019 4:23 PM    [facility] [address], [place], [place]  [address] Phone: [phone]; Fax: [phone]

## 2019-06-13 NOTE — Telephone Encounter (Signed)

## 2019-06-13 NOTE — Telephone Encounter (Signed)
Please fill

## 2019-06-14 ENCOUNTER — Encounter: Payer: Self-pay | Admitting: Physician Assistant

## 2019-06-14 ENCOUNTER — Telehealth: Payer: Self-pay | Admitting: Physician Assistant

## 2019-06-14 ENCOUNTER — Other Ambulatory Visit: Payer: Self-pay

## 2019-06-14 ENCOUNTER — Ambulatory Visit (INDEPENDENT_AMBULATORY_CARE_PROVIDER_SITE_OTHER): Payer: Medicare Other | Admitting: Physician Assistant

## 2019-06-14 VITALS — BP 150/76 | HR 70 | Ht 64.0 in | Wt 113.4 lb

## 2019-06-14 DIAGNOSIS — I480 Paroxysmal atrial fibrillation: Secondary | ICD-10-CM

## 2019-06-14 DIAGNOSIS — I5032 Chronic diastolic (congestive) heart failure: Secondary | ICD-10-CM

## 2019-06-14 DIAGNOSIS — I2510 Atherosclerotic heart disease of native coronary artery without angina pectoris: Secondary | ICD-10-CM

## 2019-06-14 DIAGNOSIS — Z952 Presence of prosthetic heart valve: Secondary | ICD-10-CM | POA: Diagnosis not present

## 2019-06-14 DIAGNOSIS — I10 Essential (primary) hypertension: Secondary | ICD-10-CM

## 2019-06-14 MED ORDER — AMLODIPINE BESYLATE 10 MG PO TABS: 10.0000 mg | ORAL_TABLET | Freq: Every day | ORAL | 3 refills | Status: DC

## 2019-06-14 MED ORDER — AMOXICILLIN 500 MG PO CAPS: ORAL_CAPSULE | ORAL | 3 refills | Status: DC

## 2019-06-14 NOTE — Patient Instructions (Addendum)
Medication Instructions:  Your physician has recommended you make the following change in your medication:   RESTART: amlodipine 10 mg once a day   Your physician discussed the importance of taking an antibiotic prior to any dental, gastrointestinal, genitourinary procedures to prevent damage to the heart valves from infection. You were given a prescription for an antibiotic based on current SBE prophylaxis guidelines.   A prescription has been sent in for amoxicillin  If you need a refill on your cardiac medications before your next appointment, please call your pharmacy.   Lab work: None Ordered  If you have labs (blood work) drawn today and your tests are completely normal, you will receive your results only by: [place] MyChart Message (if you have MyChart) OR . A paper copy in the mail If you have any lab test that is abnormal or we need to change your treatment, we will call you to review the results.  Testing/Procedures: Keep echocardiogram appointment on 06/28/19 at 10:35 AM  Follow-Up: . Keep VIRTUAL Visit appointment with Nell Range, PA on 06/29/19 at 3:30 PM  Any Other Special Instructions Will Be Listed Below (If Applicable).

## 2019-06-14 NOTE — Telephone Encounter (Signed)
° °  Pt c/o medication issue:  1. Name of Medication: atenolol (TENORMIN) 100 MG tablet  2. How are you currently taking this medication (dosage and times per day)?  50 mg once daily  3. Are you having a reaction (difficulty breathing--STAT)? no  4. What is your medication issue? Daughter wants to make sure that the patient is taking the proper dosage of medication. The dosage was reduced from 100mg  to 50 mg.   On the AVS printout, the dosage says to take 1/2 of a 100mg  tablet, but someone from the office called twice.   The medication was not ready at the pharmacy when the patient stopped by.

## 2019-06-15 MED ORDER — ATENOLOL 100 MG PO TABS: 50.0000 mg | ORAL_TABLET | Freq: Every day | ORAL | 11 refills | Status: DC

## 2019-06-15 NOTE — Telephone Encounter (Signed)
Noted  

## 2019-06-15 NOTE — Telephone Encounter (Signed)
Called and spoke to patient. She states that she needed a refill on her atenolol. Refill sent to preferred pharmacy. Patient also understands that she was suppose to restart her amlodipine 10 mg QD.

## 2019-06-23 NOTE — Telephone Encounter (Signed)
Medication already filled by pcp

## 2019-06-27 NOTE — Progress Notes (Signed)
[facility]   MULTIDISCIPLINARY HEART VALVE TEAM   Virtual Visit via Telephone Note   This visit type was conducted due to national recommendations for restrictions regarding the COVID-19 Pandemic (e.g. social distancing) in an effort to limit this patient's exposure and mitigate transmission in our community.  Due to her co-morbid illnesses, this patient is at least at moderate risk for complications without adequate follow up.  This format is felt to be most appropriate for this patient at this time.  The patient did not have access to video technology/had technical difficulties with video requiring transitioning to audio format only (telephone).  All issues noted in this document were discussed and addressed.  No physical exam could be performed with this format.  Please refer to the patient's chart for her  consent to telehealth for [facility].   Evaluation Performed:  Follow-up visit  Date:  06/29/2019   ID:  Catherine Anthony, DOB 05/06/1930, MRN [id number]  Patient Location: Home Provider Location: Office  PCP:  Merri BrunettePharr, Walter, MD  Cardiologist: Dr. Rosemary HolmsPatwardhan / Dr. Excell Seltzerooper & Dr. Cornelius Moraswen (TAVR)  Chief Complaint:  1 month s/p TAVR   History of Present Illness:    Ynez Lavonna RuaM Hufstedler is a 85 y.o. 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   Patient has remained relatively active physically and functionally independent throughout her adult life. Several years ago she began to develop decreased exercise tolerance with exertional shortness of breath and increasing fatigue. She was evaluated by Dr. Rosemary HolmsPatwardhan and found to have moderateaortic stenosis and severemultivessel coronary artery disease. She underwent multivessel PCI in staged procedures including a total of 6 drug-eluting  stents (1 in RCA, 2 in LAD, 3 in LCx).She states that she thinks she may have enjoyed some improvement after this, but over the past 2 years she has continued to experience decreased energy, loss of appetite, and exertional shortness of breath Follow-up transthoracic echocardiogram revealed findings consistent with severe paroxysmal low flow, low gradient with preserved left ventricular ejection fraction aortic stenosis. Left ventricular ejection fraction was estimated 66%.She underwent diagnostic cardiac catheterization on May 16, 2019 which revealed continued patency of all previously placed stents with no significant obstructive disease.  She was evaluated by the multidisciplinary valve team and underwent successful TAVR with a10523mm Edwards Sapien 3 UltraTHV via the TFapproach on 06/06/19.  Post op echo showed EF 60%, mild MS, mild- mod TR, normally functioning TAVR with mean gradient 11 mm hg and no PVL. Her BP was in the normal range and atenolol decreased to 50mg  daily and amlodipine 10mg  daily was discontinued. There was also a question of absent right foot pulses. Arterial doppler did not show any acute ischemia and the patient was pain free at discharge. She was resumed on home Eliquis and baby aspirin. At follow up her amlodipine was resumed due to HTN.   Today she presents for follow up. No CP or SOB. No LE edema, orthopnea or PND. No dizziness or syncope. No blood in stool or urine. No palpitations.   Past Medical History:  Diagnosis Date  . Arthritis    Right hand  . Complication of anesthesia   . Coronary artery disease    s/p mutliple PCIs  . Family hx of colon cancer   .  High cholesterol   . History of kidney stones   . Hypertension   . PAF (paroxysmal atrial fibrillation) (HCC)    on Eliquis  . S/P TAVR (transcatheter aortic valve replacement)   . Severe aortic stenosis    Past Surgical History:  Procedure Laterality Date  . CATARACT EXTRACTION W/ INTRAOCULAR LENS  IMPLANT Right 06/04/2011   [facility]  . CATARACT EXTRACTION W/PHACO  06/22/2011   Procedure: CATARACT EXTRACTION PHACO AND INTRAOCULAR LENS PLACEMENT (IOC);  Surgeon: Gemma PayorKerry Hunt;  Location: [facility];  Service: Ophthalmology;  Laterality: Left;  CDE 14.34  . COLONOSCOPY  06/23/08  . COLONOSCOPY N/A 01/18/2014   Procedure: COLONOSCOPY;  Surgeon: Malissa HippoNajeeb U Rehman, MD;  Location: [facility];  Service: Endoscopy;  Laterality: N/A;  145  . CORONARY ANGIOPLASTY WITH STENT PLACEMENT    . CORONARY ATHERECTOMY N/A 09/21/2017   Procedure: CORONARY ATHERECTOMY;  Surgeon: Elder NegusPatwardhan, Manish J, MD;  Location: [facility];  Service: Cardiovascular;  Laterality: N/A;  . CORONARY STENT INTERVENTION N/A 08/31/2017   Procedure: CORONARY STENT INTERVENTION;  Surgeon: Elder NegusPatwardhan, Manish J, MD;  Location: [facility];  Service: Cardiovascular;  Laterality: N/A;  . CORONARY STENT INTERVENTION N/A 09/21/2017   Procedure: CORONARY STENT INTERVENTION;  Surgeon: Elder NegusPatwardhan, Manish J, MD;  [facility]: [facility];  Service: Cardiovascular;  Laterality: N/A;  . EYE SURGERY Bilateral    "after cataract OR"  . INTRAVASCULAR ULTRASOUND/IVUS N/A 09/21/2017   Procedure: Intravascular Ultrasound/IVUS;  Surgeon: Elder NegusPatwardhan, Manish J, MD;  [facility];  Service: Cardiovascular;  Laterality: N/A;  . RIGHT/LEFT HEART CATH AND CORONARY ANGIOGRAPHY N/A 08/31/2017   Procedure: RIGHT/LEFT HEART CATH AND CORONARY ANGIOGRAPHY;  Surgeon: Elder NegusPatwardhan, Manish J, MD;  [facility];  Service: Cardiovascular;  Laterality: N/A;  . RIGHT/LEFT HEART CATH AND CORONARY ANGIOGRAPHY N/A 05/16/2019   Procedure: RIGHT/LEFT HEART CATH AND CORONARY ANGIOGRAPHY;  Surgeon: Elder NegusPatwardhan, Manish J, MD;  [facility];  Service: Cardiovascular;  Laterality: N/A;  . TEE WITHOUT CARDIOVERSION N/A 06/06/2019   Procedure: TRANSESOPHAGEAL ECHOCARDIOGRAM (TEE);  Surgeon: Tonny Bollmanooper, Michael, MD;  [facility];  Service: Open Heart Surgery;  Laterality: N/A;  . TRANSCATHETER AORTIC VALVE REPLACEMENT, TRANSFEMORAL N/A 06/06/2019   Procedure: TRANSCATHETER AORTIC VALVE REPLACEMENT, TRANSFEMORAL;  Surgeon: Tonny Bollmanooper, Michael, MD;  [facility];  Service: Open Heart Surgery;  Laterality: N/A;  . ULTRASOUND GUIDANCE FOR VASCULAR ACCESS  09/21/2017   Procedure: Ultrasound Guidance For Vascular Access;  Surgeon: Elder NegusPatwardhan, Manish J, MD;  [facility];  Service: Cardiovascular;;  . VARICOSE VEIN SURGERY Bilateral 1960s   [facility]     Current Meds  Medication Sig  . amLODipine (NORVASC) 10 MG tablet Take 1 tablet (10 mg total) by mouth daily.  Marland Kitchen. amoxicillin (AMOXIL) 500 MG capsule Take 4 capsules 1 hour prior to any dental procedures  . aspirin 81 MG chewable tablet Chew 1 tablet (81 mg total) by mouth daily.  . diphenhydrAMINE (BENADRYL) 25 MG tablet Take 25 mg by mouth daily as needed for allergies.  Marland Kitchen. ELIQUIS 2.5 MG TABS tablet TAKE (1) TABLET BY MOUTH TWICE DAILY.  Marland Kitchen. lisinopril (PRINIVIL,ZESTRIL) 40 MG tablet Take 40 mg by mouth daily.   . nitroGLYCERIN (NITROSTAT) 0.4 MG SL tablet Place 0.4 mg under the tongue every 5 (five) minutes x 3 doses as needed for chest pain.  Marland Kitchen. spironolactone (ALDACTONE) 25 MG tablet TAKE ONE TABLET BY MOUTH ONCE DAILY.  Allergies:   Codeine   Social History   Tobacco Use  . Smoking status: Never Smoker  . Smokeless tobacco: Never Used  Substance Use Topics  . Alcohol use: No  . Drug use: No     Family Hx: The patient's family history is not on file.  ROS:   Please see the history of present illness.    All other systems reviewed and are negative.   Prior CV studies:   The following studies were reviewed today:  TAVR OPERATIVE NOTE   Date of Procedure:06/06/2019  Preoperative Diagnosis:Severe Aortic Stenosis   Postoperative Diagnosis:Same   Procedure:   Transcatheter Aortic  Valve Replacement - PercutaneousRightTransfemoral Approach Edwards Sapien 3 UltraTHV (size35mm, model # [id number], serial #[id number])  Co-Surgeons:Bryan Alveria Apley, MD and Sherren Mocha, MD   Anesthesiologist:Dan Tobias Alexander, MD  Dala Dock, MD  Pre-operative Echo Findings: ? Severe aortic stenosis ? Moderate AI ? Normalleft ventricular systolic function  Post-operative Echo Findings: ? Noparavalvular leak ? Normalleft ventricular systolic function  _____________   Echo 06/07/19: IMPRESSIONS 1. The left ventricle has normal systolic function with an ejection fraction of 60-65%. The cavity size was normal. Left ventricular diastolic function could not be evaluated secondary to atrial fibrillation. Elevated left ventricular end-diastolic  pressure No evidence of left ventricular regional wall motion abnormalities. 2. The right ventricle has normal systolic function. The cavity was normal. There is no increase in right ventricular wall thickness. 3. The mitral valve is degenerative. Mild thickening of the mitral valve leaflet. Mild calcification of the anterior mitral valve leaflet. There is moderate mitral annular calcification present. Mild mitral valve stenosis. MV Mean grad: 4.5 mmHg. MV  Area (PHT): 1.47 cm. 4. Tricuspid valve regurgitation is mild-moderate. 5. - TAVR: S/P 24mm Edwards Sapien 3 Ultra THV which is functioning normally. There is no perivalvular leak. The mean AVG is 15mmHg, AVA (Vmax) 2.97cm2, dimensionless index 0.66 and AV Vmax 224 cm/s. 6. Compared to echo 06/06/2019, there is now a TAVR  Present.  _____________   Echo 06/28/19 IMPRESSIONS  1. The left ventricle has hyperdynamic systolic function, with an ejection fraction of >65%. The cavity size was normal. There is moderately increased left ventricular wall thickness. Left ventricular diastolic  Doppler parameters are consistent with  impaired relaxation. Indeterminate filling pressures The E/e' is 8-15. No evidence of left ventricular regional wall motion abnormalities.  2. The right ventricle has normal systolic function. The cavity was normal. There is no increase in right ventricular wall thickness.  3. The mitral valve is abnormal. Mild thickening of the mitral valve leaflet. Mild calcification of the mitral valve leaflet. There is severe mitral annular calcification present.  4. The tricuspid valve is grossly normal. Tricuspid valve regurgitation is moderate.  5. A 1mm an Wende Crease Sapien bioprosthetic aortic valve (TAVR) valve is present in the aortic position. Procedure Date: 06/06/2019 Normal aortic valve prosthesis.  6. The aortic valve is tricuspid. Mild sclerosis of the aortic valve. Mild stenosis of the aortic valve.  7. The aorta is normal in size and structure.  8. When compared to the prior study: 06/07/2019: LVEF 60-65%, AOV Mean gradient 4.5 mmHg. SUMMARY LVEF >65%, moderate LVH, normal wall motion, grade 1 DD, indeterminate LV filling pressure, normal LA size, Edwards Sapien 3 TAVR (23 mm), normally functioning without perivalvular leak - mean gradient 9 mmHg, moderate TR, RVSP 35 mmHg, normal IVC   Labs/Other Tests and Data Reviewed:    EKG:  No ECG reviewed.  Recent Labs: 06/01/2019:  ALT 11; B Natriuretic Peptide 153.6 06/07/2019: BUN 19; Creatinine, Ser 1.05; Hemoglobin 11.1; Magnesium 1.7; Platelets 193; Potassium 4.2; Sodium 136   Recent Lipid Panel Lab Results  Component Value Date/Time   CHOL 204 (H) 05/02/2019 09:04 AM   TRIG 129 05/02/2019 09:04 AM   HDL 43 05/02/2019 09:04 AM   CHOLHDL 4.7 (H) 05/02/2019 09:04 AM   LDLCALC 135 (H) 05/02/2019 09:04 AM    Wt Readings from Last 3 Encounters:  06/29/19 109 lb (49.4 kg)  06/14/19 113 lb 6.4 oz (51.4 kg)  06/07/19 113 lb 4.8 oz (51.4 kg)     Objective:    Vital Signs:  BP (!) 125/95   Pulse  (!) 114   Ht 5\' 4"  (1.626 m)   Wt 109 lb (49.4 kg)   BMI 18.71 kg/m     ASSESSMENT & PLAN:    Severe AS s/p TAVR: echo from 06/28/19 shows EF 65%, normally functioning TAVR with mean gradient 9 mm hg and no PVL. She has NYHA class I symptoms. KCCQ completed and copied below. SBE prophylaxis discussed; she has amoxicillin. Continue on Aspirin and Eliquis. ASA can be discontinued after 6 months of therapy (12/06/18). I will see her back in 1yaer for follow up echo and office visit.   HTN: Bp much better controlled on her amlodipine. Continue current regimen  Chronic diastolic CHF: no s/s CHF. Continue spiro 25mg  daily.   CAD: pre TAVR cath showedcontinued patency of all previously placed stents with no significant obstructive disease. Continue medical therapy  PAF: HR elevated today. She thinks it was just elevated because she was running around trying to get ready for appointment. Also, she ran out of her atenolol and this has been called in today. Continue on Eliquis   COVID-19 Education: The signs and symptoms of COVID-19 were discussed with the patient and how to seek care for testing (follow up with PCP or arrange E-visit).  The importance of social distancing was discussed today.  Time:   Today, I have spent 12 minutes with the patient with telehealth technology discussing the above problems.     Medication Adjustments/Labs and Tests Ordered: Current medicines are reviewed at length with the patient today.  Concerns regarding medicines are outlined above.   Tests Ordered: No orders of the defined types were placed in this encounter.   Medication Changes: Meds ordered this encounter  Medications  . atenolol (TENORMIN) 100 MG tablet    Sig: Take 0.5 tablets (50 mg total) by mouth daily.    Dispense:  15 tablet    Refill:  [email or web address]   [place] Cardiomyopathy Questionnaire  KCCQ-12 06/29/2019  Ability to shower/bathe Not at all limited  Ability to walk 1 block Other, Did not  do  Ability to hurry/jog Other, Did not do  Edema feet/ankles/legs Never over the past 2 weeks  Limited by fatigue Never over the past 2 weeks  Limited by dyspnea Never over the past 2 weeks  Sitting up / on 3+ pillows Never over the past 2 weeks  Limited enjoyment of life Not limited at all  Rest of life w/ symptoms Completely satisfied  Participation in hobbies N/A, did not do for other reasons  Participation in chores Did not limit at all  Visiting family/friends N/A, did not do for other reasons      Disposition:  Follow up in 1 year(s)  Signed, Cline CrockKathryn , PA-C  06/29/2019 12:16 PM     [facility]

## 2019-06-28 ENCOUNTER — Other Ambulatory Visit: Payer: Self-pay

## 2019-06-28 ENCOUNTER — Ambulatory Visit (HOSPITAL_COMMUNITY): Payer: Medicare Other | Attending: Internal Medicine

## 2019-06-28 ENCOUNTER — Telehealth: Payer: Self-pay | Admitting: Physician Assistant

## 2019-06-28 DIAGNOSIS — I350 Nonrheumatic aortic (valve) stenosis: Secondary | ICD-10-CM | POA: Diagnosis not present

## 2019-06-28 DIAGNOSIS — Z952 Presence of prosthetic heart valve: Secondary | ICD-10-CM

## 2019-06-28 NOTE — Telephone Encounter (Signed)

## 2019-06-29 ENCOUNTER — Telehealth (INDEPENDENT_AMBULATORY_CARE_PROVIDER_SITE_OTHER): Payer: Medicare Other | Admitting: Physician Assistant

## 2019-06-29 VITALS — BP 125/95 | HR 114 | Ht 64.0 in | Wt 109.0 lb

## 2019-06-29 DIAGNOSIS — Z952 Presence of prosthetic heart valve: Secondary | ICD-10-CM | POA: Diagnosis not present

## 2019-06-29 DIAGNOSIS — I2510 Atherosclerotic heart disease of native coronary artery without angina pectoris: Secondary | ICD-10-CM

## 2019-06-29 DIAGNOSIS — I480 Paroxysmal atrial fibrillation: Secondary | ICD-10-CM

## 2019-06-29 DIAGNOSIS — I5032 Chronic diastolic (congestive) heart failure: Secondary | ICD-10-CM

## 2019-06-29 DIAGNOSIS — I10 Essential (primary) hypertension: Secondary | ICD-10-CM

## 2019-06-29 MED ORDER — ATENOLOL 100 MG PO TABS: 50.0000 mg | ORAL_TABLET | Freq: Every day | ORAL | 11 refills | Status: DC

## 2019-06-29 NOTE — Progress Notes (Signed)
Thanks

## 2019-06-29 NOTE — Patient Instructions (Signed)
Medication Instructions:  Your physician recommends that you continue on your current medications as directed. Please refer to the Current Medication list given to you today. Stop aspirin on January 7,2021 If you need a refill on your cardiac medications before your next appointment, please call your pharmacy.   Lab work: none If you have labs (blood work) drawn today and your tests are completely normal, you will receive your results only by: Marland Kitchen MyChart Message (if you have MyChart) OR . A paper copy in the mail If you have any lab test that is abnormal or we need to change your treatment, we will call you to review the results.  Testing/Procedures: Your physician has requested that you have an echocardiogram. Echocardiography is a painless test that uses sound waves to create images of your heart. It provides your doctor with information about the size and shape of your heart and how well your heart's chambers and valves are working. This procedure takes approximately one hour. There are no restrictions for this procedure. To be done in July 2021.  Scheduled for July 7,2021 at 3:00  Follow-Up: You are scheduled to see K. Grandville Silos, [place] on July 7,2021 at 4:00

## 2019-08-01 ENCOUNTER — Other Ambulatory Visit: Payer: Self-pay | Admitting: Cardiology

## 2019-08-01 ENCOUNTER — Other Ambulatory Visit (HOSPITAL_COMMUNITY): Payer: Self-pay | Admitting: Cardiology

## 2019-08-01 DIAGNOSIS — E782 Mixed hyperlipidemia: Secondary | ICD-10-CM | POA: Insufficient documentation

## 2019-08-02 LAB — LIPID PANEL
Chol/HDL Ratio: 4.6 ratio — ABNORMAL HIGH (ref 0.0–4.4)
Cholesterol, Total: 211 mg/dL — ABNORMAL HIGH (ref 100–199)
HDL: 46 mg/dL (ref >39)
LDL Chol Calc (NIH): 144 mg/dL — ABNORMAL HIGH (ref 0–99)
Triglycerides: 119 mg/dL (ref 0–149)
VLDL Cholesterol Cal: 21 mg/dL (ref 5–40)

## 2019-08-09 ENCOUNTER — Other Ambulatory Visit: Payer: Self-pay

## 2019-08-09 ENCOUNTER — Encounter: Payer: Self-pay | Admitting: Cardiology

## 2019-08-09 ENCOUNTER — Ambulatory Visit (INDEPENDENT_AMBULATORY_CARE_PROVIDER_SITE_OTHER): Payer: Medicare Other | Admitting: Cardiology

## 2019-08-09 VITALS — BP 107/52 | HR 57 | Temp 97.1°F | Ht 64.0 in | Wt 110.6 lb

## 2019-08-09 DIAGNOSIS — I10 Essential (primary) hypertension: Secondary | ICD-10-CM

## 2019-08-09 DIAGNOSIS — Z298 Encounter for other specified prophylactic measures: Secondary | ICD-10-CM | POA: Diagnosis not present

## 2019-08-09 DIAGNOSIS — Z952 Presence of prosthetic heart valve: Secondary | ICD-10-CM

## 2019-08-09 DIAGNOSIS — I2510 Atherosclerotic heart disease of native coronary artery without angina pectoris: Secondary | ICD-10-CM

## 2019-08-09 DIAGNOSIS — I493 Ventricular premature depolarization: Secondary | ICD-10-CM

## 2019-08-09 MED ORDER — METOPROLOL TARTRATE 25 MG PO TABS: 12.5000 mg | ORAL_TABLET | Freq: Two times a day (BID) | ORAL | 2 refills | Status: DC

## 2019-08-09 NOTE — Progress Notes (Signed)
Follow up visit  Subjective:   Catherine Anthony, female    DOB: 1930-09-07, 85 y.o.   MRN: [id number]   Chief Complaint  Patient presents with  . Coronary Artery Disease  . Aortic Stenosis  . Follow-up    75mowith lab results, c/o of ongoing SOB     HPI  85year old Caucasian female with hypertension, CAD s/p multivessel PCI (09/2017), s/p 23 mm Edwards Sapien TAVR (05/2019), paroxysmal Afib.  Patient has been doing well since TAVR. Shortness of breath has improved. She has noticed her blood pressure to be low. She denies any presyncope or syncope.    Past Medical History:  Diagnosis Date  . Arthritis    Right hand  . Complication of anesthesia   . Coronary artery disease    s/p mutliple PCIs  . Family hx of colon cancer   . High cholesterol   . History of kidney stones   . Hypertension   . PAF (paroxysmal atrial fibrillation) (HCC)    on Eliquis  . S/P TAVR (transcatheter aortic valve replacement)   . Severe aortic stenosis      Past Surgical History:  Procedure Laterality Date  . CATARACT EXTRACTION W/ INTRAOCULAR LENS IMPLANT Right 06/04/2011   [facility]  . CATARACT EXTRACTION W/PHACO  06/22/2011   Procedure: CATARACT EXTRACTION PHACO AND INTRAOCULAR LENS PLACEMENT (IOC);  Surgeon: KTonny Branch  Location: [facility];  Service: Ophthalmology;  Laterality: Left;  CDE 14.34  . COLONOSCOPY  06/23/08  . COLONOSCOPY N/A 01/18/2014   Procedure: COLONOSCOPY;  Surgeon: NRogene Houston MD;  Location: [facility];  Service: Endoscopy;  Laterality: N/A;  145  . CORONARY ANGIOPLASTY WITH STENT PLACEMENT    . CORONARY ATHERECTOMY N/A 09/21/2017   Procedure: CORONARY ATHERECTOMY;  Surgeon: PNigel Mormon MD;  [facility];  Service: Cardiovascular;  Laterality: N/A;  . CORONARY STENT INTERVENTION N/A 08/31/2017   Procedure: CORONARY STENT INTERVENTION;  Surgeon: PNigel Mormon MD;  [facility];  Service: Cardiovascular;  Laterality: N/A;   . CORONARY STENT INTERVENTION N/A 09/21/2017   Procedure: CORONARY STENT INTERVENTION;  Surgeon: PNigel Mormon MD;  [facility];  Service: Cardiovascular;  Laterality: N/A;  . EYE SURGERY Bilateral    "after cataract OR"  . INTRAVASCULAR ULTRASOUND/IVUS N/A 09/21/2017   Procedure: Intravascular Ultrasound/IVUS;  Surgeon: PNigel Mormon MD;  [facility];  Service: Cardiovascular;  Laterality: N/A;  . RIGHT/LEFT HEART CATH AND CORONARY ANGIOGRAPHY N/A 08/31/2017   Procedure: RIGHT/LEFT HEART CATH AND CORONARY ANGIOGRAPHY;  Surgeon: PNigel Mormon MD;  [facility];  Service: Cardiovascular;  Laterality: N/A;  . RIGHT/LEFT HEART CATH AND CORONARY ANGIOGRAPHY N/A 05/16/2019   Procedure: RIGHT/LEFT HEART CATH AND CORONARY ANGIOGRAPHY;  Surgeon: PNigel Mormon MD;  [facility];  Service: Cardiovascular;  Laterality: N/A;  . TEE WITHOUT CARDIOVERSION N/A 06/06/2019   Procedure: TRANSESOPHAGEAL ECHOCARDIOGRAM (TEE);  Surgeon: CSherren Mocha MD;  [facility];  Service: Open Heart Surgery;  Laterality: N/A;  . TRANSCATHETER AORTIC VALVE REPLACEMENT, TRANSFEMORAL N/A 06/06/2019   Procedure: TRANSCATHETER AORTIC VALVE REPLACEMENT, TRANSFEMORAL;  Surgeon: CSherren Mocha MD;  [facility];  Service: Open Heart Surgery;  Laterality: N/A;  . ULTRASOUND GUIDANCE FOR VASCULAR ACCESS  09/21/2017   Procedure: Ultrasound Guidance For Vascular Access;  Surgeon: PNigel Mormon MD;  [facility];  Service: Cardiovascular;;  .  VARICOSE VEIN SURGERY Bilateral 1960s   [facility]     Social History   Socioeconomic History  . Marital status: Widowed    Spouse name: Not on file  . Number of children: 5  . Years of education: Not on file  . Highest education level: Not on file  Occupational History  . Not on file  Social Needs  . Financial resource strain: Not on file   . Food insecurity    Worry: Not on file    Inability: Not on file  . Transportation needs    Medical: Not on file    Non-medical: Not on file  Tobacco Use  . Smoking status: Never Smoker  . Smokeless tobacco: Never Used  Substance and Sexual Activity  . Alcohol use: No  . Drug use: No  . Sexual activity: Not on file  Lifestyle  . Physical activity    Days per week: Not on file    Minutes per session: Not on file  . Stress: Not on file  Relationships  . Social Herbalist on phone: Not on file    Gets together: Not on file    Attends religious service: Not on file    Active member of club or organization: Not on file    Attends meetings of clubs or organizations: Not on file    Relationship status: Not on file  . Intimate partner violence    Fear of current or ex partner: Not on file    Emotionally abused: Not on file    Physically abused: Not on file    Forced sexual activity: Not on file  Other Topics Concern  . Not on file  Social History Narrative  . Not on file     History reviewed. No pertinent family history.   Current Outpatient Medications on File Prior to Visit  Medication Sig Dispense Refill  . amLODipine (NORVASC) 10 MG tablet Take 1 tablet (10 mg total) by mouth daily. 90 tablet 3  . amoxicillin (AMOXIL) 500 MG capsule Take 4 capsules 1 hour prior to any dental procedures 4 capsule 3  . aspirin 81 MG chewable tablet Chew 1 tablet (81 mg total) by mouth daily.    . diphenhydrAMINE (BENADRYL) 25 MG tablet Take 25 mg by mouth daily as needed for allergies.    Marland Kitchen ELIQUIS 2.5 MG TABS tablet TAKE (1) TABLET BY MOUTH TWICE DAILY. 180 tablet 3  . lisinopril (PRINIVIL,ZESTRIL) 40 MG tablet Take 40 mg by mouth daily.     . nitroGLYCERIN (NITROSTAT) 0.4 MG SL tablet Place 0.4 mg under the tongue every 5 (five) minutes x 3 doses as needed for chest pain.     No current facility-administered medications on file prior to visit.     Cardiovascular studies:   EKG 08/09/2019: Sinus rhythm 71 bpm First degree AV block Frequent PVC's  Occasional PAC PVC's are more frequent compared to previous EKG on 06/07/2019  Echocardiogram 06/28/2019:  1. The left ventricle has hyperdynamic systolic function, with an ejection fraction of >65%. The cavity size was normal. There is moderately increased left ventricular wall thickness. Left ventricular diastolic Doppler parameters are consistent with  impaired relaxation. Indeterminate filling pressures The E/e' is 8-15. No evidence of left ventricular regional wall motion abnormalities.  2. The right ventricle has normal systolic function. The cavity was normal. There is no increase in right ventricular wall thickness.  3. The mitral valve is abnormal. Mild thickening of the mitral valve leaflet.  Mild calcification of the mitral valve leaflet. There is severe mitral annular calcification present.  4. The tricuspid valve is grossly normal. Tricuspid valve regurgitation is moderate.  5. A 52m an EWende CreaseSapien bioprosthetic aortic valve (TAVR) valve is present in the aortic position. Procedure Date: 06/06/2019 Normal aortic valve prosthesis.  6. The aortic valve is tricuspid. Mild sclerosis of the aortic valve. Mild stenosis of the aortic valve.  7. The aorta is normal in size and structure.  8. When compared to the prior study: 06/07/2019: LVEF 60-65%, AOV Mean gradient 4.5 mmHg.  TAVR 06/06/2019: 23 mm Edwards Sapien bioprosthetic aortic valve (Dr. CBurt Knack  [place]7/05/2019: Right: Patent common femoral, superficial femoral, and popliteal arteries. Monophasic waveforms are detected distal to the tibioperoneal trunk.  Carotid artery duplex 12/28/2017: Minimal stenosis in the right internal carotid artery (1-15%). Left vertebral artery waveform is biphasic and suggests proximal left subclavian artery stenosis.  Antegrade right vertebral artery flow. Further studies if clinically indicated.  Coronary atherectomy  and angioplasty 09/21/2017: Successful diamondback orbital atherectomy and two-vessel PCI PTCA and overlapping drug-eluting stents placement LCx Synergy DES 2.5 x 12 mm Synergy DES 2.5 x 20 mm Synergy DES 3.0 X 20 mm PTCA and overlapping drug-eluting stents placement LAD Synergy DES 3.0 x 24 mm Synergy DES 3.5 X 8 mm  Coronary angioram, LHC/RHC, RCA PCI 09/01/2017: Moderate calcific aortic and mitral valve stenosis Severe multivessel CAD Staged PCI to mid-prox RCA Successful PTCA and stent placement Resolute Onyx 3.5 X 26 mm DES  Moderate calcific aortic and mitral valve stenosis Severe multivessel CAD Staged PCI to mid-prox RCA Successful PTCA and stent placement Resolute Onyx 3.5 X 26 mm DES  Recent labs: Results for CDALEIGH, POLLINGER(MRN [id number] as of 08/09/2019 11:52  Ref. Range 08/01/2019 10:15  Total CHOL/HDL Ratio Latest Ref Range: 0.0 - 4.4 ratio 4.6 (H)  Cholesterol, Total Latest Ref Range: 100 - 199 mg/dL 211 (H)  HDL Cholesterol Latest Ref Range: >39 mg/dL 46  Triglycerides Latest Ref Range: 0 - 149 mg/dL 119  VLDL Cholesterol Cal Latest Ref Range: 5 - 40 mg/dL 21  Calculated LDL 141 mg/dL  08/15/2018: Glucose 87. BUN/Cr 18/1.05. eGFR 48. Na/K 139/4.5  01/24/2018: Cholesterol 143, triglycerides 71, HDL 53, LDL 76. Glucose 84.  BUN/creatinine 22/0.9.  EGFR 67, sodium 137, potassium 4.3 H/H 13.2/39.9.  MCV 95.  Platelets 264   Review of Systems  Constitution: Negative for decreased appetite, malaise/fatigue, weight gain and weight loss.  HENT: Negative for congestion.   Eyes: Negative for visual disturbance.  Cardiovascular: Positive for dyspnea on exertion (Improved). Negative for chest pain, leg swelling, palpitations and syncope.  Respiratory: Positive for shortness of breath (Improved). Negative for cough.   Endocrine: Negative for cold intolerance.  Hematologic/Lymphatic: Does not bruise/bleed easily.  Skin: Negative for itching and rash.  Musculoskeletal:  Negative for myalgias.  Gastrointestinal: Negative for abdominal pain, nausea and vomiting.  Genitourinary: Negative for dysuria.  Neurological: Negative for dizziness and weakness.  Psychiatric/Behavioral: The patient is not nervous/anxious.   All other systems reviewed and are negative.        Vitals:   08/09/19 1123  BP: 97/62  Pulse: (!) 42  Temp: (!) 97.1 F (36.2 C)  SpO2: 98%    Body mass index is 18.98 kg/m. Filed Weights   08/09/19 1123  Weight: 50.2 kg     Objective:   Physical Exam  Constitutional: She is oriented to person, place, and time. She appears well-developed  and well-nourished. No distress.  HENT:  Head: Normocephalic and atraumatic.  Eyes: Pupils are equal, round, and reactive to light. Conjunctivae are normal.  Neck: No JVD present.  Cardiovascular: Normal rate and regular rhythm.  Murmur heard.  Harsh midsystolic murmur is present with a grade of 1/6 at the upper right sternal border. Pulses:      Femoral pulses are 3+ on the right side and 3+ on the left side.      Popliteal pulses are 2+ on the right side and 1+ on the left side.       Dorsalis pedis pulses are 0 on the right side and 0 on the left side.       Posterior tibial pulses are 0 on the left side.  Pulmonary/Chest: Effort normal and breath sounds normal. She has no wheezes. She has no rales.  Abdominal: Soft. Bowel sounds are normal. There is no rebound.  Musculoskeletal:        General: No edema.  Lymphadenopathy:    She has no cervical adenopathy.  Neurological: She is alert and oriented to person, place, and time. No cranial nerve deficit.  Skin: Skin is warm and dry.  Psychiatric: She has a normal mood and affect.  Nursing note and vitals reviewed.         Assessment & Recommendations:   85 year old Caucasian female with hypertension, CAD s/p multivessel PCI (09/2017), s/p 23 mm Edwards Sapien TAVR (05/2019), paroxysmal Afib.  1. S/p TAVR: Normal valve functioning.  Endocarditis prophylaxis  2. PVC:  More frequent recently. Given low normal blood pressure, stop atenolol. Start low dose metoprolol tartarate 12.5 mg bid.  Also stopped spironolactone given low normal blood pressure. F/u in 2 weeks to repeat BP check and EKG.  3. Hyperlipidemia: It appears that she is off rosuvastatin. Will discuss at upcoming visit.   4. Exertional dyspnea Improved since TAVR.  5. Coronary artery disease involving native coronary artery of native heart without angina pectoris Stable Continue Aspirin. Will resume statin at next visit.   6. PAF: [id number] score 4, annual stroke risk 5% Conitnue eliquis 2.5 mg bid   Nigel Mormon, MD [facility] Pager: [phone] Office: [phone] If no answer Cell [phone]

## 2019-09-01 ENCOUNTER — Encounter: Payer: Self-pay | Admitting: Cardiology

## 2019-09-01 ENCOUNTER — Ambulatory Visit (INDEPENDENT_AMBULATORY_CARE_PROVIDER_SITE_OTHER): Payer: Medicare Other | Admitting: Cardiology

## 2019-09-01 ENCOUNTER — Other Ambulatory Visit: Payer: Self-pay

## 2019-09-01 VITALS — BP 130/61 | HR 44 | Temp 97.5°F | Ht 64.0 in | Wt 109.1 lb

## 2019-09-01 DIAGNOSIS — I493 Ventricular premature depolarization: Secondary | ICD-10-CM | POA: Diagnosis not present

## 2019-09-01 DIAGNOSIS — I10 Essential (primary) hypertension: Secondary | ICD-10-CM | POA: Diagnosis not present

## 2019-09-01 DIAGNOSIS — I2510 Atherosclerotic heart disease of native coronary artery without angina pectoris: Secondary | ICD-10-CM | POA: Diagnosis not present

## 2019-09-01 DIAGNOSIS — Z952 Presence of prosthetic heart valve: Secondary | ICD-10-CM | POA: Diagnosis not present

## 2019-09-01 MED ORDER — METOPROLOL TARTRATE 25 MG PO TABS: 25.0000 mg | ORAL_TABLET | Freq: Two times a day (BID) | ORAL | 2 refills | Status: DC

## 2019-09-01 MED ORDER — ROSUVASTATIN CALCIUM 20 MG PO TABS: 20.0000 mg | ORAL_TABLET | Freq: Every day | ORAL | 3 refills | Status: DC

## 2019-09-01 NOTE — Progress Notes (Signed)
Follow up visit  Subjective:   Catherine Anthony, female    DOB: 1930-11-29, 85 y.o.   MRN: [id number]   Chief Complaint  Patient presents with  . PVC  . Hypertension  . 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  Patient is here today with her daughter Catherine Anthony. At last visit, I noted her to low blood pressure and frequent PVC, albeit without any symptoms. I stopped her atenolol and started low dose metoprolol tartarate 12.5 mg bid. I also stopped spironolactone.   It appears that she is still taking spironolactone. She continues to have exertional dyspnea, although somewhat improved since TAVR. She reports palpitations. She denies chest pain, leg edema, orthopnea, PND.    Past Medical History:  Diagnosis Date  . Arthritis    Right hand  . Complication of anesthesia   . Coronary artery disease    s/p mutliple PCIs  . Family hx of colon cancer   . High cholesterol   . History of kidney stones   . Hypertension   . PAF (paroxysmal atrial fibrillation) (HCC)    on Eliquis  . S/P TAVR (transcatheter aortic valve replacement)   . Severe aortic stenosis      Past Surgical History:  Procedure Laterality Date  . CATARACT EXTRACTION W/ INTRAOCULAR LENS IMPLANT Right 06/04/2011   [facility]  . CATARACT EXTRACTION W/PHACO  06/22/2011   Procedure: CATARACT EXTRACTION PHACO AND INTRAOCULAR LENS PLACEMENT (IOC);  Surgeon: Tonny Branch;  Location: [facility];  Service: Ophthalmology;  Laterality: Left;  CDE 14.34  . COLONOSCOPY  06/23/08  . COLONOSCOPY N/A 01/18/2014   Procedure: COLONOSCOPY;  Surgeon: Rogene Houston, MD;  Location: [facility];  Service: Endoscopy;  Laterality: N/A;  145  . CORONARY ANGIOPLASTY WITH STENT PLACEMENT    . CORONARY ATHERECTOMY N/A 09/21/2017   Procedure: CORONARY ATHERECTOMY;  Surgeon: Nigel Mormon, MD;   Location: [place] [facility];  Service: Cardiovascular;  Laterality: N/A;  . CORONARY STENT INTERVENTION N/A 08/31/2017   Procedure: CORONARY STENT INTERVENTION;  Surgeon: Nigel Mormon, MD;  Location: [place] [facility];  Service: Cardiovascular;  Laterality: N/A;  . CORONARY STENT INTERVENTION N/A 09/21/2017   Procedure: CORONARY STENT INTERVENTION;  Surgeon: Nigel Mormon, MD;  Location: [place] [facility];  Service: Cardiovascular;  Laterality: N/A;  . EYE SURGERY Bilateral    "after cataract OR"  . INTRAVASCULAR ULTRASOUND/IVUS N/A 09/21/2017   Procedure: Intravascular Ultrasound/IVUS;  Surgeon: Nigel Mormon, MD;  Location: [place] [facility];  Service: Cardiovascular;  Laterality: N/A;  . RIGHT/LEFT HEART CATH AND CORONARY ANGIOGRAPHY N/A 08/31/2017   Procedure: RIGHT/LEFT HEART CATH AND CORONARY ANGIOGRAPHY;  Surgeon: Nigel Mormon, MD;  Location: [place] [facility];  Service: Cardiovascular;  Laterality: N/A;  . RIGHT/LEFT HEART CATH AND CORONARY ANGIOGRAPHY N/A 05/16/2019   Procedure: RIGHT/LEFT HEART CATH AND CORONARY ANGIOGRAPHY;  Surgeon: Nigel Mormon, MD;  Location: [place] [facility];  Service: Cardiovascular;  Laterality: N/A;  . TEE WITHOUT CARDIOVERSION N/A 06/06/2019   Procedure: TRANSESOPHAGEAL ECHOCARDIOGRAM (TEE);  Surgeon: Sherren Mocha, MD;  Location: [place] [facility];  Service: Open Heart Surgery;  Laterality: N/A;  . TRANSCATHETER AORTIC VALVE REPLACEMENT, TRANSFEMORAL N/A 06/06/2019   Procedure: TRANSCATHETER AORTIC VALVE REPLACEMENT, TRANSFEMORAL;  Surgeon: Sherren Mocha, MD;  Location: [place] [facility];  Service: Open Heart Surgery;  Laterality: N/A;  . ULTRASOUND GUIDANCE FOR VASCULAR ACCESS  09/21/2017   Procedure: Ultrasound Guidance For Vascular Access;  Surgeon: Nigel Mormon, MD;  Location: [place] [facility];  Service: Cardiovascular;;  . VARICOSE VEIN SURGERY Bilateral 1960s   [facility]     Social History    Socioeconomic History  . Marital status: Widowed    Spouse name: Not on file  . Number of children: 5  . Years of education: Not on file  . Highest education level: Not on file  Occupational History  . Not on file  Social Needs  . Financial resource strain: Not on file  . Food insecurity    Worry: Not on file    Inability: Not on file  . Transportation needs    Medical: Not on file    Non-medical: Not on file  Tobacco Use  . Smoking status: Never Smoker  . Smokeless tobacco: Never Used  Substance and Sexual Activity  . Alcohol use: No  . Drug use: No  . Sexual activity: Not on file  Lifestyle  . Physical activity    Days per week: Not on file    Minutes per session: Not on file  . Stress: Not on file  Relationships  . Social Herbalist on phone: Not on file    Gets together: Not on file    Attends religious service: Not on file    Active member of club or organization: Not on file    Attends meetings of clubs or organizations: Not on file    Relationship status: Not on file  . Intimate partner violence    Fear of current or ex partner: Not on file    Emotionally abused: Not on file    Physically abused: Not on file    Forced sexual activity: Not on file  Other Topics Concern  . Not on file  Social History Narrative   1 son deceased     No family history on file.   Current Outpatient Medications on File Prior to Visit  Medication Sig Dispense Refill  . amLODipine (NORVASC) 10 MG tablet Take 1 tablet (10 mg total) by mouth daily. 90 tablet 3  . amoxicillin (AMOXIL) 500 MG capsule Take 4 capsules 1 hour prior to any dental procedures 4 capsule 3  . aspirin 81 MG chewable tablet Chew 1 tablet (81 mg total) by mouth daily.    . diphenhydrAMINE (BENADRYL) 25 MG tablet Take 25 mg by mouth daily as needed for allergies.    Marland Kitchen ELIQUIS 2.5 MG TABS tablet TAKE (1) TABLET BY MOUTH TWICE DAILY. 180 tablet 3  . lisinopril (PRINIVIL,ZESTRIL) 40 MG tablet Take 40  mg by mouth daily.     . metoprolol tartrate (LOPRESSOR) 25 MG tablet Take 0.5 tablets (12.5 mg total) by mouth 2 (two) times daily. 30 tablet 2  . nitroGLYCERIN (NITROSTAT) 0.4 MG SL tablet Place 0.4 mg under the tongue every 5 (five) minutes x 3 doses as needed for chest pain.    Marland Kitchen spironolactone (ALDACTONE) 25 MG tablet Take 25 mg by mouth daily.     No current facility-administered medications on file prior to visit.     Cardiovascular studies:  EKG 09/01/2019: Sinus rhythm 76 bpm. Frequent pvcs -ventricular bigeminy  No significant change compared to previous EKG.  Echocardiogram 06/28/2019:  1. The left ventricle has hyperdynamic systolic function, with an ejection fraction of >65%. The cavity size was normal.  There is moderately increased left ventricular wall thickness. Left ventricular diastolic Doppler parameters are consistent with  impaired relaxation. Indeterminate filling pressures The E/e' is 8-15. No evidence of left ventricular regional wall motion abnormalities.  2. The right ventricle has normal systolic function. The cavity was normal. There is no increase in right ventricular wall thickness.  3. The mitral valve is abnormal. Mild thickening of the mitral valve leaflet. Mild calcification of the mitral valve leaflet. There is severe mitral annular calcification present.  4. The tricuspid valve is grossly normal. Tricuspid valve regurgitation is moderate.  5. A 77m an EWende CreaseSapien bioprosthetic aortic valve (TAVR) valve is present in the aortic position. Procedure Date: 06/06/2019 Normal aortic valve prosthesis.  6. The aortic valve is tricuspid. Mild sclerosis of the aortic valve. Mild stenosis of the aortic valve.  7. The aorta is normal in size and structure.  8. When compared to the prior study: 06/07/2019: LVEF 60-65%, AOV Mean gradient 4.5 mmHg.  TAVR 06/06/2019: 23 mm Edwards Sapien bioprosthetic aortic valve (Dr. CBurt Knack  [place]7/05/2019: Right: Patent  common femoral, superficial femoral, and popliteal arteries. Monophasic waveforms are detected distal to the tibioperoneal trunk.  Carotid artery duplex 12/28/2017: Minimal stenosis in the right internal carotid artery (1-15%). Left vertebral artery waveform is biphasic and suggests proximal left subclavian artery stenosis.  Antegrade right vertebral artery flow. Further studies if clinically indicated.  Coronary atherectomy and angioplasty 09/21/2017: Successful diamondback orbital atherectomy and two-vessel PCI PTCA and overlapping drug-eluting stents placement LCx Synergy DES 2.5 x 12 mm Synergy DES 2.5 x 20 mm Synergy DES 3.0 X 20 mm PTCA and overlapping drug-eluting stents placement LAD Synergy DES 3.0 x 24 mm Synergy DES 3.5 X 8 mm  Coronary angioram, LHC/RHC, RCA PCI 09/01/2017: Moderate calcific aortic and mitral valve stenosis Severe multivessel CAD Staged PCI to mid-prox RCA Successful PTCA and stent placement Resolute Onyx 3.5 X 26 mm DES  Moderate calcific aortic and mitral valve stenosis Severe multivessel CAD Staged PCI to mid-prox RCA Successful PTCA and stent placement Resolute Onyx 3.5 X 26 mm DES  Recent labs: Results for CLATOYIA, TECSON(MRN [id number] as of 09/01/2019 11:15  Ref. Range 08/01/2019 10:15  Total CHOL/HDL Ratio Latest Ref Range: 0.0 - 4.4 ratio 4.6 (H)  Cholesterol, Total Latest Ref Range: 100 - 199 mg/dL 211 (H)  HDL Cholesterol Latest Ref Range: >39 mg/dL 46  Triglycerides Latest Ref Range: 0 - 149 mg/dL 119  VLDL Cholesterol Cal Latest Ref Range: 5 - 40 mg/dL 21  LDL Chol Calc (NIH) Latest Ref Range: 0 - 99 mg/dL 144 (H)    08/15/2018: Glucose 87. BUN/Cr 18/1.05. eGFR 48. Na/K 139/4.5  01/24/2018: Cholesterol 143, triglycerides 71, HDL 53, LDL 76. Glucose 84.  BUN/creatinine 22/0.9.  EGFR 67, sodium 137, potassium 4.3 H/H 13.2/39.9.  MCV 95.  Platelets 264   Review of Systems  Constitution: Negative for decreased appetite,  malaise/fatigue, weight gain and weight loss.  HENT: Negative for congestion.   Eyes: Negative for visual disturbance.  Cardiovascular: Positive for dyspnea on exertion (Improved). Negative for chest pain, leg swelling, palpitations and syncope.  Respiratory: Positive for shortness of breath (Improved). Negative for cough.   Endocrine: Negative for cold intolerance.  Hematologic/Lymphatic: Does not bruise/bleed easily.  Skin: Negative for itching and rash.  Musculoskeletal: Negative for myalgias.  Gastrointestinal: Negative for abdominal pain, nausea and vomiting.  Genitourinary: Negative for dysuria.  Neurological: Negative for dizziness and weakness.  Psychiatric/Behavioral: The patient  is not nervous/anxious.   All other systems reviewed and are negative.       Vitals:   09/01/19 1106  BP: 130/61  Pulse: (!) 44  Temp: (!) 97.5 F (36.4 C)  SpO2: 100%      Body mass index is 18.73 kg/m. Filed Weights   09/01/19 1106  Weight: 109 lb 1.6 oz (49.5 kg)     Objective:   Physical Exam  Constitutional: She is oriented to person, place, and time. She appears well-developed and well-nourished. No distress.  HENT:  Head: Normocephalic and atraumatic.  Eyes: Pupils are equal, round, and reactive to light. Conjunctivae are normal.  Neck: No JVD present.  Cardiovascular: Normal rate and regular rhythm.  Murmur heard.  Harsh midsystolic murmur is present with a grade of 1/6 at the upper right sternal border. Pulses:      Femoral pulses are 3+ on the right side and 3+ on the left side.      Popliteal pulses are 2+ on the right side and 1+ on the left side.       Dorsalis pedis pulses are 0 on the right side and 0 on the left side.       Posterior tibial pulses are 0 on the left side.  Pulmonary/Chest: Effort normal and breath sounds normal. She has no wheezes. She has no rales.  Abdominal: Soft. Bowel sounds are normal. There is no rebound.  Musculoskeletal:        General:  No edema.  Lymphadenopathy:    She has no cervical adenopathy.  Neurological: She is alert and oriented to person, place, and time. No cranial nerve deficit.  Skin: Skin is warm and dry.  Psychiatric: She has a normal mood and affect.  Nursing note and vitals reviewed.         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   S/p TAVR: Normal valve functioning. Endocarditis prophylaxis  Symptomatic PVC:  Frequent PVC's, ventricular bigeminy. I suspect her exertional dyspnea now after TAVR, is related to her PV's. Echocardiogram immediately post TAVR showed normal LVEF.Will check electrolytes. Increase metoprolol tartarate to 2 5 mg bid. Repeat echocardiogram in 1 month, along with Holter monitor.   Hyperlipidemia: Resumed rosuvastatin 20 mg daily.   Coronary artery disease involving native coronary artery of native heart without angina pectoris Stable Continue Aspirin. Will resume statin at next visit.   PAF: CHA2DS2VASc score 4, annual stroke risk 5% Conitnue eliquis 2.5 mg bid   F/u in 4 wks after repeat echocardiogram and Holter monitor.   Nigel Mormon, MD [facility] Pager: [phone] Office: [phone] If no answer Cell [phone]

## 2019-09-02 LAB — MAGNESIUM: Magnesium: 2.2 mg/dL (ref 1.6–2.3)

## 2019-10-02 ENCOUNTER — Ambulatory Visit (INDEPENDENT_AMBULATORY_CARE_PROVIDER_SITE_OTHER): Payer: Medicare Other

## 2019-10-02 ENCOUNTER — Ambulatory Visit: Payer: Medicare Other

## 2019-10-02 ENCOUNTER — Other Ambulatory Visit: Payer: Self-pay

## 2019-10-02 DIAGNOSIS — I10 Essential (primary) hypertension: Secondary | ICD-10-CM | POA: Diagnosis not present

## 2019-10-02 DIAGNOSIS — I493 Ventricular premature depolarization: Secondary | ICD-10-CM

## 2019-10-03 DIAGNOSIS — R002 Palpitations: Secondary | ICD-10-CM | POA: Diagnosis not present

## 2019-10-04 NOTE — Progress Notes (Signed)
Follow up visit  Subjective:   Catherine Anthony, female    DOB: 02-08-1930, 85 y.o.   MRN: [id number]   Chief Complaint  Patient presents with   Hypertension    follow up results   Aortic Stenosis     HPI  85 year old Caucasian female with hypertension, CAD s/p multivessel PCI (09/2017), s/p 23 mm Edwards Sapien TAVR (05/2019), paroxysmal Afib, frequent PVC's.   Recent Holter monitor showed predominantly unifocal PVC's with 30% PVC burden. She continues to have mild exertional dyspnea.   Past Medical History:  Diagnosis Date   Arthritis    Right hand   Complication of anesthesia    Coronary artery disease    s/p mutliple PCIs   Family hx of colon cancer    High cholesterol    History of kidney stones    Hypertension    PAF (paroxysmal atrial fibrillation) (HCC)    on Eliquis   S/P TAVR (transcatheter aortic valve replacement)    Severe aortic stenosis      Past Surgical History:  Procedure Laterality Date   CATARACT EXTRACTION W/ INTRAOCULAR LENS IMPLANT Right 06/04/2011   [facility]   CATARACT EXTRACTION W/PHACO  06/22/2011   Procedure: CATARACT EXTRACTION PHACO AND INTRAOCULAR LENS PLACEMENT ([place]);  Surgeon: Tonny Branch;  Location: [facility];  Service: Ophthalmology;  Laterality: Left;  CDE 14.34   COLONOSCOPY  06/23/08   COLONOSCOPY N/A 01/18/2014   Procedure: COLONOSCOPY;  Surgeon: Rogene Houston, MD;  Location: [facility];  Service: Endoscopy;  Laterality: N/A;  145   CORONARY ANGIOPLASTY WITH STENT PLACEMENT     CORONARY ATHERECTOMY N/A 09/21/2017   Procedure: CORONARY ATHERECTOMY;  Surgeon: Nigel Mormon, MD;  Location: [place] [facility];  Service: Cardiovascular;  Laterality: N/A;   CORONARY STENT INTERVENTION N/A 08/31/2017   Procedure: CORONARY STENT INTERVENTION;  Surgeon: Nigel Mormon, MD;  Location: [place] [facility];  Service: Cardiovascular;  Laterality: N/A;   CORONARY STENT INTERVENTION N/A 09/21/2017   Procedure:  CORONARY STENT INTERVENTION;  Surgeon: Nigel Mormon, MD;  Location: [place] [facility];  Service: Cardiovascular;  Laterality: N/A;   EYE SURGERY Bilateral    "after cataract OR"   INTRAVASCULAR ULTRASOUND/IVUS N/A 09/21/2017   Procedure: Intravascular Ultrasound/IVUS;  Surgeon: Nigel Mormon, MD;  Location: [place] [facility];  Service: Cardiovascular;  Laterality: N/A;   RIGHT/LEFT HEART CATH AND CORONARY ANGIOGRAPHY N/A 08/31/2017   Procedure: RIGHT/LEFT HEART CATH AND CORONARY ANGIOGRAPHY;  Surgeon: Nigel Mormon, MD;  Location: [place] [facility];  Service: Cardiovascular;  Laterality: N/A;   RIGHT/LEFT HEART CATH AND CORONARY ANGIOGRAPHY N/A 05/16/2019   Procedure: RIGHT/LEFT HEART CATH AND CORONARY ANGIOGRAPHY;  Surgeon: Nigel Mormon, MD;  Location: [place] [facility];  Service: Cardiovascular;  Laterality: N/A;   TEE WITHOUT CARDIOVERSION N/A 06/06/2019   Procedure: TRANSESOPHAGEAL ECHOCARDIOGRAM (TEE);  Surgeon: Sherren Mocha, MD;  Location: [place] [facility];  Service: Open Heart Surgery;  Laterality: N/A;   TRANSCATHETER AORTIC VALVE REPLACEMENT, TRANSFEMORAL N/A 06/06/2019   Procedure: TRANSCATHETER AORTIC VALVE REPLACEMENT, TRANSFEMORAL;  Surgeon: Sherren Mocha, MD;  Location: [place] [facility];  Service: Open Heart Surgery;  Laterality: N/A;   ULTRASOUND GUIDANCE FOR VASCULAR ACCESS  09/21/2017   Procedure: Ultrasound Guidance For Vascular Access;  Surgeon: Nigel Mormon, MD;  Location: [place] [facility];  Service: Cardiovascular;;   VARICOSE VEIN SURGERY Bilateral 1960s   [facility]     Social History  Socioeconomic History   Marital status: Widowed    Spouse name: Not on file   Number of children: 5   Years of education: Not on file   Highest education level: Not on file  Occupational History   Not on file  Social Needs   Financial resource strain: Not on file   Food insecurity    Worry: Not on file    Inability: Not  on file   Transportation needs    Medical: Not on file    Non-medical: Not on file  Tobacco Use   Smoking status: Never Smoker   Smokeless tobacco: Never Used  Substance and Sexual Activity   Alcohol use: No   Drug use: No   Sexual activity: Not on file  Lifestyle   Physical activity    Days per week: Not on file    Minutes per session: Not on file   Stress: Not on file  Relationships   Social connections    Talks on phone: Not on file    Gets together: Not on file    Attends religious service: Not on file    Active member of club or organization: Not on file    Attends meetings of clubs or organizations: Not on file    Relationship status: Not on file   Intimate partner violence    Fear of current or ex partner: Not on file    Emotionally abused: Not on file    Physically abused: Not on file    Forced sexual activity: Not on file  Other Topics Concern   Not on file  Social History Narrative   1 son deceased     No family history on file.   Current Outpatient Medications on File Prior to Visit  Medication Sig Dispense Refill   amLODipine (NORVASC) 10 MG tablet Take 1 tablet (10 mg total) by mouth daily. 90 tablet 3   amoxicillin (AMOXIL) 500 MG capsule Take 4 capsules 1 hour prior to any dental procedures 4 capsule 3   aspirin 81 MG chewable tablet Chew 1 tablet (81 mg total) by mouth daily.     diphenhydrAMINE (BENADRYL) 25 MG tablet Take 25 mg by mouth daily as needed for allergies.     ELIQUIS 2.5 MG TABS tablet TAKE (1) TABLET BY MOUTH TWICE DAILY. 180 tablet 3   lisinopril (PRINIVIL,ZESTRIL) 40 MG tablet Take 40 mg by mouth daily.      metoprolol tartrate (LOPRESSOR) 25 MG tablet Take 1 tablet (25 mg total) by mouth 2 (two) times daily. 120 tablet 2   nitroGLYCERIN (NITROSTAT) 0.4 MG SL tablet Place 0.4 mg under the tongue every 5 (five) minutes x 3 doses as needed for chest pain.     rosuvastatin (CRESTOR) 20 MG tablet Take 1 tablet (20 mg  total) by mouth daily. 60 tablet 3   No current facility-administered medications on file prior to visit.     Cardiovascular studies:  EKG 10/16/2019: Sinus rhythm 77 bpm. Frequent multiform ectopic ventricular beats  Nonspecific T-abnormality.   Echocardiogram 10/02/2019: Left ventricle cavity is normal in size. Mild concentric hypertrophy of the left ventricle. Normal LV systolic function with EF 58%. Normal global wall motion. Frequent PVC's seen.  Doppler evidence of grade I (impaired) diastolic dysfunction, normal LAP. Calculated EF 58%. Right atrial cavity is mildly dilated. Well seated and normal functioning bioprosthetic TAVR aortic valve. Mean PG 6 mmHg. No aortic valve regurgitation noted. Mild calcification of the mitral valve annulus. Mild to moderate  mitral regurgitation. Moderate to severe tricuspid regurgitation. Estimated pulmonary artery systolic pressure is 40 mmHg.  No significant change compared to previous study on 06/28/2019.  EKG 09/01/2019: Sinus rhythm 76 bpm. Frequent pvcs -ventricular bigeminy  No significant change compared to previous EKG.  Echocardiogram 06/28/2019:  1. The left ventricle has hyperdynamic systolic function, with an ejection fraction of >65%. The cavity size was normal. There is moderately increased left ventricular wall thickness. Left ventricular diastolic Doppler parameters are consistent with  impaired relaxation. Indeterminate filling pressures The E/e' is 8-15. No evidence of left ventricular regional wall motion abnormalities.  2. The right ventricle has normal systolic function. The cavity was normal. There is no increase in right ventricular wall thickness.  3. The mitral valve is abnormal. Mild thickening of the mitral valve leaflet. Mild calcification of the mitral valve leaflet. There is severe mitral annular calcification present.  4. The tricuspid valve is grossly normal. Tricuspid valve regurgitation is moderate.  5. A 61m an  EWende CreaseSapien bioprosthetic aortic valve (TAVR) valve is present in the aortic position. Procedure Date: 06/06/2019 Normal aortic valve prosthesis.  6. The aortic valve is tricuspid. Mild sclerosis of the aortic valve. Mild stenosis of the aortic valve.  7. The aorta is normal in size and structure.  8. When compared to the prior study: 06/07/2019: LVEF 60-65%, AOV Mean gradient 4.5 mmHg.  TAVR 06/06/2019: 23 mm Edwards Sapien bioprosthetic aortic valve (Dr. CBurt Knack  [place]7/05/2019: Right: Patent common femoral, superficial femoral, and popliteal arteries. Monophasic waveforms are detected distal to the tibioperoneal trunk.  Carotid artery duplex 12/28/2017: Minimal stenosis in the right internal carotid artery (1-15%). Left vertebral artery waveform is biphasic and suggests proximal left subclavian artery stenosis.  Antegrade right vertebral artery flow. Further studies if clinically indicated.  Coronary atherectomy and angioplasty 09/21/2017: Successful diamondback orbital atherectomy and two-vessel PCI PTCA and overlapping drug-eluting stents placement LCx Synergy DES 2.5 x 12 mm Synergy DES 2.5 x 20 mm Synergy DES 3.0 X 20 mm PTCA and overlapping drug-eluting stents placement LAD Synergy DES 3.0 x 24 mm Synergy DES 3.5 X 8 mm  Coronary angioram, LHC/RHC, RCA PCI 09/01/2017: Moderate calcific aortic and mitral valve stenosis Severe multivessel CAD Staged PCI to mid-prox RCA Successful PTCA and stent placement Resolute Onyx 3.5 X 26 mm DES  Moderate calcific aortic and mitral valve stenosis Severe multivessel CAD Staged PCI to mid-prox RCA Successful PTCA and stent placement Resolute Onyx 3.5 X 26 mm DES  Recent labs: Results for CMARTENA, EMANUELE(MRN [id number] as of 09/01/2019 11:15  Ref. Range 08/01/2019 10:15  Total CHOL/HDL Ratio Latest Ref Range: 0.0 - 4.4 ratio 4.6 (H)  Cholesterol, Total Latest Ref Range: 100 - 199 mg/dL 211 (H)  HDL Cholesterol Latest Ref Range:  >39 mg/dL 46  Triglycerides Latest Ref Range: 0 - 149 mg/dL 119  VLDL Cholesterol Cal Latest Ref Range: 5 - 40 mg/dL 21  LDL Chol Calc (NIH) Latest Ref Range: 0 - 99 mg/dL 144 (H)    08/15/2018: Glucose 87. BUN/Cr 18/1.05. eGFR 48. Na/K 139/4.5  01/24/2018: Cholesterol 143, triglycerides 71, HDL 53, LDL 76. Glucose 84.  BUN/creatinine 22/0.9.  EGFR 67, sodium 137, potassium 4.3 H/H 13.2/39.9.  MCV 95.  Platelets 264   Review of Systems  Constitution: Negative for decreased appetite, malaise/fatigue, weight gain and weight loss.  HENT: Negative for congestion.   Eyes: Negative for visual disturbance.  Cardiovascular: Positive for dyspnea on exertion (Improved). Negative for chest  pain, leg swelling, palpitations and syncope.  Respiratory: Positive for shortness of breath (Improved). Negative for cough.   Endocrine: Negative for cold intolerance.  Hematologic/Lymphatic: Does not bruise/bleed easily.  Skin: Negative for itching and rash.  Musculoskeletal: Negative for myalgias.  Gastrointestinal: Negative for abdominal pain, nausea and vomiting.  Genitourinary: Negative for dysuria.  Neurological: Negative for dizziness and weakness.  Psychiatric/Behavioral: The patient is not nervous/anxious.   All other systems reviewed and are negative.        Vitals:   10/16/19 1334  BP: 117/60  Pulse: (!) 58  Temp: (!) 97.1 F (36.2 C)  SpO2: 98%     Body mass index is 18.71 kg/m. Filed Weights   10/16/19 1334  Weight: 109 lb (49.4 kg)     Objective:   Physical Exam  Constitutional: She is oriented to person, place, and time. She appears well-developed and well-nourished. No distress.  HENT:  Head: Normocephalic and atraumatic.  Eyes: Pupils are equal, round, and reactive to light. Conjunctivae are normal.  Neck: No JVD present.  Cardiovascular: Normal rate and regular rhythm. Frequent extrasystoles are present.  Murmur heard.  Harsh midsystolic murmur is present with a  grade of 1/6 at the upper right sternal border. Pulses:      Femoral pulses are 3+ on the right side and 3+ on the left side.      Popliteal pulses are 2+ on the right side and 1+ on the left side.       Dorsalis pedis pulses are 0 on the right side and 0 on the left side.       Posterior tibial pulses are 0 on the left side.  Pulmonary/Chest: Effort normal and breath sounds normal. She has no wheezes. She has no rales.  Abdominal: Soft. Bowel sounds are normal. There is no rebound.  Musculoskeletal:        General: No edema.  Lymphadenopathy:    She has no cervical adenopathy.  Neurological: She is alert and oriented to person, place, and time. No cranial nerve deficit.  Skin: Skin is warm and dry.  Psychiatric: She has a normal mood and affect.  Nursing note and vitals reviewed.         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   Symptomatic PVC:  30% PVC burden.  Discussed medical vs invasive therapy. Will start amiodarone 200 mg bid. Will likely reduce to 200 mg daily when I see her f/u in 8 weeks.  Risks/benefits discussed with the patient and daughter Tessie Fass. Check baseline TSH. Continue regular f/u w/opthalmologist.   S/p TAVR: Normal valve functioning. Endocarditis prophylaxis  Hyperlipidemia: Resumed rosuvastatin 20 mg daily.   Coronary artery disease involving native coronary artery of native heart without angina pectoris Stable Continue Aspirin, rosuvastatin. No bleeding issues noted.   PAF: CHA2DS2VASc score 4, annual stroke risk 5% Conitnue eliquis 2.5 mg bid   F/u in 4 wks after repeat echocardiogram and Holter monitor.   Nigel Mormon, MD [facility] [place] Pager: [phone] Office: [phone] If no answer Cell [phone]

## 2019-10-09 ENCOUNTER — Ambulatory Visit: Payer: Medicare Other | Admitting: Cardiology

## 2019-10-16 ENCOUNTER — Ambulatory Visit (INDEPENDENT_AMBULATORY_CARE_PROVIDER_SITE_OTHER): Payer: Medicare Other | Admitting: Cardiology

## 2019-10-16 ENCOUNTER — Other Ambulatory Visit: Payer: Self-pay

## 2019-10-16 ENCOUNTER — Encounter: Payer: Self-pay | Admitting: Cardiology

## 2019-10-16 VITALS — BP 117/60 | HR 58 | Temp 97.1°F | Ht 64.0 in | Wt 109.0 lb

## 2019-10-16 DIAGNOSIS — Z952 Presence of prosthetic heart valve: Secondary | ICD-10-CM

## 2019-10-16 DIAGNOSIS — I2510 Atherosclerotic heart disease of native coronary artery without angina pectoris: Secondary | ICD-10-CM | POA: Diagnosis not present

## 2019-10-16 DIAGNOSIS — Z5181 Encounter for therapeutic drug level monitoring: Secondary | ICD-10-CM

## 2019-10-16 DIAGNOSIS — I493 Ventricular premature depolarization: Secondary | ICD-10-CM | POA: Diagnosis not present

## 2019-10-16 DIAGNOSIS — Z1329 Encounter for screening for other suspected endocrine disorder: Secondary | ICD-10-CM

## 2019-10-16 MED ORDER — AMIODARONE HCL 200 MG PO TABS: 200.0000 mg | ORAL_TABLET | Freq: Every day | ORAL | 3 refills | Status: DC

## 2019-10-18 ENCOUNTER — Telehealth: Payer: Self-pay

## 2019-10-18 ENCOUNTER — Other Ambulatory Visit: Payer: Self-pay

## 2019-10-18 DIAGNOSIS — I493 Ventricular premature depolarization: Secondary | ICD-10-CM

## 2019-10-18 MED ORDER — AMIODARONE HCL 200 MG PO TABS: 200.0000 mg | ORAL_TABLET | Freq: Two times a day (BID) | ORAL | 3 refills | Status: DC

## 2019-10-18 NOTE — Telephone Encounter (Signed)
You wanted pt to start amiodarone once or twice daily. Bottle says once daily but 60 pills were sent, please advise.

## 2019-10-18 NOTE — Telephone Encounter (Signed)
S/w pts daughter, advised her Amiodarone 200 BID

## 2019-10-18 NOTE — Telephone Encounter (Signed)
Supposed to be twice daily. I see that I sent it as daily. Can you let the patient know and send a new prescription of twice daily 60 pills please?  Thanks MJP

## 2019-10-25 DIAGNOSIS — R5383 Other fatigue: Secondary | ICD-10-CM | POA: Diagnosis not present

## 2019-10-25 DIAGNOSIS — M810 Age-related osteoporosis without current pathological fracture: Secondary | ICD-10-CM | POA: Diagnosis not present

## 2019-10-25 DIAGNOSIS — Z79899 Other long term (current) drug therapy: Secondary | ICD-10-CM | POA: Diagnosis not present

## 2019-10-25 DIAGNOSIS — I4891 Unspecified atrial fibrillation: Secondary | ICD-10-CM | POA: Diagnosis not present

## 2019-10-25 DIAGNOSIS — I10 Essential (primary) hypertension: Secondary | ICD-10-CM | POA: Diagnosis not present

## 2019-10-25 DIAGNOSIS — I2510 Atherosclerotic heart disease of native coronary artery without angina pectoris: Secondary | ICD-10-CM | POA: Diagnosis not present

## 2019-10-31 ENCOUNTER — Encounter: Payer: Self-pay | Admitting: Cardiology

## 2019-11-01 DIAGNOSIS — M810 Age-related osteoporosis without current pathological fracture: Secondary | ICD-10-CM | POA: Diagnosis not present

## 2019-11-01 DIAGNOSIS — I10 Essential (primary) hypertension: Secondary | ICD-10-CM | POA: Diagnosis not present

## 2019-11-01 DIAGNOSIS — Z79899 Other long term (current) drug therapy: Secondary | ICD-10-CM | POA: Diagnosis not present

## 2019-11-01 DIAGNOSIS — I493 Ventricular premature depolarization: Secondary | ICD-10-CM | POA: Diagnosis not present

## 2019-11-01 DIAGNOSIS — Z952 Presence of prosthetic heart valve: Secondary | ICD-10-CM | POA: Diagnosis not present

## 2019-11-01 DIAGNOSIS — I2510 Atherosclerotic heart disease of native coronary artery without angina pectoris: Secondary | ICD-10-CM | POA: Diagnosis not present

## 2019-11-01 DIAGNOSIS — I4891 Unspecified atrial fibrillation: Secondary | ICD-10-CM | POA: Diagnosis not present

## 2019-11-02 ENCOUNTER — Telehealth: Payer: Self-pay

## 2019-11-02 NOTE — Telephone Encounter (Signed)
Pt aware. Spoke with JGravely and she will request ov note.//ah

## 2019-11-02 NOTE — Telephone Encounter (Signed)
Okay with me. I will follow up at the following OV. Please ask Dr. Pennie Banter office to forward the office visit note to me.  Thanks MJP

## 2019-11-02 NOTE — Telephone Encounter (Signed)
Telephone encounter  Reason for call: Tessie Fass, daughter called bc pt was seen in Dr. Pennie Banter office yesterday by one of the other providers and was taken off of spironolactone. [name] is concerned and wanted to check with you to see if that was ok since you put her on it. Pt had recent labs so [name] is not sure if decision was based off labs or not.  Usual provider: [facility]  Last office visit: 10/16/19  Next office visit: 12/11/19   Last hospitalization: 06/06/19   Current Outpatient Medications on File Prior to Visit  Medication Sig Dispense Refill  . amiodarone (PACERONE) 200 MG tablet Take 1 tablet (200 mg total) by mouth 2 (two) times daily. 60 tablet 3  . amLODipine (NORVASC) 10 MG tablet Take 1 tablet (10 mg total) by mouth daily. 90 tablet 3  . amoxicillin (AMOXIL) 500 MG capsule Take 4 capsules 1 hour prior to any dental procedures 4 capsule 3  . diphenhydrAMINE (BENADRYL) 25 MG tablet Take 25 mg by mouth daily as needed for allergies.    Marland Kitchen ELIQUIS 2.5 MG TABS tablet TAKE (1) TABLET BY MOUTH TWICE DAILY. 180 tablet 3  . lisinopril (PRINIVIL,ZESTRIL) 40 MG tablet Take 40 mg by mouth daily.     . metoprolol tartrate (LOPRESSOR) 25 MG tablet Take 1 tablet (25 mg total) by mouth 2 (two) times daily. 120 tablet 2  . nitroGLYCERIN (NITROSTAT) 0.4 MG SL tablet Place 0.4 mg under the tongue every 5 (five) minutes x 3 doses as needed for chest pain.    . rosuvastatin (CRESTOR) 20 MG tablet Take 1 tablet (20 mg total) by mouth daily. 60 tablet 3   No current facility-administered medications on file prior to visit.

## 2019-12-11 ENCOUNTER — Other Ambulatory Visit: Payer: Self-pay

## 2019-12-11 ENCOUNTER — Ambulatory Visit (INDEPENDENT_AMBULATORY_CARE_PROVIDER_SITE_OTHER): Payer: Medicare Other | Admitting: Cardiology

## 2019-12-11 ENCOUNTER — Encounter: Payer: Self-pay | Admitting: Cardiology

## 2019-12-11 VITALS — BP 124/60 | HR 60 | Temp 97.2°F | Resp 18 | Ht 64.0 in | Wt 111.2 lb

## 2019-12-11 DIAGNOSIS — Z952 Presence of prosthetic heart valve: Secondary | ICD-10-CM | POA: Diagnosis not present

## 2019-12-11 DIAGNOSIS — I2510 Atherosclerotic heart disease of native coronary artery without angina pectoris: Secondary | ICD-10-CM | POA: Diagnosis not present

## 2019-12-11 DIAGNOSIS — R600 Localized edema: Secondary | ICD-10-CM

## 2019-12-11 DIAGNOSIS — I493 Ventricular premature depolarization: Secondary | ICD-10-CM

## 2019-12-11 MED ORDER — AMIODARONE HCL 200 MG PO TABS: 200.0000 mg | ORAL_TABLET | Freq: Every day | ORAL | 3 refills | Status: DC

## 2019-12-11 MED ORDER — FUROSEMIDE 20 MG PO TABS: 20.0000 mg | ORAL_TABLET | Freq: Every day | ORAL | 1 refills | Status: DC

## 2019-12-11 NOTE — Progress Notes (Addendum)
Follow up visit  Subjective:   Catherine Anthony, female    DOB: 05-04-1930, 86 y.o.   MRN: [id number]   Chief Complaint  Patient presents with  . Follow-up    8 wks for PVC's     HPI  86 year old Caucasian female with hypertension, CAD s/p multivessel PCI (09/2017), s/p 23 mm Edwards Sapien TAVR (05/2019), paroxysmal Afib, frequent PVC's.   Since her last visit, there is no major change in her symptomatology. She has tolerated amiodarone well. Daughter notices she has had left leg swelling. She reportedly had an injury, after which she noticed seeping fluid from her left leg.   Current Outpatient Medications on File Prior to Visit  Medication Sig Dispense Refill  . amiodarone (PACERONE) 200 MG tablet Take 1 tablet (200 mg total) by mouth 2 (two) times daily. 60 tablet 3  . amLODipine (NORVASC) 10 MG tablet Take 1 tablet (10 mg total) by mouth daily. 90 tablet 3  . amoxicillin (AMOXIL) 500 MG capsule Take 4 capsules 1 hour prior to any dental procedures 4 capsule 3  . diphenhydrAMINE (BENADRYL) 25 MG tablet Take 25 mg by mouth daily as needed for allergies.    Marland Kitchen ELIQUIS 2.5 MG TABS tablet TAKE (1) TABLET BY MOUTH TWICE DAILY. 180 tablet 3  . lisinopril (PRINIVIL,ZESTRIL) 40 MG tablet Take 40 mg by mouth daily.     . metoprolol tartrate (LOPRESSOR) 25 MG tablet Take 1 tablet (25 mg total) by mouth 2 (two) times daily. 120 tablet 2  . nitroGLYCERIN (NITROSTAT) 0.4 MG SL tablet Place 0.4 mg under the tongue every 5 (five) minutes x 3 doses as needed for chest pain.    . rosuvastatin (CRESTOR) 20 MG tablet Take 1 tablet (20 mg total) by mouth daily. 60 tablet 3   No current facility-administered medications on file prior to visit.    Cardiovascular studies:  EKG 12/11/2019: Sinus rhythm 59 bpm. First degree AV block.  24-hour Holter monitor 10/03/2019: Sinus rhythm.  Heart rate 67-129 bpm.  Average heart rate 88 bpm. 30 % ventricular ectopy in the form of PVCs, bigeminy,  trigeminy, and nonsustained ventricular tachycardia up to 7 beats.  Ventricular ectopy is predominantly unifocal. 8% supraventricular ectopy in the form of atrial couplets, bigeminy, trigeminy. No atrial flutter, atrial fibrillation, ventricular tachycardia. No sinus pauses > 3 seconds, no high-grade AV block.  Echocardiogram 10/02/2019: Left ventricle cavity is normal in size. Mild concentric hypertrophy of the left ventricle. Normal LV systolic function with EF 58%. Normal global wall motion. Frequent PVC's seen.  Doppler evidence of grade I (impaired) diastolic dysfunction, normal LAP. Calculated EF 58%. Right atrial cavity is mildly dilated. Well seated and normal functioning bioprosthetic TAVR aortic valve. Mean PG 6 mmHg. No aortic valve regurgitation noted. Mild calcification of the mitral valve annulus. Mild to moderate mitral regurgitation. Moderate to severe tricuspid regurgitation. Estimated pulmonary artery systolic pressure is 40 mmHg.  No significant change compared to previous study on 06/28/2019.  TAVR 06/06/2019: 23 mm Edwards Sapien bioprosthetic aortic valve (Dr. Burt Knack)  LE [place] 06/06/2019: Right: Patent common femoral, superficial femoral, and popliteal arteries. Monophasic waveforms are detected distal to the tibioperoneal trunk.  Carotid artery duplex 12/28/2017: Minimal stenosis in the right internal carotid artery (1-15%). Left vertebral artery waveform is biphasic and suggests proximal left subclavian artery stenosis.  Antegrade right vertebral artery flow. Further studies if clinically indicated.  Coronary atherectomy and angioplasty 09/21/2017: Successful diamondback orbital atherectomy and two-vessel PCI PTCA and overlapping drug-eluting  stents placement LCx Synergy DES 2.5 x 12 mm Synergy DES 2.5 x 20 mm Synergy DES 3.0 X 20 mm PTCA and overlapping drug-eluting stents placement LAD Synergy DES 3.0 x 24 mm Synergy DES 3.5 X 8 mm  Coronary angioram, LHC/RHC, RCA  PCI 09/01/2017: Moderate calcific aortic and mitral valve stenosis Severe multivessel CAD Staged PCI to mid-prox RCA Successful PTCA and stent placement Resolute Onyx 3.5 X 26 mm DES  Moderate calcific aortic and mitral valve stenosis Severe multivessel CAD Staged PCI to mid-prox RCA Successful PTCA and stent placement Resolute Onyx 3.5 X 26 mm DES  Recent labs: 08/01/2019: Chol 211, TG 119, HDL 46, LDL 144  08/15/2018: Glucose 87. BUN/Cr 18/1.05. eGFR 48. Na/K 139/4.5  01/24/2018: Cholesterol 143, triglycerides 71, HDL 53, LDL 76. Glucose 84.  BUN/creatinine 22/0.9.  EGFR 67, sodium 137, potassium 4.3 H/H 13.2/39.9.  MCV 95.  Platelets 264   Review of Systems  Cardiovascular: Positive for leg swelling.       Vitals:   12/11/19 1325  BP: 124/60  Pulse: 60  Resp: 18  Temp: (!) 97.2 F (36.2 C)  SpO2: 99%     Body mass index is 19.09 kg/m. Filed Weights   12/11/19 1325  Weight: 111 lb 3.2 oz (50.4 kg)     Objective:   Physical Exam  Constitutional: No distress.  Eyes: Conjunctivae are normal.  Neck: No JVD present.  Cardiovascular: Normal rate and regular rhythm. Frequent extrasystoles are present.  Murmur heard.  Harsh midsystolic murmur is present with a grade of 1/6 at the upper right sternal border. Pulses:      Femoral pulses are 3+ on the right side and 3+ on the left side.      Popliteal pulses are 2+ on the right side and 1+ on the left side.       Dorsalis pedis pulses are 0 on the right side and 0 on the left side.       Posterior tibial pulses are 0 on the left side.  Pulmonary/Chest: Effort normal and breath sounds normal. She has no wheezes. She has no rales.  Musculoskeletal:        General: No edema (LLE 2+ edema).  Nursing note and vitals reviewed.         Assessment & Recommendations:   86 year old Caucasian female with hypertension, CAD s/p multivessel PCI (09/2017), s/p 23 mm Edwards Sapien TAVR (05/2019), paroxysmal Afib, frequent  PVC's.   Left leg edema: Check [place] to look for DVT. Recommend lasix 20 mg for 7 days. Keep right leg elevated.  Symptomatic PVC:  30% PVC burden on Holter monitor 10/2019. No PVC on exam and EKG today.   Reduce amiodarone to 200 mg daily.  S/p TAVR: Normal valve functioning. Endocarditis prophylaxis Repeat echo in 05/2020  Hyperlipidemia: Resumed rosuvastatin 20 mg daily.   Coronary artery disease involving native coronary artery of native heart without angina pectoris Stable. Continue rosuvastatin. Not on Aspirin due to ongoing use of eliquis.  No bleeding issues noted.   PAF: CHA2DS2VASc score 4, annual stroke risk 5% Conitnue eliquis 2.5 mg bid   F/u in 4 wks after repeat echocardiogram and Holter monitor.   Nigel Mormon, MD [facility]. [place] Pager: [phone] Office: [phone] If no answer Cell [phone]

## 2019-12-14 ENCOUNTER — Ambulatory Visit (INDEPENDENT_AMBULATORY_CARE_PROVIDER_SITE_OTHER): Payer: Medicare Other

## 2019-12-14 ENCOUNTER — Other Ambulatory Visit: Payer: Self-pay

## 2019-12-14 ENCOUNTER — Other Ambulatory Visit: Payer: Medicare Other

## 2019-12-14 DIAGNOSIS — R600 Localized edema: Secondary | ICD-10-CM

## 2019-12-19 NOTE — Progress Notes (Signed)
Pt aware.

## 2020-01-02 IMAGING — CT CT ANGIOGRAPHY CHEST
2 of 7 series · 16 of 36 positions shown · IV contrast (omnipaque)
Comparison: No priors.

CLINICAL DATA: 88-year-old female with history of severe aortic
stenosis. Preprocedural study prior to potential transcatheter
aortic valve replacement (TAVR) procedure.

EXAM:
CT ANGIOGRAPHY CHEST, ABDOMEN AND PELVIS
TECHNIQUE: Multidetector CT imaging through the chest, abdomen and pelvis was
performed using the standard protocol during bolus administration of
intravenous contrast. Multiplanar reconstructed images and MIPs were
obtained and reviewed to evaluate the vascular anatomy.
CONTRAST:  100mL OMNIPAQUE IOHEXOL 350 MG/ML SOLN

[Series 3: ax thins · axial · 0.59mm/px · z∈[+856,+1388]mm · 15 of 599 slices shown]
[im 34/599  lung]
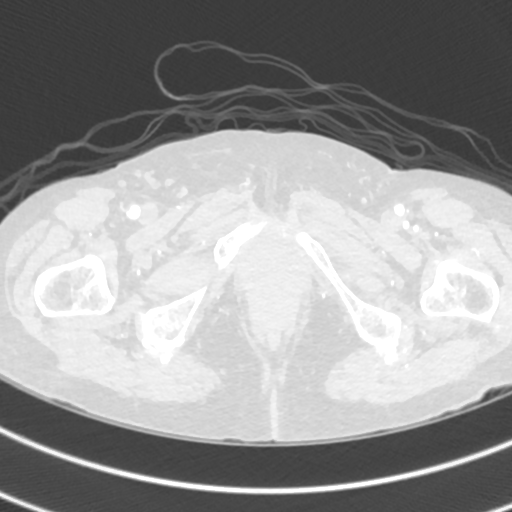
[im 67/599  mediastinal]
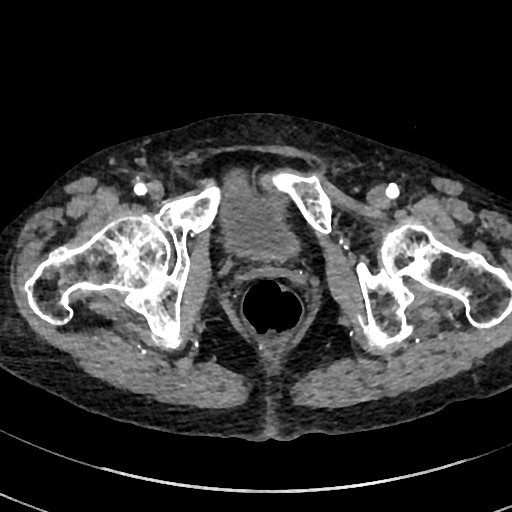
[im 100/599  lung]
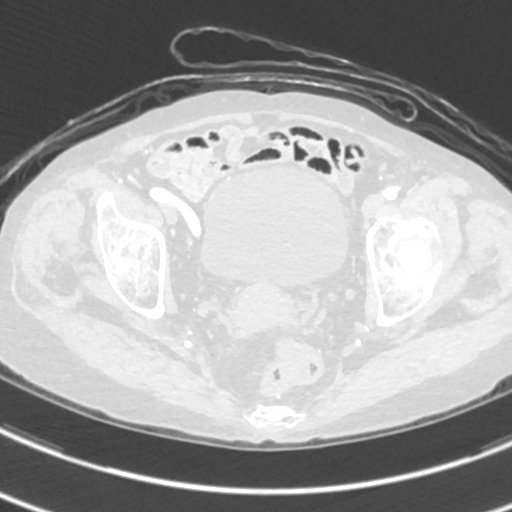
[im 133/599  mediastinal]
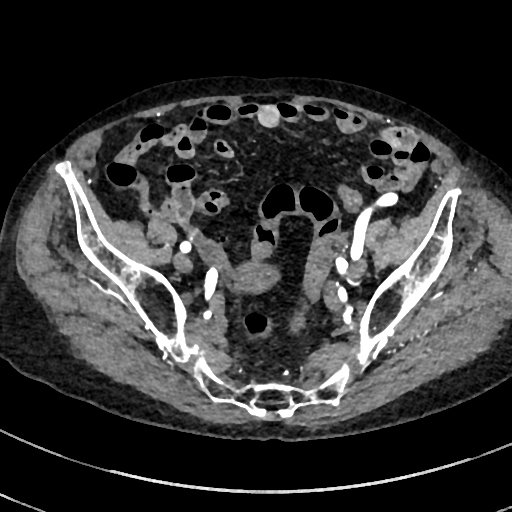
[im 200/599  lung]
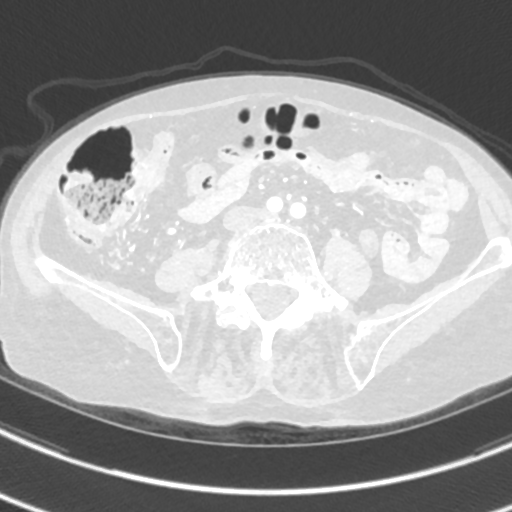
[im 233/599  mediastinal]
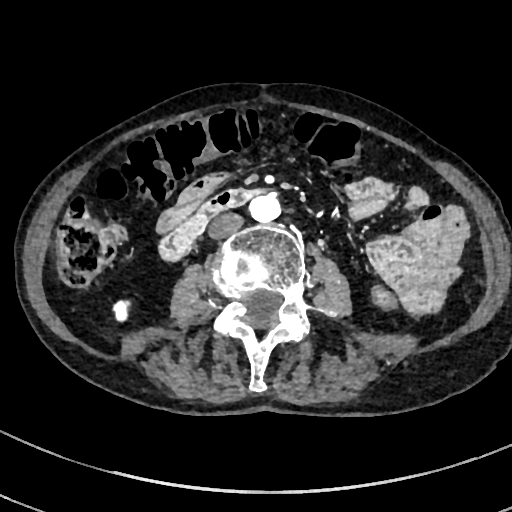
[im 266/599  lung]
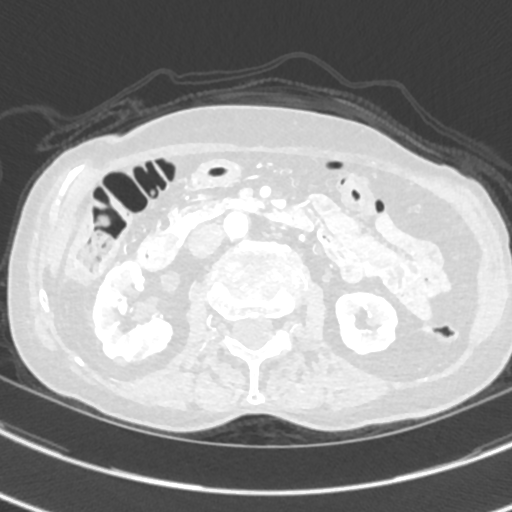
[im 300/599  mediastinal]
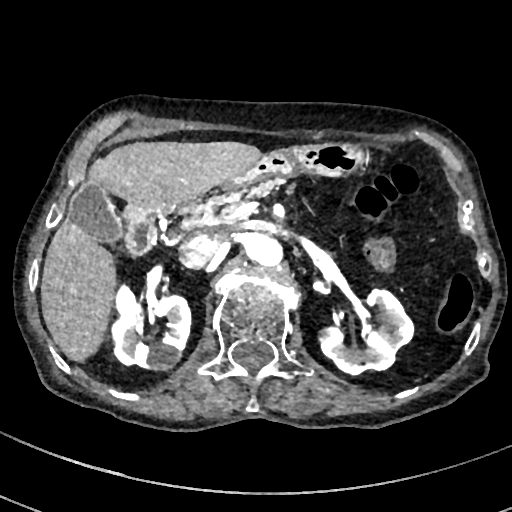
[im 333/599  lung]
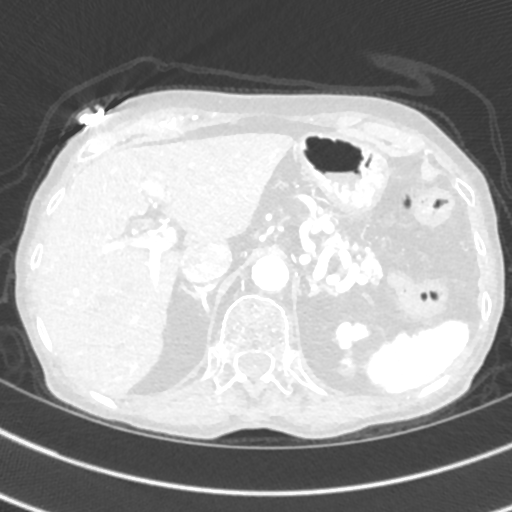
[im 366/599  mediastinal]
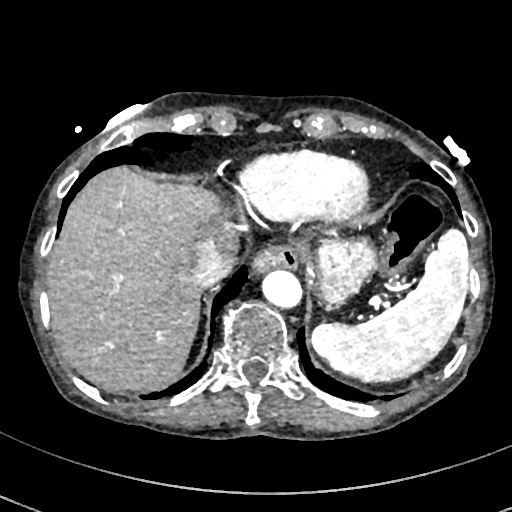
[im 399/599  lung]
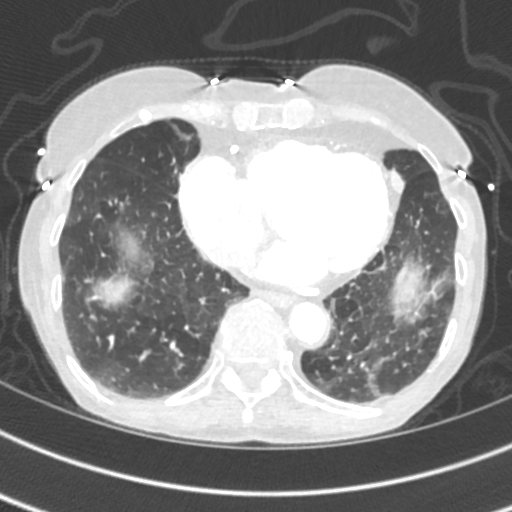
[im 466/599  mediastinal]
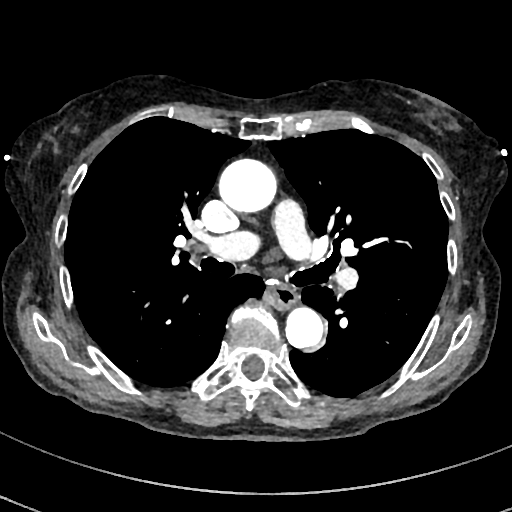
[im 499/599  lung]
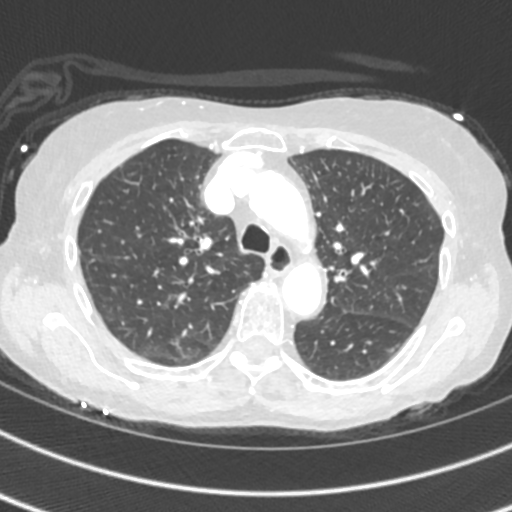
[im 532/599  mediastinal]
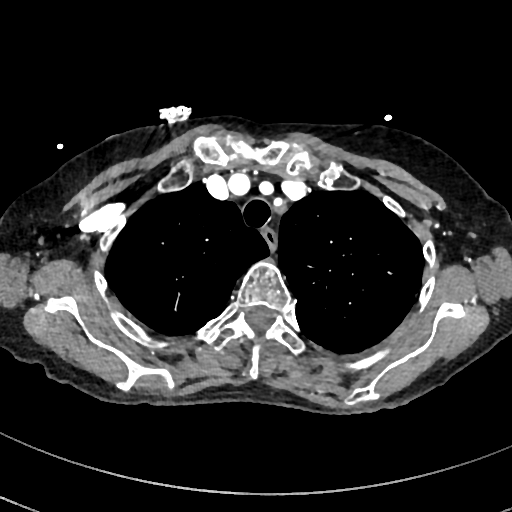
[im 565/599  lung]
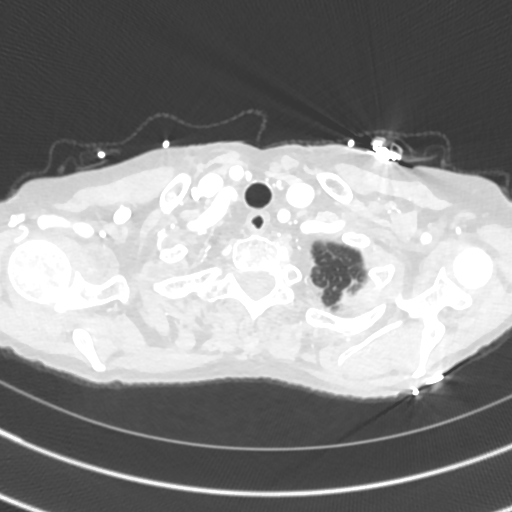

[Series 6: cor · coronal · 0.60mm/px · 1 of 141 slices shown]
[im 71/141  mediastinal]
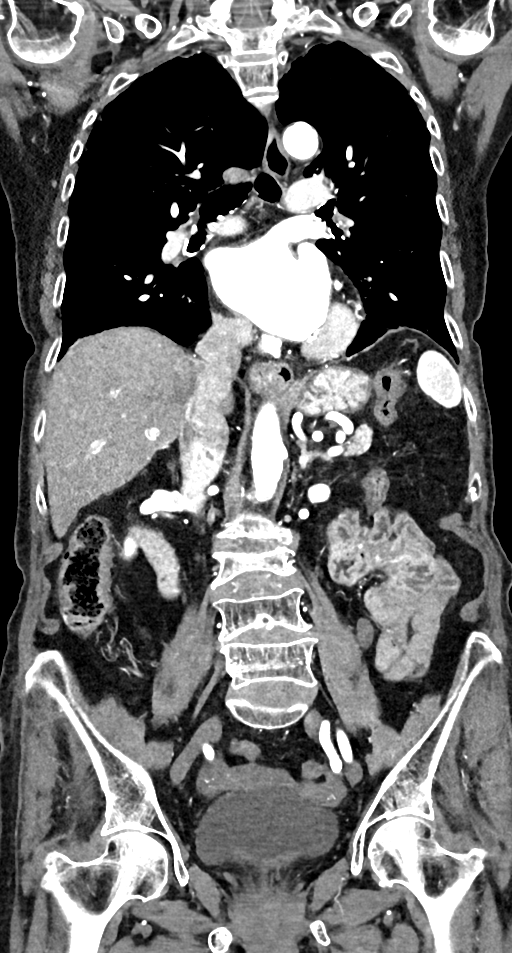

[16 of 36 positions shown; findings below may reference images not displayed]

FINDINGS: CTA CHEST FINDINGS

Cardiovascular: Heart size is borderline enlarged. There is no
significant pericardial fluid, thickening or pericardial
calcification. There is aortic atherosclerosis, as well as
atherosclerosis of the great vessels of the mediastinum and the
coronary arteries, including calcified atherosclerotic plaque in the
left main, left anterior descending, left circumflex and right
coronary arteries. Severe thickening calcification of the aortic
valve. Moderate calcifications of the mitral annulus.

Mediastinum/Lymph Nodes: No pathologically enlarged mediastinal or
hilar lymph nodes. Small hiatal hernia. No axillary lymphadenopathy.

Lungs/Pleura: No suspicious appearing pulmonary nodules or masses.
No acute consolidative airspace disease. No pleural effusions.

Musculoskeletal/Soft Tissues: There are no aggressive appearing
lytic or blastic lesions noted in the visualized portions of the
skeleton.

CTA ABDOMEN AND PELVIS FINDINGS

Hepatobiliary: No suspicious cystic or solid hepatic lesions. No
intra or extrahepatic biliary ductal dilatation. Gallbladder is
normal in appearance.

Pancreas: No pancreatic mass. No pancreatic ductal dilatation. No
pancreatic or peripancreatic fluid or inflammatory changes.

Spleen: Unremarkable.

Adrenals/Urinary Tract: Multiple low-attenuation lesions in both
kidneys, compatible with simple cysts, largest of which measure up
to 2.6 cm in the posterior aspect of the upper pole the left kidney.
Multifocal cortical thinning in both kidneys, presumably post
infectious or inflammatory scarring. Bilateral adrenal glands are
normal in appearance. There is no hydroureteronephrosis. Urinary
bladder is normal in appearance.

Stomach/Bowel: Normal appearance of the stomach. No pathologic
dilatation of small bowel or colon. A few scattered colonic
diverticulae are noted, without surrounding inflammatory changes to
suggest an acute diverticulitis at this time. Normal appendix.

Vascular/Lymphatic: Aortic atherosclerosis, without evidence of
aneurysm or dissection in the abdominal or pelvic vasculature.
Vascular findings and measurements pertinent to potential TAVR
procedure, as detailed below. No lymphadenopathy noted in the
abdomen or pelvis.

Reproductive: Uterus and ovaries are unremarkable in appearance.

Other: No significant volume of ascites.  No pneumoperitoneum.

Musculoskeletal: Chronic appearing compression fractures of L1, L2
and L3, most severe at L3 where there is up to 90% loss of central
vertebral body height. Old healed fractures of the right superior
and inferior pubic rami. There are no aggressive appearing lytic or
blastic lesions noted in the visualized portions of the skeleton.

VASCULAR MEASUREMENTS PERTINENT TO TAVR:

AORTA:

Minimal Aortic 4iameter-00 x 12 mm

Severity of Aortic Calcification-severe

RIGHT PELVIS:

Right Common Iliac Artery -

Minimal Tiameter-I.Z x 6.9 mm

Tortuosity-mild

Calcification-mild

Right External Iliac Artery -

Minimal Niameter-D.K x 6.1 mm

Tortuosity-severe

Calcification-none

Right Common Femoral Artery -

Minimal Niameter-D.K x 4.5 mm

Tortuosity-mild

Calcification-mild

LEFT PELVIS:

Left Common Iliac Artery -

Minimal Kiameter-8.B x 8.5 mm

Tortuosity-mild

Calcification-none

Left External Iliac Artery -

Minimal 8iameter-C.9 x 6.2 mm

Tortuosity-severe

Calcification-none

Left Common Femoral Artery -

Minimal Ziameter-G.G x 5.5 mm

Tortuosity-mild

Calcification-moderate

Review of the MIP images confirms the above findings.
IMPRESSION: 1. Vascular findings and measurements pertinent to potential TAVR
procedure, as detailed above.
2. Severe thickening and calcification of the aortic valve,
compatible with the reported clinical history of severe aortic
stenosis.
3. Aortic atherosclerosis, in addition to left main and 3 vessel
coronary artery disease.
4. Colonic diverticulosis without evidence of acute diverticulitis
at this time.
5. Additional incidental findings, as above.

## 2020-01-05 ENCOUNTER — Encounter: Payer: Self-pay | Admitting: Cardiology

## 2020-01-05 ENCOUNTER — Other Ambulatory Visit: Payer: Self-pay

## 2020-01-05 ENCOUNTER — Ambulatory Visit (INDEPENDENT_AMBULATORY_CARE_PROVIDER_SITE_OTHER): Payer: Medicare Other | Admitting: Cardiology

## 2020-01-05 VITALS — BP 143/74 | HR 66 | Temp 97.8°F | Ht 64.0 in | Wt 109.0 lb

## 2020-01-05 DIAGNOSIS — R600 Localized edema: Secondary | ICD-10-CM

## 2020-01-05 DIAGNOSIS — Z952 Presence of prosthetic heart valve: Secondary | ICD-10-CM

## 2020-01-05 DIAGNOSIS — I493 Ventricular premature depolarization: Secondary | ICD-10-CM

## 2020-01-05 DIAGNOSIS — I2510 Atherosclerotic heart disease of native coronary artery without angina pectoris: Secondary | ICD-10-CM | POA: Diagnosis not present

## 2020-01-05 NOTE — Progress Notes (Signed)
Follow up visit  Subjective:   Catherine Anthony, female    DOB: 1930-09-01, 86 y.o.   MRN: [id number]   Chief Complaint  Patient presents with  . premature ventricular contraction  . Coronary Artery Disease  . transcatheter aortic valve replacement  . Leg Swelling  . 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   PVCs reduced in frequency on amiodarone.  At last visit, she was noticed to have right leg swelling.  Ultrasound did not show any DVT.  Swelling is now improved.  Patient had one episode of lightheadedness after having sat in direct sunlight for long time.  Daughter mentions that she does not eat anything in the morning, and is reduced her overall intake.   Current Outpatient Medications on File Prior to Visit  Medication Sig Dispense Refill  . amiodarone (PACERONE) 200 MG tablet Take 1 tablet (200 mg total) by mouth daily. 90 tablet 3  . amLODipine (NORVASC) 10 MG tablet Take 1 tablet (10 mg total) by mouth daily. 90 tablet 3  . ELIQUIS 2.5 MG TABS tablet TAKE (1) TABLET BY MOUTH TWICE DAILY. 180 tablet 3  . furosemide (LASIX) 20 MG tablet Take 1 tablet (20 mg total) by mouth daily. 7 tablet 1  . lisinopril (PRINIVIL,ZESTRIL) 40 MG tablet Take 40 mg by mouth daily.     . metoprolol tartrate (LOPRESSOR) 25 MG tablet Take 1 tablet (25 mg total) by mouth 2 (two) times daily. 120 tablet 2  . nitroGLYCERIN (NITROSTAT) 0.4 MG SL tablet Place 0.4 mg under the tongue every 5 (five) minutes x 3 doses as needed for chest pain.    . rosuvastatin (CRESTOR) 20 MG tablet Take 1 tablet (20 mg total) by mouth daily. 60 tablet 3   No current facility-administered medications on file prior to visit.    Cardiovascular studies:  Lower Extremity Venous Duplex Right 12/14/2019:  No evidence of deep vein thrombosis of the right lower extremity  with  normal venous return.  EKG 12/11/2019: Sinus rhythm 59 bpm. First degree AV block.  24-hour Holter monitor 10/03/2019: Sinus rhythm.  Heart rate 67-129 bpm.  Average heart rate 88 bpm. 30 % ventricular ectopy in the form of PVCs, bigeminy, trigeminy, and nonsustained ventricular tachycardia up to 7 beats.  Ventricular ectopy is predominantly unifocal. 8% supraventricular ectopy in the form of atrial couplets, bigeminy, trigeminy. No atrial flutter, atrial fibrillation, ventricular tachycardia. No sinus pauses > 3 seconds, no high-grade AV block.  Echocardiogram 10/02/2019: Left ventricle cavity is normal in size. Mild concentric hypertrophy of the left ventricle. Normal LV systolic function with EF 58%. Normal global wall motion. Frequent PVC's seen.  Doppler evidence of grade I (impaired) diastolic dysfunction, normal LAP. Calculated EF 58%. Right atrial cavity is mildly dilated. Well seated and normal functioning bioprosthetic TAVR aortic valve. Mean PG 6 mmHg. No aortic valve regurgitation noted. Mild calcification of the mitral valve annulus. Mild to moderate mitral regurgitation. Moderate to severe tricuspid regurgitation. Estimated pulmonary artery systolic pressure is 40 mmHg.  No significant change compared to previous study on 06/28/2019.  TAVR 06/06/2019: 23 mm Edwards Sapien bioprosthetic aortic valve (Dr. Burt Knack)  LE [place] 06/06/2019: Right: Patent common femoral, superficial femoral, and popliteal arteries. Monophasic waveforms are detected distal to the tibioperoneal trunk.  Carotid artery duplex 12/28/2017: Minimal stenosis in the right internal carotid  artery (1-15%). Left vertebral artery waveform is biphasic and suggests proximal left subclavian artery stenosis.  Antegrade right vertebral artery flow. Further studies if clinically indicated.  Coronary intervention 09/21/2017: Successful diamondback orbital atherectomy and two-vessel PCI PTCA and overlapping  drug-eluting stents placement LCx Synergy DES 2.5 x 12 mm Synergy DES 2.5 x 20 mm Synergy DES 3.0 X 20 mm PTCA and overlapping drug-eluting stents placement LAD Synergy DES 3.0 x 24 mm Synergy DES 3.5 X 8 mm  Coronary angioram, LHC/RHC, RCA PCI 09/01/2017: Moderate calcific aortic and mitral valve stenosis Severe multivessel CAD Staged PCI to mid-prox RCA Successful PTCA and stent placement Resolute Onyx 3.5 X 26 mm DES  Moderate calcific aortic and mitral valve stenosis Severe multivessel CAD Staged PCI to mid-prox RCA Successful PTCA and stent placement Resolute Onyx 3.5 X 26 mm DES  Recent labs: 08/01/2019: Chol 211, TG 119, HDL 46, LDL 144  08/15/2018: Glucose 87. BUN/Cr 18/1.05. eGFR 48. Na/K 139/4.5  01/24/2018: Cholesterol 143, triglycerides 71, HDL 53, LDL 76. Glucose 84.  BUN/creatinine 22/0.9.  EGFR 67, sodium 137, potassium 4.3 H/H 13.2/39.9.  MCV 95.  Platelets 264   Review of Systems  Cardiovascular: Positive for leg swelling (Improved). Negative for chest pain, dyspnea on exertion, palpitations and syncope.  Neurological: Positive for light-headedness (1 episode.  No loss of consciousness.).       Vitals:   01/05/20 1102  BP: (!) 143/74  Pulse: 66  Temp: 97.8 F (36.6 C)  SpO2: 99%     Body mass index is 18.71 kg/m. Filed Weights   01/05/20 1102  Weight: 109 lb (49.4 kg)     Objective:   Physical Exam  Constitutional: No distress.  Neck: No JVD present.  Cardiovascular: Normal rate and regular rhythm. Frequent extrasystoles are present.  Murmur heard.  Harsh midsystolic murmur is present with a grade of 1/6 at the upper right sternal border. Pulses:      Femoral pulses are 3+ on the right side and 3+ on the left side.      Popliteal pulses are 2+ on the right side and 1+ on the left side.       Dorsalis pedis pulses are 0 on the right side and 0 on the left side.       Posterior tibial pulses are 0 on the left side.  Pulmonary/Chest: She  has no rales.  Musculoskeletal:        General: Edema (Trace right lower extremity edema.) present.  Nursing note and vitals reviewed.         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   Right leg edema: Negative for DVT.  Suspect venous insufficiency.  Recommend compression stockings and leg elevation.  Symptomatic PVC:  30% PVC burden on Holter monitor 10/2019. No PVC on exam and EKG today.   Continue amiodarone to 200 mg daily. Patient has upcoming labs with PCP next month.  S/p TAVR: Normal valve functioning. Endocarditis prophylaxis Repeat echo in 05/2020  Hyperlipidemia: Continue rosuvastatin 20 mg daily.   Coronary artery disease involving native coronary artery of native heart without angina pectoris Stable. Continue rosuvastatin. Not on Aspirin due to ongoing use of eliquis.  No bleeding issues noted.   PAF: CHA2DS2VASc score 4, annual stroke risk 5% Conitnue eliquis 2.5 mg bid   F/u in 3 months.  Nigel Mormon, MD [facility]  Pager: [phone] Office: [phone] If no answer Cell [phone]   

## 2020-02-07 DIAGNOSIS — E7800 Pure hypercholesterolemia, unspecified: Secondary | ICD-10-CM | POA: Diagnosis not present

## 2020-02-07 DIAGNOSIS — M810 Age-related osteoporosis without current pathological fracture: Secondary | ICD-10-CM | POA: Diagnosis not present

## 2020-02-07 DIAGNOSIS — I10 Essential (primary) hypertension: Secondary | ICD-10-CM | POA: Diagnosis not present

## 2020-02-07 DIAGNOSIS — N390 Urinary tract infection, site not specified: Secondary | ICD-10-CM | POA: Diagnosis not present

## 2020-02-07 DIAGNOSIS — R5383 Other fatigue: Secondary | ICD-10-CM | POA: Diagnosis not present

## 2020-02-12 DIAGNOSIS — I2510 Atherosclerotic heart disease of native coronary artery without angina pectoris: Secondary | ICD-10-CM | POA: Diagnosis not present

## 2020-02-12 DIAGNOSIS — Z952 Presence of prosthetic heart valve: Secondary | ICD-10-CM | POA: Diagnosis not present

## 2020-02-12 DIAGNOSIS — I272 Pulmonary hypertension, unspecified: Secondary | ICD-10-CM | POA: Diagnosis not present

## 2020-02-12 DIAGNOSIS — Z0001 Encounter for general adult medical examination with abnormal findings: Secondary | ICD-10-CM | POA: Diagnosis not present

## 2020-02-12 DIAGNOSIS — R8271 Bacteriuria: Secondary | ICD-10-CM | POA: Diagnosis not present

## 2020-02-12 DIAGNOSIS — N183 Chronic kidney disease, stage 3 unspecified: Secondary | ICD-10-CM | POA: Diagnosis not present

## 2020-02-12 DIAGNOSIS — I872 Venous insufficiency (chronic) (peripheral): Secondary | ICD-10-CM | POA: Diagnosis not present

## 2020-02-12 DIAGNOSIS — J309 Allergic rhinitis, unspecified: Secondary | ICD-10-CM | POA: Diagnosis not present

## 2020-02-12 DIAGNOSIS — R5383 Other fatigue: Secondary | ICD-10-CM | POA: Diagnosis not present

## 2020-02-12 DIAGNOSIS — E7800 Pure hypercholesterolemia, unspecified: Secondary | ICD-10-CM | POA: Diagnosis not present

## 2020-02-12 DIAGNOSIS — M810 Age-related osteoporosis without current pathological fracture: Secondary | ICD-10-CM | POA: Diagnosis not present

## 2020-02-12 DIAGNOSIS — W19XXXA Unspecified fall, initial encounter: Secondary | ICD-10-CM | POA: Diagnosis not present

## 2020-02-29 DIAGNOSIS — R630 Anorexia: Secondary | ICD-10-CM | POA: Diagnosis not present

## 2020-02-29 DIAGNOSIS — I10 Essential (primary) hypertension: Secondary | ICD-10-CM | POA: Diagnosis not present

## 2020-02-29 DIAGNOSIS — E559 Vitamin D deficiency, unspecified: Secondary | ICD-10-CM | POA: Diagnosis not present

## 2020-02-29 DIAGNOSIS — I951 Orthostatic hypotension: Secondary | ICD-10-CM | POA: Diagnosis not present

## 2020-03-21 DIAGNOSIS — E559 Vitamin D deficiency, unspecified: Secondary | ICD-10-CM | POA: Diagnosis not present

## 2020-03-21 DIAGNOSIS — Z9181 History of falling: Secondary | ICD-10-CM | POA: Diagnosis not present

## 2020-03-21 DIAGNOSIS — R630 Anorexia: Secondary | ICD-10-CM | POA: Diagnosis not present

## 2020-03-21 DIAGNOSIS — I951 Orthostatic hypotension: Secondary | ICD-10-CM | POA: Diagnosis not present

## 2020-03-21 DIAGNOSIS — I10 Essential (primary) hypertension: Secondary | ICD-10-CM | POA: Diagnosis not present

## 2020-03-27 ENCOUNTER — Other Ambulatory Visit: Payer: Self-pay | Admitting: Cardiology

## 2020-03-27 DIAGNOSIS — I493 Ventricular premature depolarization: Secondary | ICD-10-CM

## 2020-04-08 DIAGNOSIS — L600 Ingrowing nail: Secondary | ICD-10-CM | POA: Diagnosis not present

## 2020-04-08 DIAGNOSIS — M79671 Pain in right foot: Secondary | ICD-10-CM | POA: Diagnosis not present

## 2020-04-08 DIAGNOSIS — M79674 Pain in right toe(s): Secondary | ICD-10-CM | POA: Diagnosis not present

## 2020-04-17 NOTE — Progress Notes (Deleted)
Follow up visit  Subjective:   Catherine Anthony, female    DOB: 01-Jul-1930, 86 y.o.   MRN: [id number]   No chief complaint on file.    HPI  86 year old Caucasian female with hypertension, CAD s/p multivessel PCI (09/2017), s/p 23 mm Edwards Sapien TAVR (05/2019), paroxysmal Afib, frequent PVC's, here on 3 month follow up.  ***At last visit in 02/21, I suspected venous insufficicy as the cause of her leg edema. I recommended leg elevation and compression stockings.   I reviewed her most recent lab results. ***   ***Previous OV on 01/05/2020: PVCs reduced in frequency on amiodarone.  At last visit, she was noticed to have right leg swelling.  Ultrasound did not show any DVT.  Swelling is now improved.  Patient had one episode of lightheadedness after having sat in direct sunlight for long time.  Daughter mentions that she does not eat anything in the morning, and is reduced her overall intake. ***  Current Outpatient Medications on File Prior to Visit  Medication Sig Dispense Refill  . amiodarone (PACERONE) 200 MG tablet Take 1 tablet (200 mg total) by mouth daily. 90 tablet 3  . amLODipine (NORVASC) 10 MG tablet Take 1 tablet (10 mg total) by mouth daily. 90 tablet 3  . ELIQUIS 2.5 MG TABS tablet TAKE (1) TABLET BY MOUTH TWICE DAILY. 60 tablet 0  . furosemide (LASIX) 20 MG tablet Take 1 tablet (20 mg total) by mouth daily. 7 tablet 1  . lisinopril (PRINIVIL,ZESTRIL) 40 MG tablet Take 40 mg by mouth daily.     . metoprolol tartrate (LOPRESSOR) 25 MG tablet TAKE (1) TABLET BY MOUTH TWICE DAILY. 120 tablet 0  . nitroGLYCERIN (NITROSTAT) 0.4 MG SL tablet Place 0.4 mg under the tongue every 5 (five) minutes x 3 doses as needed for chest pain.    . rosuvastatin (CRESTOR) 20 MG tablet Take 1 tablet (20 mg total) by mouth daily. 60 tablet 3   No current facility-administered medications on file prior to visit.    Cardiovascular studies: ***  EKG ***/***/202***: ***  Lower  Extremity Venous Duplex Right 12/14/2019:  No evidence of deep vein thrombosis of the right lower extremity with  normal venous return.  EKG 12/11/2019: Sinus rhythm 59 bpm. First degree AV block.  24-hour Holter monitor 10/03/2019: Sinus rhythm.  Heart rate 67-129 bpm.  Average heart rate 88 bpm. 30 % ventricular ectopy in the form of PVCs, bigeminy, trigeminy, and nonsustained ventricular tachycardia up to 7 beats.  Ventricular ectopy is predominantly unifocal. 8% supraventricular ectopy in the form of atrial couplets, bigeminy, trigeminy. No atrial flutter, atrial fibrillation, ventricular tachycardia. No sinus pauses > 3 seconds, no high-grade AV block.  Echocardiogram 10/02/2019: Left ventricle cavity is normal in size. Mild concentric hypertrophy of the left ventricle. Normal LV systolic function with EF 58%. Normal global wall motion. Frequent PVC's seen.  Doppler evidence of grade I (impaired) diastolic dysfunction, normal LAP. Calculated EF 58%. Right atrial cavity is mildly dilated. Well seated and normal functioning bioprosthetic TAVR aortic valve. Mean PG 6 mmHg. No aortic valve regurgitation noted. Mild calcification of the mitral valve annulus. Mild to moderate mitral regurgitation. Moderate to severe tricuspid regurgitation. Estimated pulmonary artery systolic pressure is 40 mmHg.  No significant change compared to previous study on 06/28/2019.  TAVR 06/06/2019: 23 mm Edwards Sapien bioprosthetic aortic valve (Dr. Burt Knack)  LE [place] 06/06/2019: Right: Patent common femoral, superficial femoral, and popliteal arteries. Monophasic waveforms are detected distal to the  tibioperoneal trunk.  Carotid artery duplex 12/28/2017: Minimal stenosis in the right internal carotid artery (1-15%). Left vertebral artery waveform is biphasic and suggests proximal left subclavian artery stenosis.  Antegrade right vertebral artery flow. Further studies if clinically indicated.  Coronary  intervention 09/21/2017: Successful diamondback orbital atherectomy and two-vessel PCI PTCA and overlapping drug-eluting stents placement LCx Synergy DES 2.5 x 12 mm Synergy DES 2.5 x 20 mm Synergy DES 3.0 X 20 mm PTCA and overlapping drug-eluting stents placement LAD Synergy DES 3.0 x 24 mm Synergy DES 3.5 X 8 mm  Coronary angioram, LHC/RHC, RCA PCI 09/01/2017: Moderate calcific aortic and mitral valve stenosis Severe multivessel CAD Staged PCI to mid-prox RCA Successful PTCA and stent placement Resolute Onyx 3.5 X 26 mm DES  Moderate calcific aortic and mitral valve stenosis Severe multivessel CAD Staged PCI to mid-prox RCA Successful PTCA and stent placement Resolute Onyx 3.5 X 26 mm DES  *** Recent labs: 02/12/2020: ***cant find rest of labs Glucose ***, BUN/Cr ***/***. EGFR 34. Na/K ***/***. ***Rest of the CMP normal H/H ***/***. MCV ***. Platelets *** Chol ***, TG 75, HDL 70, LDL *** TSH 1.78 normal  08/01/2019: Chol 211, TG 119, HDL 46, LDL 144  06/01/2019: HbA1C 5.0%  08/15/2018: Glucose 87. BUN/Cr 18/1.05. eGFR 48. Na/K 139/4.5  01/24/2018: Cholesterol 143, triglycerides 71, HDL 53, LDL 76. Glucose 84.  BUN/creatinine 22/0.9.  EGFR 67, sodium 137, potassium 4.3 H/H 13.2/39.9.  MCV 95.  Platelets 264   Review of Systems  Cardiovascular: Positive for leg swelling (Improved). Negative for chest pain, dyspnea on exertion, palpitations and syncope.  Neurological: Positive for light-headedness (1 episode.  No loss of consciousness.).       There were no vitals filed for this visit.   There is no height or weight on file to calculate BMI. There were no vitals filed for this visit.   Objective:   Physical Exam  Constitutional: No distress.  Neck: No JVD present.  Cardiovascular: Normal rate and regular rhythm. Frequent extrasystoles are present.  Murmur heard.  Harsh midsystolic murmur is present with a grade of 1/6 at the upper right sternal  border. Pulses:      Femoral pulses are 3+ on the right side and 3+ on the left side.      Popliteal pulses are 2+ on the right side and 1+ on the left side.       Dorsalis pedis pulses are 0 on the right side and 0 on the left side.       Posterior tibial pulses are 0 on the left side.  Pulmonary/Chest: She has no rales.  Musculoskeletal:        General: Edema (Trace right lower extremity edema.) present.  Nursing note and vitals reviewed.         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   ***   ***Prev OV: Right leg edema: Negative for DVT.  Suspect venous insufficiency.  Recommend compression stockings and leg elevation.  Symptomatic PVC:  30% PVC burden on Holter monitor 10/2019. No PVC on exam and EKG today.   Continue amiodarone to 200 mg daily. Patient has upcoming labs with PCP next month.  S/p TAVR: Normal valve functioning. Endocarditis prophylaxis Repeat echo in 05/2020  Hyperlipidemia: Continue rosuvastatin 20 mg daily.   Coronary artery disease involving native coronary artery of native heart without angina pectoris Stable.  Continue rosuvastatin. Not on Aspirin due to ongoing use of eliquis.  No bleeding issues noted.   PAF: CHA2DS2VASc score 4, annual stroke risk 5% Conitnue eliquis 2.5 mg bid   F/u in 3 months.  Nigel Mormon, MD [facility] [place] Pager: [phone] Office: [phone] If no answer Cell [phone]

## 2020-04-18 ENCOUNTER — Ambulatory Visit: Payer: Medicare Other | Admitting: Cardiology

## 2020-04-19 ENCOUNTER — Other Ambulatory Visit: Payer: Self-pay | Admitting: Cardiology

## 2020-04-19 DIAGNOSIS — I2510 Atherosclerotic heart disease of native coronary artery without angina pectoris: Secondary | ICD-10-CM

## 2020-05-06 DIAGNOSIS — M810 Age-related osteoporosis without current pathological fracture: Secondary | ICD-10-CM | POA: Diagnosis not present

## 2020-05-15 ENCOUNTER — Other Ambulatory Visit: Payer: Self-pay

## 2020-05-15 ENCOUNTER — Ambulatory Visit: Payer: Medicare Other | Admitting: Cardiology

## 2020-05-15 ENCOUNTER — Encounter: Payer: Self-pay | Admitting: Cardiology

## 2020-05-15 VITALS — BP 170/70 | HR 49 | Resp 17 | Ht 64.0 in | Wt 102.0 lb

## 2020-05-15 DIAGNOSIS — I10 Essential (primary) hypertension: Secondary | ICD-10-CM

## 2020-05-15 DIAGNOSIS — I2510 Atherosclerotic heart disease of native coronary artery without angina pectoris: Secondary | ICD-10-CM | POA: Diagnosis not present

## 2020-05-15 DIAGNOSIS — I493 Ventricular premature depolarization: Secondary | ICD-10-CM

## 2020-05-15 DIAGNOSIS — I480 Paroxysmal atrial fibrillation: Secondary | ICD-10-CM | POA: Diagnosis not present

## 2020-05-15 MED ORDER — HYDRALAZINE HCL 25 MG PO TABS: 25.0000 mg | ORAL_TABLET | Freq: Three times a day (TID) | ORAL | 3 refills | Status: DC

## 2020-05-15 NOTE — Progress Notes (Addendum)
Follow up visit  Subjective:   Catherine Anthony, female    DOB: [birth year], 86 y.o.   MRN: [id number]   Chief Complaint  Patient presents with  . PVCs  . Hypertension  . 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   PVCs reduced in frequency on amiodarone.  At last visit, she was noticed to have right leg swelling.  Ultrasound did not show any DVT.  Swelling is now improved.  She was weaned off amlodipine in spring 2021 by her PCP, due to complaints of lightheadedness. Lightheadedness has improved since then, but blood pressure is now uncontrolled Reviewed 01/2020 labs.   Current Outpatient Medications on File Prior to Visit  Medication Sig Dispense Refill  . amiodarone (PACERONE) 200 MG tablet Take 200 mg by mouth as directed. Monday and Friday    . ELIQUIS 2.5 MG TABS tablet TAKE (1) TABLET BY MOUTH TWICE DAILY. 60 tablet 0  . lisinopril (PRINIVIL,ZESTRIL) 40 MG tablet Take 40 mg by mouth daily.     . metoprolol tartrate (LOPRESSOR) 25 MG tablet TAKE (1) TABLET BY MOUTH TWICE DAILY. 120 tablet 0  . rosuvastatin (CRESTOR) 20 MG tablet Take 20 mg by mouth daily.    Marland Kitchen VITAMIN D, CHOLECALCIFEROL, PO Take 1 tablet by mouth daily.    Marland Kitchen amLODipine (NORVASC) 10 MG tablet Take 1 tablet (10 mg total) by mouth daily. (Patient not taking: Reported on 05/15/2020) 90 tablet 3  . nitroGLYCERIN (NITROSTAT) 0.4 MG SL tablet Place 0.4 mg under the tongue every 5 (five) minutes x 3 doses as needed for chest pain. (Patient not taking: Reported on 05/15/2020)    . Potassium (POTASSIMIN PO) Take 1 tablet by mouth daily. (Patient not taking: Reported on 05/15/2020)     No current facility-administered medications on file prior to visit.    Cardiovascular studies:  EKG 05/15/2020: Sinus bradycardia 49 bpm First degree A-V block   Lower Extremity Venous  Duplex Right 12/14/2019:  No evidence of deep vein thrombosis of the right lower extremity with  normal venous return.  EKG 12/11/2019: Sinus rhythm 59 bpm. First degree AV block.  24-hour Holter monitor 10/03/2019: Sinus rhythm.  Heart rate 67-129 bpm.  Average heart rate 88 bpm. 30 % ventricular ectopy in the form of PVCs, bigeminy, trigeminy, and nonsustained ventricular tachycardia up to 7 beats.  Ventricular ectopy is predominantly unifocal. 8% supraventricular ectopy in the form of atrial couplets, bigeminy, trigeminy. No atrial flutter, atrial fibrillation, ventricular tachycardia. No sinus pauses > 3 seconds, no high-grade AV block.  Echocardiogram 10/02/2019: Left ventricle cavity is normal in size. Mild concentric hypertrophy of the left ventricle. Normal LV systolic function with EF 58%. Normal global wall motion. Frequent PVC's seen.  Doppler evidence of grade I (impaired) diastolic dysfunction, normal LAP. Calculated EF 58%. Right atrial cavity is mildly dilated. Well seated and normal functioning bioprosthetic TAVR aortic valve. Mean PG 6 mmHg. No aortic valve regurgitation noted. Mild calcification of the mitral valve annulus. Mild to moderate mitral regurgitation. Moderate to severe tricuspid regurgitation. Estimated pulmonary artery systolic pressure is 40 mmHg.  No significant change compared to previous study on 06/28/2019.  TAVR 06/06/2019: 23 mm Edwards Sapien bioprosthetic aortic valve (Dr. Burt Knack)  LE [place] 06/06/2019: Right: Patent common femoral, superficial femoral, and popliteal arteries. Monophasic waveforms are detected distal to the tibioperoneal trunk.  Carotid artery duplex 12/28/2017: Minimal stenosis in the right internal carotid artery (1-15%). Left vertebral artery waveform is biphasic and suggests proximal left subclavian artery stenosis.  Antegrade right vertebral artery flow. Further studies if clinically indicated.  Coronary intervention  09/21/2017: Successful diamondback orbital atherectomy and two-vessel PCI PTCA and overlapping drug-eluting stents placement LCx Synergy DES 2.5 x 12 mm Synergy DES 2.5 x 20 mm Synergy DES 3.0 X 20 mm PTCA and overlapping drug-eluting stents placement LAD Synergy DES 3.0 x 24 mm Synergy DES 3.5 X 8 mm  Coronary angioram, LHC/RHC, RCA PCI 09/01/2017: Moderate calcific aortic and mitral valve stenosis Severe multivessel CAD Staged PCI to mid-prox RCA Successful PTCA and stent placement Resolute Onyx 3.5 X 26 mm DES  Moderate calcific aortic and mitral valve stenosis Severe multivessel CAD Staged PCI to mid-prox RCA Successful PTCA and stent placement Resolute Onyx 3.5 X 26 mm DES  Recent labs: 01/2020: Glucose 104. BUN/Cr 21/1.5. eGFR 34.  HbA1C 5.0% Chol 211, TG 75, HDL 46, LDL 67  08/01/2019: Chol 211, TG 119, HDL 46, LDL 144  08/15/2018: Glucose 87. BUN/Cr 18/1.05. eGFR 48. Na/K 139/4.5  01/24/2018: Cholesterol 143, triglycerides 71, HDL 53, LDL 76. Glucose 84.  BUN/creatinine 22/0.9.  EGFR 67, sodium 137, potassium 4.3 H/H 13.2/39.9.  MCV 95.  Platelets 264   Review of Systems  Constitutional: Positive for decreased appetite.  Cardiovascular: Positive for dyspnea on exertion. Negative for chest pain, leg swelling, palpitations and syncope.  Respiratory: Positive for shortness of breath.        Vitals:   05/15/20 1038 05/15/20 1046  BP: (!) 190/78 (!) 170/70  Pulse: (!) 51 (!) 49  Resp: 17   SpO2: 99% 99%     Body mass index is 17.51 kg/m. Filed Weights   05/15/20 1038  Weight: 102 lb (46.3 kg)     Objective:   Physical Exam Vitals and nursing note reviewed.  Constitutional:      General: She is not in acute distress. Neck:     Vascular: No JVD.  Cardiovascular:     Rate and Rhythm: Regular rhythm. Bradycardia present.     Pulses:          Femoral pulses are 3+ on the right side and 3+ on the left side.      Popliteal pulses are 2+ on the right  side and 1+ on the left side.       Dorsalis pedis pulses are 0 on the right side and 0 on the left side.       Posterior tibial pulses are 0 on the left side.     Heart sounds: No murmur heard.   Pulmonary:     Breath sounds: No rales.           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   Hypertension: Uncontrolled. Likely contributing to shortness of breath. Unable to use BB due to bradycardia, amlodipine caused lightheadedness, unable to use spironolactone due to low GFR.  Added hydralazine 25 mg tid  Symptomatic PVC:  30% PVC burden on Holter monitor 10/2019. No PVC on exam and EKG today, but patient is bradycardic. Reduce amiodarone to 200 mg twice/week.  S/p TAVR: Normal valve functioning. Endocarditis prophylaxis Not on aspirin, as she is on eliquis   Coronary artery disease involving native coronary artery of native heart without angina pectoris Stable. Continue  rosuvastatin. Not on Aspirin due to ongoing use of eliquis.  No bleeding issues noted.   PAF: CHA2DS2VASc score 4, annual stroke risk 5% Conitnue eliquis 2.5 mg bid   F/u in 1 month  Time spent: 40 min  Laprecious Austill Esther Hardy, MD [facility] [place] Pager: [phone] Office: [phone] If no answer Cell [phone]   ___________________  ADDENDUM: She has has NYHA class II symptoms. KCCQ copied below. 2D echo report still pending formal read but per Dr. Earnie Larsson, TAVR functioning normally.   [place] Cardiomyopathy Questionnaire  KCCQ-12 06/07/2020 06/29/2019  1 a. Ability to shower/bathe Not at all limited Not at all limited  1 b. Ability to walk 1 block Moderately limited Other, Did not do  1 c. Ability to hurry/jog Other, Did not do Other, Did not do  2. Edema feet/ankles/legs Never over the past 2 weeks Never over the past 2 weeks   3. Limited by fatigue 3+ times per week, not every day Never over the past 2 weeks  4. Limited by dyspnea 3+ times a week, not every day Never over the past 2 weeks  5. Sitting up / on 3+ pillows Never over the past 2 weeks Never over the past 2 weeks  6. Limited enjoyment of life Moderately limited Not limited at all  7. Rest of life w/ symptoms Mostly satisfied Completely satisfied  8 a. Participation in hobbies Slightly limited N/A, did not do for other reasons  8 b. Participation in chores Slightly limited Did not limit at all  8 c. Visiting family/friends N/A, did not do for other reasons N/A, did not do for other reasons    Angelena Form PA-C  [facility]

## 2020-05-22 ENCOUNTER — Other Ambulatory Visit: Payer: Self-pay | Admitting: Cardiology

## 2020-05-22 DIAGNOSIS — I493 Ventricular premature depolarization: Secondary | ICD-10-CM

## 2020-05-25 ENCOUNTER — Other Ambulatory Visit: Payer: Self-pay | Admitting: Cardiology

## 2020-05-29 DIAGNOSIS — I10 Essential (primary) hypertension: Secondary | ICD-10-CM | POA: Diagnosis not present

## 2020-06-04 ENCOUNTER — Other Ambulatory Visit: Payer: Self-pay | Admitting: Pharmacist

## 2020-06-04 ENCOUNTER — Other Ambulatory Visit: Payer: Self-pay

## 2020-06-04 ENCOUNTER — Ambulatory Visit: Payer: Medicare Other

## 2020-06-05 ENCOUNTER — Other Ambulatory Visit (HOSPITAL_COMMUNITY): Payer: Medicare Other

## 2020-06-05 ENCOUNTER — Ambulatory Visit: Payer: Medicare Other | Admitting: Physician Assistant

## 2020-06-05 ENCOUNTER — Ambulatory Visit: Payer: Medicare Other

## 2020-06-10 ENCOUNTER — Encounter: Payer: Self-pay | Admitting: Cardiology

## 2020-06-10 ENCOUNTER — Ambulatory Visit: Payer: Medicare Other | Admitting: Cardiology

## 2020-06-10 ENCOUNTER — Other Ambulatory Visit: Payer: Self-pay

## 2020-06-10 VITALS — BP 147/68 | HR 58 | Resp 15 | Ht 64.0 in | Wt 110.0 lb

## 2020-06-10 DIAGNOSIS — I480 Paroxysmal atrial fibrillation: Secondary | ICD-10-CM

## 2020-06-10 DIAGNOSIS — I2510 Atherosclerotic heart disease of native coronary artery without angina pectoris: Secondary | ICD-10-CM

## 2020-06-10 DIAGNOSIS — I493 Ventricular premature depolarization: Secondary | ICD-10-CM

## 2020-06-10 DIAGNOSIS — Z952 Presence of prosthetic heart valve: Secondary | ICD-10-CM

## 2020-06-10 DIAGNOSIS — I10 Essential (primary) hypertension: Secondary | ICD-10-CM

## 2020-06-10 DIAGNOSIS — I5032 Chronic diastolic (congestive) heart failure: Secondary | ICD-10-CM

## 2020-06-10 MED ORDER — FUROSEMIDE 20 MG PO TABS: 20.0000 mg | ORAL_TABLET | Freq: Every day | ORAL | 3 refills | Status: DC

## 2020-06-10 NOTE — Progress Notes (Signed)
Follow up visit  Subjective:   Catherine Anthony, female    DOB: 1929/12/22, 86 y.o.   MRN: [id number]   Chief Complaint  Patient presents with  . Bradycardia  . Hypertension  . PVCs  . 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   PVCs reduced in frequency on amiodarone. She was weaned off amlodipine in spring 2021 by her PCP, due to complaints of lightheadedness. At visit in 04/2020, blood pressure was uncontrolled. Unable to use BB due to bradycardia, amlodipine caused lightheadedness, unable to use spironolactone due to low GFR. Thus, I added hydralazine 25 mg tid.  Home blood pressure log shows much better controlled blood pressures, although elevated today.  Patient continues to have exertional dyspnea with usual physical activity.  She has noticed mild leg edema.  Patient and daughter have seen hesitancy regarding Covid vaccine.  I offered to answer the questions.   [place] Cardiomyopathy Questionnaire  KCCQ-12 06/07/2020 06/29/2019  1 a. Ability to shower/bathe Not at all limited Not at all limited  1 b. Ability to walk 1 block Moderately limited Other, Did not do  1 c. Ability to hurry/jog Other, Did not do Other, Did not do  2. Edema feet/ankles/legs Never over the past 2 weeks Never over the past 2 weeks  3. Limited by fatigue 3+ times per week, not every day Never over the past 2 weeks  4. Limited by dyspnea 3+ times a week, not every day Never over the past 2 weeks  5. Sitting up / on 3+ pillows Never over the past 2 weeks Never over the past 2 weeks  6. Limited enjoyment of life Moderately limited Not limited at all  7. Rest of life w/ symptoms Mostly satisfied Completely satisfied  8 a. Participation in hobbies Slightly limited N/A, did not do for other reasons  8 b. Participation in chores Slightly limited Did not limit  at all  8 c. Visiting family/friends N/A, did not do for other reasons N/A, did not do for other reasons    Current Outpatient Medications on File Prior to Visit  Medication Sig Dispense Refill  . amiodarone (PACERONE) 200 MG tablet Take 200 mg by mouth as directed. Monday and Friday    . amLODipine (NORVASC) 10 MG tablet Take 1 tablet (10 mg total) by mouth daily. (Patient not taking: Reported on 05/15/2020) 90 tablet 3  . ELIQUIS 2.5 MG TABS tablet TAKE (1) TABLET BY MOUTH TWICE DAILY. 60 tablet 0  . hydrALAZINE (APRESOLINE) 25 MG tablet Take 1 tablet (25 mg total) by mouth 3 (three) times daily. 90 tablet 3  . lisinopril (PRINIVIL,ZESTRIL) 40 MG tablet Take 40 mg by mouth daily.     . metoprolol tartrate (LOPRESSOR) 25 MG tablet TAKE (1) TABLET BY MOUTH TWICE DAILY. 120 tablet 0  . mirtazapine (REMERON) 15 MG tablet Take 7.5-15 mg by mouth at bedtime.    . nitroGLYCERIN (NITROSTAT) 0.4 MG SL tablet Place 0.4 mg under the tongue every 5 (five) minutes x 3 doses as needed for chest pain. (Patient not taking: Reported on 05/15/2020)    . Potassium (POTASSIMIN PO) Take 1 tablet by mouth daily. (Patient not taking: Reported on 05/15/2020)    . rosuvastatin (CRESTOR) 20 MG tablet Take 20 mg by mouth daily.    Marland Kitchen VITAMIN D, CHOLECALCIFEROL, PO Take  1 tablet by mouth daily.     No current facility-administered medications on file prior to visit.    Cardiovascular studies:  EKG 06/10/2020: Sinus rhythm 60 bpm  Low voltage in limb leads  Nonspecific T-abnormality  Echocardiogram 06/05/2020:  Normal LV systolic function with visual EF 65-70%. Left ventricle cavity  is normal in size. Normal global wall motion. Doppler evidence of grade II  diastolic dysfunction, elevated LAP. Calculated EF 58%.  Left atrial cavity is severely dilated.  Right atrial cavity visually appears moderately dilated.  Aortic bioprosthesis (23 mm Edwards Sapien TAVR) well seated, without  evidence of dehiscence, or  perivalvular regurgitation. No valvular  regurgitation.  Posterior mitral valve leaflet is calcified with normal leaflet excursion.  No evidence of mitral stenosis. Moderate (Grade III) mitral regurgitation.  Moderate to severe tricuspid regurgitation. Mild pulmonary hypertension.  RVSP measures 35 mmHg.  Mild pulmonic regurgitation.  IVC is dilated with a respiratory response of >50%.  Compared to prior study dated 10/02/2019: Grade II DD is new otherwise no  significant change.   Lower Extremity Venous Duplex Right 12/14/2019:  No evidence of deep vein thrombosis of the right lower extremity with normal venous return.  R/LHC 05/16/2019: LM: Normal LAD: Patent prox-mid LAD overlapping stents. Synergy DES 3.0 x 24 mm, Synergy DES 3.5 X 8 mm. Placed 08/2017          No ISR. No other significant disease. LCx: Patent prox-mid LCx overlapping stents. Synergy DES 2.5 x 12 mm, Synergy DES 2.5 x 20 mm, Synergy DES 3.0 X 20 mm. Placed 08/2017          No ISR. No other significant disease. RCA: Patent prox RCA stent. Successful PTCA and stent placement Resolute Onyx 3.5 X 26 mm DES.  Placed 08/2017           Minimal 10% late lumen loss. No significant ISR. No other significant disease No obstructive CAD  Severe aortic valve stenosis (Simultaneous LV-Ao hemodynamic assessment) Mean PG 39 mmHg, AVA 0.6 cm2, AVAi 0.4 cm2/m2  Recommendation: TAVR workup  24-hour Holter monitor 10/03/2019: Sinus rhythm.  Heart rate 67-129 bpm.  Average heart rate 88 bpm. 30 % ventricular ectopy in the form of PVCs, bigeminy, trigeminy, and nonsustained ventricular tachycardia up to 7 beats.  Ventricular ectopy is predominantly unifocal. 8% supraventricular ectopy in the form of atrial couplets, bigeminy, trigeminy. No atrial flutter, atrial fibrillation, ventricular tachycardia. No sinus pauses > 3 seconds, no high-grade AV block.  TAVR 06/06/2019: 23 mm Edwards Sapien bioprosthetic aortic valve (Dr.  Burt Knack)  LE [place] 06/06/2019: Right: Patent common femoral, superficial femoral, and popliteal arteries. Monophasic waveforms are detected distal to the tibioperoneal trunk.  Carotid artery duplex 12/28/2017: Minimal stenosis in the right internal carotid artery (1-15%). Left vertebral artery waveform is biphasic and suggests proximal left subclavian artery stenosis.  Antegrade right vertebral artery flow. Further studies if clinically indicated.  Recent labs: 01/2020: Glucose 104. BUN/Cr 21/1.5. eGFR 34.  HbA1C 5.0% Chol 211, TG 75, HDL 46, LDL 67  08/01/2019: Chol 211, TG 119, HDL 46, LDL 144  08/15/2018: Glucose 87. BUN/Cr 18/1.05. eGFR 48. Na/K 139/4.5  01/24/2018: Cholesterol 143, triglycerides 71, HDL 53, LDL 76. Glucose 84.  BUN/creatinine 22/0.9.  EGFR 67, sodium 137, potassium 4.3 H/H 13.2/39.9.  MCV 95.  Platelets 264   Review of Systems  Constitutional: Positive for decreased appetite.  Cardiovascular: Positive for dyspnea on exertion. Negative for chest pain, leg swelling, palpitations and syncope.  Respiratory: Positive for  shortness of breath.        Vitals:   06/10/20 1123  BP: (!) 147/68  Pulse: (!) 58  Resp: 15  SpO2: 97%     Body mass index is 18.88 kg/m. Filed Weights   06/10/20 1123  Weight: 110 lb (49.9 kg)     Objective:   Physical Exam Vitals and nursing note reviewed.  Constitutional:      General: She is not in acute distress. Neck:     Vascular: No JVD.  Cardiovascular:     Rate and Rhythm: Regular rhythm. Bradycardia present.     Pulses:          Femoral pulses are 3+ on the right side and 3+ on the left side.      Popliteal pulses are 2+ on the right side and 1+ on the left side.       Dorsalis pedis pulses are 0 on the right side and 0 on the left side.       Posterior tibial pulses are 0 on the left side.     Heart sounds: No murmur heard.   Pulmonary:     Breath sounds: No rales.           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   HFpEF: Likely cause of her exertional dyspnea.  Grade 2 diastolic dysfunction on echocardiogram, although could be affected by mitral annular calcification. Added Lasix 20 mg daily.  Encourage hydration through 7 months, while on Lasix.  Hypertension:  Better controlled.    Symptomatic PVC:  30% PVC burden on Holter monitor 10/2019. No PVC on exam and EKG today, but patient is bradycardic. Continue amiodarone to 200 mg twice/week.  With reduction in dose of amiodarone, heart rate has improved.  S/p TAVR: NYHA class II symptoms Normal valve functioning. Endocarditis prophylaxis Not on aspirin, as she is on eliquis   Coronary artery disease involving native coronary artery of native heart without angina pectoris Stable. Continue rosuvastatin. Not on Aspirin due to ongoing use of eliquis.  No bleeding issues noted.   PAF: CHA2DS2VASc score 4, annual stroke risk 5% Conitnue eliquis 2.5 mg bid   Conversation with patient and daughter regarding importance of Covid vaccination especially in light of increasing prevalence of the delta variant in the [place] states.  I explained to them that some side effects are to be expected, although severe adverse events are negligible.  Patient and daughter will further think about the decision.  F/u in 1 month  [place], MD [facility] Pager: [phone] Office: [phone] If no answer Cell [phone]   ___________________  ADDENDUM: She has has NYHA class II symptoms. KCCQ copied below. 2D echo report still pending formal read but per Dr. Virgina Jock, TAVR functioning normally.   [place] Cardiomyopathy Questionnaire  KCCQ-12 06/07/2020 06/29/2019  1 a. Ability to shower/bathe Not at all limited Not at all limited  1 b. Ability to  walk 1 block Moderately limited Other, Did not do  1 c. Ability to hurry/jog Other, Did not do Other, Did not do  2. Edema feet/ankles/legs Never over the past 2 weeks Never over the past 2 weeks  3. Limited by fatigue 3+ times per week, not every day Never over the past 2 weeks  4. Limited by dyspnea 3+ times a week, not every day Never over the past  2 weeks  5. Sitting up / on 3+ pillows Never over the past 2 weeks Never over the past 2 weeks  6. Limited enjoyment of life Moderately limited Not limited at all  7. Rest of life w/ symptoms Mostly satisfied Completely satisfied  8 a. Participation in hobbies Slightly limited N/A, did not do for other reasons  8 b. Participation in chores Slightly limited Did not limit at all  8 c. Visiting family/friends N/A, did not do for other reasons N/A, did not do for other reasons    Angelena Form PA-C  [facility]

## 2020-06-18 ENCOUNTER — Other Ambulatory Visit: Payer: Self-pay | Admitting: Cardiology

## 2020-06-22 ENCOUNTER — Other Ambulatory Visit: Payer: Self-pay | Admitting: Cardiology

## 2020-06-29 DIAGNOSIS — I10 Essential (primary) hypertension: Secondary | ICD-10-CM | POA: Diagnosis not present

## 2020-07-17 ENCOUNTER — Ambulatory Visit: Payer: Medicare Other | Admitting: Cardiology

## 2020-07-20 ENCOUNTER — Other Ambulatory Visit: Payer: Self-pay | Admitting: Cardiology

## 2020-07-20 DIAGNOSIS — I493 Ventricular premature depolarization: Secondary | ICD-10-CM

## 2020-07-24 ENCOUNTER — Inpatient Hospital Stay (HOSPITAL_COMMUNITY)
Admission: EM | Admit: 2020-07-24 | Discharge: 2020-07-26 | DRG: 065 | Disposition: A | Discharge status: Skilled Nursing Facility | Payer: Medicare Other | Attending: Internal Medicine | Admitting: Internal Medicine

## 2020-07-24 ENCOUNTER — Emergency Department (HOSPITAL_COMMUNITY): Payer: Medicare Other

## 2020-07-24 ENCOUNTER — Other Ambulatory Visit (HOSPITAL_COMMUNITY): Payer: Medicare Other

## 2020-07-24 ENCOUNTER — Observation Stay (HOSPITAL_COMMUNITY): Payer: Medicare Other

## 2020-07-24 ENCOUNTER — Encounter (HOSPITAL_COMMUNITY): Payer: Self-pay

## 2020-07-24 ENCOUNTER — Telehealth: Payer: Self-pay | Admitting: Cardiology

## 2020-07-24 DIAGNOSIS — Z7901 Long term (current) use of anticoagulants: Secondary | ICD-10-CM

## 2020-07-24 DIAGNOSIS — G8194 Hemiplegia, unspecified affecting left nondominant side: Secondary | ICD-10-CM

## 2020-07-24 DIAGNOSIS — E7800 Pure hypercholesterolemia, unspecified: Secondary | ICD-10-CM | POA: Diagnosis present

## 2020-07-24 DIAGNOSIS — N1832 Chronic kidney disease, stage 3b: Secondary | ICD-10-CM | POA: Diagnosis present

## 2020-07-24 DIAGNOSIS — E876 Hypokalemia: Secondary | ICD-10-CM | POA: Diagnosis not present

## 2020-07-24 DIAGNOSIS — Z20822 Contact with and (suspected) exposure to covid-19: Secondary | ICD-10-CM | POA: Diagnosis present

## 2020-07-24 DIAGNOSIS — I480 Paroxysmal atrial fibrillation: Secondary | ICD-10-CM | POA: Diagnosis present

## 2020-07-24 DIAGNOSIS — R29707 NIHSS score 7: Secondary | ICD-10-CM | POA: Diagnosis present

## 2020-07-24 DIAGNOSIS — Z961 Presence of intraocular lens: Secondary | ICD-10-CM | POA: Diagnosis present

## 2020-07-24 DIAGNOSIS — I10 Essential (primary) hypertension: Secondary | ICD-10-CM | POA: Diagnosis present

## 2020-07-24 DIAGNOSIS — I130 Hypertensive heart and chronic kidney disease with heart failure and stage 1 through stage 4 chronic kidney disease, or unspecified chronic kidney disease: Secondary | ICD-10-CM | POA: Diagnosis present

## 2020-07-24 DIAGNOSIS — Z952 Presence of prosthetic heart valve: Secondary | ICD-10-CM | POA: Diagnosis not present

## 2020-07-24 DIAGNOSIS — I6340 Cerebral infarction due to embolism of unspecified cerebral artery: Principal | ICD-10-CM | POA: Diagnosis present

## 2020-07-24 DIAGNOSIS — I2510 Atherosclerotic heart disease of native coronary artery without angina pectoris: Secondary | ICD-10-CM | POA: Diagnosis present

## 2020-07-24 DIAGNOSIS — I639 Cerebral infarction, unspecified: Secondary | ICD-10-CM | POA: Diagnosis not present

## 2020-07-24 DIAGNOSIS — E782 Mixed hyperlipidemia: Secondary | ICD-10-CM | POA: Diagnosis not present

## 2020-07-24 DIAGNOSIS — I69354 Hemiplegia and hemiparesis following cerebral infarction affecting left non-dominant side: Secondary | ICD-10-CM

## 2020-07-24 DIAGNOSIS — R0689 Other abnormalities of breathing: Secondary | ICD-10-CM | POA: Diagnosis not present

## 2020-07-24 DIAGNOSIS — R531 Weakness: Secondary | ICD-10-CM | POA: Diagnosis not present

## 2020-07-24 DIAGNOSIS — I5030 Unspecified diastolic (congestive) heart failure: Secondary | ICD-10-CM | POA: Diagnosis present

## 2020-07-24 DIAGNOSIS — Z955 Presence of coronary angioplasty implant and graft: Secondary | ICD-10-CM

## 2020-07-24 DIAGNOSIS — R29810 Facial weakness: Secondary | ICD-10-CM | POA: Diagnosis not present

## 2020-07-24 DIAGNOSIS — Z9842 Cataract extraction status, left eye: Secondary | ICD-10-CM

## 2020-07-24 DIAGNOSIS — I69952 Hemiplegia and hemiparesis following unspecified cerebrovascular disease affecting left dominant side: Secondary | ICD-10-CM | POA: Diagnosis not present

## 2020-07-24 DIAGNOSIS — E785 Hyperlipidemia, unspecified: Secondary | ICD-10-CM | POA: Diagnosis present

## 2020-07-24 DIAGNOSIS — I6602 Occlusion and stenosis of left middle cerebral artery: Secondary | ICD-10-CM | POA: Diagnosis not present

## 2020-07-24 DIAGNOSIS — R918 Other nonspecific abnormal finding of lung field: Secondary | ICD-10-CM | POA: Diagnosis not present

## 2020-07-24 DIAGNOSIS — R29818 Other symptoms and signs involving the nervous system: Secondary | ICD-10-CM | POA: Diagnosis not present

## 2020-07-24 DIAGNOSIS — I4891 Unspecified atrial fibrillation: Secondary | ICD-10-CM | POA: Diagnosis not present

## 2020-07-24 DIAGNOSIS — G309 Alzheimer's disease, unspecified: Secondary | ICD-10-CM | POA: Diagnosis not present

## 2020-07-24 DIAGNOSIS — Z8249 Family history of ischemic heart disease and other diseases of the circulatory system: Secondary | ICD-10-CM

## 2020-07-24 DIAGNOSIS — R4781 Slurred speech: Secondary | ICD-10-CM | POA: Diagnosis not present

## 2020-07-24 DIAGNOSIS — Z79899 Other long term (current) drug therapy: Secondary | ICD-10-CM

## 2020-07-24 DIAGNOSIS — Z885 Allergy status to narcotic agent status: Secondary | ICD-10-CM

## 2020-07-24 DIAGNOSIS — Z87442 Personal history of urinary calculi: Secondary | ICD-10-CM

## 2020-07-24 DIAGNOSIS — I6523 Occlusion and stenosis of bilateral carotid arteries: Secondary | ICD-10-CM | POA: Diagnosis not present

## 2020-07-24 DIAGNOSIS — Z9841 Cataract extraction status, right eye: Secondary | ICD-10-CM

## 2020-07-24 DIAGNOSIS — I350 Nonrheumatic aortic (valve) stenosis: Secondary | ICD-10-CM | POA: Diagnosis present

## 2020-07-24 LAB — DIFFERENTIAL
Abs Immature Granulocytes: 0.01 10*3/uL (ref 0.00–0.07)
Basophils Absolute: 0 10*3/uL (ref 0.0–0.1)
Basophils Relative: 0 %
Eosinophils Absolute: 0.1 10*3/uL (ref 0.0–0.5)
Eosinophils Relative: 2 %
Immature Granulocytes: 0 %
Lymphocytes Relative: 36 %
Lymphs Abs: 2.3 10*3/uL (ref 0.7–4.0)
Monocytes Absolute: 0.5 10*3/uL (ref 0.1–1.0)
Monocytes Relative: 8 %
Neutro Abs: 3.5 10*3/uL (ref 1.7–7.7)
Neutrophils Relative %: 54 %

## 2020-07-24 LAB — CBC
HCT: 35.3 % — ABNORMAL LOW (ref 36.0–46.0)
Hemoglobin: 11.9 g/dL — ABNORMAL LOW (ref 12.0–15.0)
MCH: 33.4 pg (ref 26.0–34.0)
MCHC: 33.7 g/dL (ref 30.0–36.0)
MCV: 99.2 fL (ref 80.0–100.0)
Platelets: 167 10*3/uL (ref 150–400)
RBC: 3.56 MIL/uL — ABNORMAL LOW (ref 3.87–5.11)
RDW: 13.2 % (ref 11.5–15.5)
WBC: 6.5 10*3/uL (ref 4.0–10.5)
nRBC: 0 % (ref 0.0–0.2)

## 2020-07-24 LAB — COMPREHENSIVE METABOLIC PANEL
ALT: 19 U/L (ref 0–44)
AST: 23 U/L (ref 15–41)
Albumin: 3.6 g/dL (ref 3.5–5.0)
Alkaline Phosphatase: 43 U/L (ref 38–126)
Anion gap: 11 (ref 5–15)
BUN: 18 mg/dL (ref 8–23)
CO2: 27 mmol/L (ref 22–32)
Calcium: 8.9 mg/dL (ref 8.9–10.3)
Chloride: 98 mmol/L (ref 98–111)
Creatinine, Ser: 1.26 mg/dL — ABNORMAL HIGH (ref 0.44–1.00)
GFR calc Af Amer: 44 mL/min — ABNORMAL LOW (ref >60)
GFR calc non Af Amer: 38 mL/min — ABNORMAL LOW (ref >60)
Glucose, Bld: 103 mg/dL — ABNORMAL HIGH (ref 70–99)
Potassium: 2.4 mmol/L — CL (ref 3.5–5.1)
Sodium: 136 mmol/L (ref 135–145)
Total Bilirubin: 1 mg/dL (ref 0.3–1.2)
Total Protein: 6.4 g/dL — ABNORMAL LOW (ref 6.5–8.1)

## 2020-07-24 LAB — ETHANOL: Alcohol, Ethyl (B): <10 mg/dL (ref <10)

## 2020-07-24 LAB — I-STAT CHEM 8, ED
BUN: 17 mg/dL (ref 8–23)
Calcium, Ion: 1.16 mmol/L (ref 1.15–1.40)
Chloride: 97 mmol/L — ABNORMAL LOW (ref 98–111)
Creatinine, Ser: 1.3 mg/dL — ABNORMAL HIGH (ref 0.44–1.00)
Glucose, Bld: 100 mg/dL — ABNORMAL HIGH (ref 70–99)
HCT: 35 % — ABNORMAL LOW (ref 36.0–46.0)
Hemoglobin: 11.9 g/dL — ABNORMAL LOW (ref 12.0–15.0)
Potassium: 2.4 mmol/L — CL (ref 3.5–5.1)
Sodium: 139 mmol/L (ref 135–145)
TCO2: 28 mmol/L (ref 22–32)

## 2020-07-24 LAB — CBG MONITORING, ED: Glucose-Capillary: 82 mg/dL (ref 70–99)

## 2020-07-24 LAB — SARS CORONAVIRUS 2 BY RT PCR (HOSPITAL ORDER, PERFORMED IN ~~LOC~~ HOSPITAL LAB): SARS Coronavirus 2: NEGATIVE

## 2020-07-24 LAB — APTT: aPTT: 35 seconds (ref 24–36)

## 2020-07-24 LAB — MAGNESIUM: Magnesium: 2.3 mg/dL (ref 1.7–2.4)

## 2020-07-24 LAB — PROTIME-INR
INR: 1.2 (ref 0.8–1.2)
Prothrombin Time: 14.7 seconds (ref 11.4–15.2)

## 2020-07-24 MED ORDER — POTASSIUM CHLORIDE 10 MEQ/100ML IV SOLN
10.0000 meq | INTRAVENOUS | Status: AC
  Administered 2020-07-24 (×2): 10 meq via INTRAVENOUS
  Filled 2020-07-24 (×2): qty 100

## 2020-07-24 MED ORDER — ENOXAPARIN SODIUM 30 MG/0.3ML ~~LOC~~ SOLN: 30.0000 mg | SUBCUTANEOUS | Status: DC

## 2020-07-24 MED ORDER — ACETAMINOPHEN 650 MG RE SUPP: 650.0000 mg | RECTAL | Status: DC | PRN

## 2020-07-24 MED ORDER — ENOXAPARIN SODIUM 40 MG/0.4ML ~~LOC~~ SOLN: 40.0000 mg | SUBCUTANEOUS | Status: DC

## 2020-07-24 MED ORDER — ACETAMINOPHEN 160 MG/5ML PO SOLN: 650.0000 mg | ORAL | Status: DC | PRN

## 2020-07-24 MED ORDER — ROSUVASTATIN CALCIUM 20 MG PO TABS
20.0000 mg | ORAL_TABLET | Freq: Every day | ORAL | Status: DC
  Administered 2020-07-24 – 2020-07-26 (×3): 20 mg via ORAL
  Filled 2020-07-24 (×3): qty 1

## 2020-07-24 MED ORDER — APIXABAN 2.5 MG PO TABS
2.5000 mg | ORAL_TABLET | Freq: Two times a day (BID) | ORAL | Status: DC
  Administered 2020-07-24 – 2020-07-26 (×4): 2.5 mg via ORAL
  Filled 2020-07-24 (×4): qty 1

## 2020-07-24 MED ORDER — IOHEXOL 350 MG/ML SOLN
40.0000 mL | Freq: Once | INTRAVENOUS | Status: AC | PRN
  Administered 2020-07-24: 40 mL via INTRAVENOUS

## 2020-07-24 MED ORDER — MIRTAZAPINE 15 MG PO TABS
7.5000 mg | ORAL_TABLET | Freq: Every day | ORAL | Status: DC
  Administered 2020-07-24 – 2020-07-25 (×2): 15 mg via ORAL
  Filled 2020-07-24 (×2): qty 1

## 2020-07-24 MED ORDER — STROKE: EARLY STAGES OF RECOVERY BOOK: Freq: Once | Status: DC

## 2020-07-24 MED ORDER — AMIODARONE HCL 200 MG PO TABS
200.0000 mg | ORAL_TABLET | ORAL | Status: DC
  Administered 2020-07-26: 200 mg via ORAL
  Filled 2020-07-24: qty 1

## 2020-07-24 MED ORDER — SODIUM CHLORIDE 0.9 % IV SOLN: INTRAVENOUS | Status: DC

## 2020-07-24 MED ORDER — POTASSIUM CHLORIDE 10 MEQ/100ML IV SOLN
10.0000 meq | Freq: Once | INTRAVENOUS | Status: AC
  Administered 2020-07-24: 10 meq via INTRAVENOUS
  Filled 2020-07-24: qty 100

## 2020-07-24 MED ORDER — ENOXAPARIN SODIUM 40 MG/0.4ML ~~LOC~~ SOLN
40.0000 mg | SUBCUTANEOUS | Status: DC
  Administered 2020-07-24: 40 mg via SUBCUTANEOUS
  Filled 2020-07-24: qty 0.4

## 2020-07-24 MED ORDER — POTASSIUM CHLORIDE CRYS ER 20 MEQ PO TBCR
40.0000 meq | EXTENDED_RELEASE_TABLET | Freq: Once | ORAL | Status: AC
  Administered 2020-07-24: 40 meq via ORAL
  Filled 2020-07-24: qty 2

## 2020-07-24 MED ORDER — SENNOSIDES-DOCUSATE SODIUM 8.6-50 MG PO TABS: 1.0000 | ORAL_TABLET | Freq: Every evening | ORAL | Status: DC | PRN

## 2020-07-24 MED ORDER — IOHEXOL 350 MG/ML SOLN
60.0000 mL | Freq: Once | INTRAVENOUS | Status: AC | PRN
  Administered 2020-07-24: 60 mL via INTRAVENOUS

## 2020-07-24 MED ORDER — METOPROLOL TARTRATE 25 MG PO TABS
12.5000 mg | ORAL_TABLET | Freq: Two times a day (BID) | ORAL | Status: DC
  Administered 2020-07-24 – 2020-07-26 (×4): 12.5 mg via ORAL
  Filled 2020-07-24 (×4): qty 1

## 2020-07-24 MED ORDER — ACETAMINOPHEN 325 MG PO TABS
650.0000 mg | ORAL_TABLET | ORAL | Status: DC | PRN
  Administered 2020-07-25: 650 mg via ORAL
  Filled 2020-07-24: qty 2

## 2020-07-24 MED ORDER — HYDRALAZINE HCL 20 MG/ML IJ SOLN: 10.0000 mg | Freq: Four times a day (QID) | INTRAMUSCULAR | Status: DC | PRN

## 2020-07-24 MED ORDER — ASPIRIN EC 81 MG PO TBEC
81.0000 mg | DELAYED_RELEASE_TABLET | Freq: Every day | ORAL | Status: DC
  Administered 2020-07-24 – 2020-07-26 (×3): 81 mg via ORAL
  Filled 2020-07-24 (×3): qty 1

## 2020-07-24 NOTE — ED Provider Notes (Signed)
[facility] EMERGENCY DEPARTMENT Provider Note   CSN: [id number] Arrival date & time: 07/24/20  1158  An emergency department physician performed an initial assessment on this suspected stroke patient at 1206.  History No chief complaint on file.   Catherine Anthony is a 86 y.o. female.  Patient presents with left-sided weakness starting at 1030 this morning.  Patient woke up at 8:00 this morning and felt fine.  No history of stroke however patient does have a history of atrial fibrillation on Eliquis and aortic valve replacement.  No fevers chills cough or shortness of breath.  No difficulty speaking.  Patient is compliant with their medications.        Past Medical History:  Diagnosis Date  . Arthritis    Right hand  . Complication of anesthesia   . Coronary artery disease    s/p mutliple PCIs  . Family hx of colon cancer   . High cholesterol   . History of kidney stones   . Hypertension   . PAF (paroxysmal atrial fibrillation) (HCC)    on Eliquis  . S/P TAVR (transcatheter aortic valve replacement)   . Severe aortic stenosis     Patient Active Problem List   Diagnosis Date Noted  . Leg edema, left 12/11/2019  . Frequent unifocal PVCs 08/09/2019  . Indication present for endocarditis prophylaxis 08/09/2019  . Mixed hyperlipidemia 08/01/2019  . Acute on chronic diastolic heart failure (HCC) 06/06/2019  . S/P TAVR (transcatheter aortic valve replacement) 06/06/2019  . Essential hypertension   . High cholesterol   . Coronary artery disease   . PAF (paroxysmal atrial fibrillation) (HCC)   . Moderate aortic regurgitation   . Coronary artery disease with stable angina pectoris (HCC) 08/31/2017    Past Surgical History:  Procedure Laterality Date  . CATARACT EXTRACTION W/ INTRAOCULAR LENS IMPLANT Right 06/04/2011   [facility]  . CATARACT EXTRACTION W/PHACO  06/22/2011   Procedure: CATARACT EXTRACTION PHACO AND INTRAOCULAR LENS PLACEMENT (IOC);  Surgeon: Gemma Payor;   Location: [facility];  Service: Ophthalmology;  Laterality: Left;  CDE 14.34  . COLONOSCOPY  06/23/08  . COLONOSCOPY N/A 01/18/2014   Procedure: COLONOSCOPY;  Surgeon: Malissa Hippo, MD;  Location: [facility] END[facility] SUITE;  Service: Endoscopy;  Laterality: N/A;  145  . CORONARY ANGIOPLASTY WITH STENT PLACEMENT    . CORONARY ATHERECTOMY N/A 09/21/2017   Procedure: CORONARY ATHERECTOMY;  Surgeon: Elder Negus, MD;  Location: [facility] INVASIVE CV LAB;  Service: Cardiovascular;  Laterality: N/A;  . CORONARY STENT INTERVENTION N/A 08/31/2017   Procedure: CORONARY STENT INTERVENTION;  Surgeon: Elder Negus, MD;  Location: [facility] INVASIVE CV LAB;  Service: Cardiovascular;  Laterality: N/A;  . CORONARY STENT INTERVENTION N/A 09/21/2017   Procedure: CORONARY STENT INTERVENTION;  Surgeon: Elder Negus, MD;  Location: [facility] INVASIVE CV LAB;  Service: Cardiovascular;  Laterality: N/A;  . EYE SURGERY Bilateral    "after cataract OR"  . INTRAVASCULAR ULTRASOUND/IVUS N/A 09/21/2017   Procedure: Intravascular Ultrasound/IVUS;  Surgeon: Elder Negus, MD;  Location: [facility] CV LAB;  Service: Cardiovascular;  Laterality: N/A;  . RIGHT/LEFT HEART CATH AND CORONARY ANGIOGRAPHY N/A 08/31/2017   Procedure: RIGHT/LEFT HEART CATH AND CORONARY ANGIOGRAPHY;  Surgeon: Elder Negus, MD;  Location: [facility] CV [facility];  Service: Cardiovascular;  Laterality: N/A;  . RIGHT/LEFT HEART CATH AND CORONARY ANGIOGRAPHY N/A 05/16/2019   Procedure: RIGHT/LEFT HEART CATH AND CORONARY ANGIOGRAPHY;  Surgeon: Elder Negus, MD;  Location: [facility];  Service:  Cardiovascular;  Laterality: N/A;  . TEE WITHOUT CARDIOVERSION N/A 06/06/2019   Procedure: TRANSESOPHAGEAL ECHOCARDIOGRAM (TEE);  Surgeon: Tonny Bollman, MD;  Location: [facility] INVASIVE CV LAB;  Service: Open Heart Surgery;  Laterality: N/A;  . TRANSCATHETER AORTIC VALVE REPLACEMENT, TRANSFEMORAL N/A 06/06/2019   Procedure: TRANSCATHETER AORTIC VALVE  REPLACEMENT, TRANSFEMORAL;  Surgeon: Tonny Bollman, MD;  Location: [facility] INVASIVE CV LAB;  Service: Open Heart Surgery;  Laterality: N/A;  . ULTRASOUND GUIDANCE FOR VASCULAR ACCESS  09/21/2017   Procedure: Ultrasound Guidance For Vascular Access;  Surgeon: Elder Negus, MD;  Location: [facility] INVAS[facility] CV LAB;  Service: Cardiovascular;;  . VARICOSE VEIN SURGERY Bilateral 1960s   [facility]     OB History   No obstetric history on file.     Family History  Problem Relation Age of Onset  . Hypertension Mother     Social History   Tobacco Use  . Smoking status: Never Smoker  . Smokeless tobacco: Never Used  Vaping Use  . Vaping Use: Never used  Substance Use Topics  . Alcohol use: No  . Drug use: No    Home Medications Prior to Admission medications   Medication Sig Start Date End Date Taking? Authorizing Provider  amiodarone (PACERONE) 200 MG tablet Take 200 mg by mouth as directed. Monday and Friday    [provider]  ELIQUIS 2.5 MG TABS tablet TAKE (1) TABLET BY MOUTH TWICE DAILY. 06/24/20   Patwardhan, Anabel Bene, MD  furosemide (LASIX) 20 MG tablet Take 1 tablet (20 mg total) by mouth daily. 06/10/20 09/08/20  Patwardhan, Anabel Bene, MD  hydrALAZINE (APRESOLINE) 25 MG tablet Take 1 tablet (25 mg total) by mouth 3 (three) times daily. 05/15/20 08/13/20  Patwardhan, Anabel Bene, MD  lisinopril (PRINIVIL,ZESTRIL) 40 MG tablet Take 40 mg by mouth daily.     [provider]  metoprolol tartrate (LOPRESSOR) 25 MG tablet TAKE (1) TABLET BY MOUTH TWICE DAILY. 07/22/20   Patwardhan, Anabel Bene, MD  mirtazapine (REMERON) 15 MG tablet Take 7.5-15 mg by mouth at bedtime. 05/15/20   [provider]  nitroGLYCERIN (NITROSTAT) 0.4 MG SL tablet Place 0.4 mg under the tongue every 5 (five) minutes x 3 doses as needed for chest pain.     [provider]  rosuvastatin (CRESTOR) 20 MG tablet TAKE (1) TABLET BY MOUTH ONCE DAILY. 06/24/20   Patwardhan, Anabel Bene, MD  VITAMIN D,  CHOLECALCIFEROL, PO Take 1 tablet by mouth daily.    [provider]    Allergies    Codeine  Review of Systems   Review of Systems  Constitutional: Negative for chills and fever.  HENT: Negative for congestion.   Eyes: Negative for visual disturbance.  Respiratory: Negative for shortness of breath.   Cardiovascular: Negative for chest pain.  Gastrointestinal: Negative for abdominal pain and vomiting.  Genitourinary: Negative for dysuria and flank pain.  Musculoskeletal: Negative for back pain, neck pain and neck stiffness.  Skin: Negative for rash.  Neurological: Positive for weakness and numbness. Negative for light-headedness and headaches.    Physical Exam Updated Vital Signs BP (!) 166/91   Pulse 80   Temp 98.4 F (36.9 C)   Wt 41.9 kg   SpO2 99%   BMI 15.86 kg/m   Physical Exam Vitals and nursing note reviewed.  Constitutional:      Appearance: She is well-developed.  HENT:     Head: Normocephalic and atraumatic.  Eyes:     General:  Right eye: No discharge.        Left eye: No discharge.     Conjunctiva/sclera: Conjunctivae normal.  Neck:     Trachea: No tracheal deviation.  Cardiovascular:     Rate and Rhythm: Normal rate and regular rhythm.  Pulmonary:     Effort: Pulmonary effort is normal.     Breath sounds: Normal breath sounds.  Abdominal:     General: There is no distension.     Palpations: Abdomen is soft.     Tenderness: There is no abdominal tenderness. There is no guarding.  Musculoskeletal:     Cervical back: Normal range of motion and neck supple.  Skin:    General: Skin is warm.     Findings: No rash.  Neurological:     Mental Status: She is alert and oriented to person, place, and time.     GCS: GCS eye subscore is 4. GCS verbal subscore is 5. GCS motor subscore is 6.     Cranial Nerves: No cranial nerve deficit.     Motor: Weakness present.     Comments: Patient has left arm and left leg weakness.  Patient cannot  hold either against gravity.  Normal strength 5+ right side.  Mild left facial droop.  No difficulty with speech or vision at this time.  Psychiatric:        Mood and Affect: Mood normal.     ED Results / Procedures / Treatments   Labs (all labs ordered are listed, but only abnormal results are displayed) Labs Reviewed  CBC - Abnormal; Notable for the following components:      Result Value   RBC 3.56 (*)    Hemoglobin 11.9 (*)    HCT 35.3 (*)    All other components within normal limits  COMPREHENSIVE METABOLIC PANEL - Abnormal; Notable for the following components:   Potassium 2.4 (*)    Glucose, Bld 103 (*)    Creatinine, Ser 1.26 (*)    Total Protein 6.4 (*)    GFR calc non Af Amer 38 (*)    GFR calc Af Amer 44 (*)    All other components within normal limits  I-STAT CHEM 8, ED - Abnormal; Notable for the following components:   Potassium 2.4 (*)    Chloride 97 (*)    Creatinine, Ser 1.30 (*)    Glucose, Bld 100 (*)    Hemoglobin 11.9 (*)    HCT 35.0 (*)    All other components within normal limits  SARS CORONAVIRUS 2 BY RT PCR (HOSPITAL ORDER, PERFORMED IN [facility])  ETHANOL  PROTIME-INR  APTT  DIFFERENTIAL  MAGNESIUM  RAPID URINE DRUG SCREEN, HOSP PERFORMED  URINALYSIS, ROUTINE W REFLEX MICROSCOPIC  CBG MONITORING, ED    EKG None  Radiology CT Angio Head W or Wo Contrast  Result Date: 07/24/2020 CLINICAL DATA:  Left-sided weakness EXAM: CT ANGIOGRAPHY HEAD AND NECK CT PERFUSION BRAIN TECHNIQUE: Multidetector CT imaging of the head and neck was performed using the standard protocol during bolus administration of intravenous contrast. Multiplanar CT image reconstructions and MIPs were obtained to evaluate the vascular anatomy. Carotid stenosis measurements (when applicable) are obtained utilizing NASCET criteria, using the distal internal carotid diameter as the denominator. Multiphase CT imaging of the brain was performed following IV bolus  contrast injection. Subsequent parametric perfusion maps were calculated using RAPID software. CONTRAST:  49mL OMNIPAQUE IOHEXOL 350 MG/ML SOLN, 40mL OMNIPAQUE IOHEXOL 350 MG/ML SOLN COMPARISON:  None. FINDINGS:  CTA NECK FINDINGS Aortic arch: Great vessel origins are patent with calcified plaque present. Right carotid system: Patent. Mixed but primarily calcified plaque at the ICA origin causing approximately 50% stenosis. Left carotid system: Patent. Mixed but primarily calcified plaque at the ICA origin causing less than 50% stenosis. Vertebral arteries: Patent. Right vertebral artery is dominant. There is calcified plaque at the left vertebral origin with potential significant stenosis. Skeleton: Degenerative changes of the cervical spine. Other neck: No mass or adenopathy. Upper chest: No apical lung mass Review of the MIP images confirms the above findings CTA HEAD FINDINGS Anterior circulation: Intracranial internal carotid arteries are patent with mild calcified plaque. Anterior and middle cerebral arteries are patent. Right A1 ACA is dominant. Mild stenosis of the left M1 MCA. Posterior circulation: Intracranial vertebral arteries are patent with mild atherosclerotic irregularity. Patent PICA origins. Basilar artery is patent. Posterior cerebral arteries are patent. Large bilateral posterior communicating arteries. Venous sinuses: As permitted by contrast timing, patent. Review of the MIP images confirms the above findings CT Brain Perfusion Findings: CBF (<30%) Volume: 0mL Perfusion (Tmax>6.0s) volume: 0mL Mismatch Volume: 0mL Infarction Location:None. IMPRESSION: No large vessel occlusion. Perfusion imaging demonstrates no evidence of core infarction or territory at risk. Primarily calcified plaque at the ICA origins causing approximately 50% stenosis on the right and less than 50% stenosis on the left. Mild stenosis of the intracranial carotids and left M1 MCA. Electronically Signed   By: Guadlupe Spanish  M.D.   On: 07/24/2020 12:49   CT Angio Neck W and/or Wo Contrast  Result Date: 07/24/2020 CLINICAL DATA:  Left-sided weakness EXAM: CT ANGIOGRAPHY HEAD AND NECK CT PERFUSION BRAIN TECHNIQUE: Multidetector CT imaging of the head and neck was performed using the standard protocol during bolus administration of intravenous contrast. Multiplanar CT image reconstructions and MIPs were obtained to evaluate the vascular anatomy. Carotid stenosis measurements (when applicable) are obtained utilizing NASCET criteria, using the distal internal carotid diameter as the denominator. Multiphase CT imaging of the brain was performed following IV bolus contrast injection. Subsequent parametric perfusion maps were calculated using RAPID software. CONTRAST:  40mL OMNIPAQUE IOHEXOL 350 MG/ML SOLN, 60mL OMNIPAQUE IOHEXOL 350 MG/ML SOLN COMPARISON:  None. FINDINGS: CTA NECK FINDINGS Aortic arch: Great vessel origins are patent with calcified plaque present. Right carotid system: Patent. Mixed but primarily calcified plaque at the ICA origin causing approximately 50% stenosis. Left carotid system: Patent. Mixed but primarily calcified plaque at the ICA origin causing less than 50% stenosis. Vertebral arteries: Patent. Right vertebral artery is dominant. There is calcified plaque at the left vertebral origin with potential significant stenosis. Skeleton: Degenerative changes of the cervical spine. Other neck: No mass or adenopathy. Upper chest: No apical lung mass Review of the MIP images confirms the above findings CTA HEAD FINDINGS Anterior circulation: Intracranial internal carotid arteries are patent with mild calcified plaque. Anterior and middle cerebral arteries are patent. Right A1 ACA is dominant. Mild stenosis of the left M1 MCA. Posterior circulation: Intracranial vertebral arteries are patent with mild atherosclerotic irregularity. Patent PICA origins. Basilar artery is patent. Posterior cerebral arteries are patent. Large  bilateral posterior communicating arteries. Venous sinuses: As permitted by contrast timing, patent. Review of the MIP images confirms the above findings CT Brain Perfusion Findings: CBF (<30%) Volume: 0mL Perfusion (Tmax>6.0s) volume: 0mL Mismatch Volume: 0mL Infarction Location:None. IMPRESSION: No large vessel occlusion. Perfusion imaging demonstrates no evidence of core infarction or territory at risk. Primarily calcified plaque at the ICA origins causing approximately 50%  stenosis on the right and less than 50% stenosis on the left. Mild stenosis of the intracranial carotids and left M1 MCA. Electronically Signed   By: Guadlupe SpanishPraneil  Patel M.D.   On: 07/24/2020 12:49   CT CEREBRAL PERFUSION W CONTRAST  Result Date: 07/24/2020 CLINICAL DATA:  Left-sided weakness EXAM: CT ANGIOGRAPHY HEAD AND NECK CT PERFUSION BRAIN TECHNIQUE: Multidetector CT imaging of the head and neck was performed using the standard protocol during bolus administration of intravenous contrast. Multiplanar CT image reconstructions and MIPs were obtained to evaluate the vascular anatomy. Carotid stenosis measurements (when applicable) are obtained utilizing NASCET criteria, using the distal internal carotid diameter as the denominator. Multiphase CT imaging of the brain was performed following IV bolus contrast injection. Subsequent parametric perfusion maps were calculated using RAPID software. CONTRAST:  40mL OMNIPAQUE IOHEXOL 350 MG/ML SOLN, 60mL OMNIPAQUE IOHEXOL 350 MG/ML SOLN COMPARISON:  None. FINDINGS: CTA NECK FINDINGS Aortic arch: Great vessel origins are patent with calcified plaque present. Right carotid system: Patent. Mixed but primarily calcified plaque at the ICA origin causing approximately 50% stenosis. Left carotid system: Patent. Mixed but primarily calcified plaque at the ICA origin causing less than 50% stenosis. Vertebral arteries: Patent. Right vertebral artery is dominant. There is calcified plaque at the left vertebral  origin with potential significant stenosis. Skeleton: Degenerative changes of the cervical spine. Other neck: No mass or adenopathy. Upper chest: No apical lung mass Review of the MIP images confirms the above findings CTA HEAD FINDINGS Anterior circulation: Intracranial internal carotid arteries are patent with mild calcified plaque. Anterior and middle cerebral arteries are patent. Right A1 ACA is dominant. Mild stenosis of the left M1 MCA. Posterior circulation: Intracranial vertebral arteries are patent with mild atherosclerotic irregularity. Patent PICA origins. Basilar artery is patent. Posterior cerebral arteries are patent. Large bilateral posterior communicating arteries. Venous sinuses: As permitted by contrast timing, patent. Review of the MIP images confirms the above findings CT Brain Perfusion Findings: CBF (<30%) Volume: 0mL Perfusion (Tmax>6.0s) volume: 0mL Mismatch Volume: 0mL Infarction Location:None. IMPRESSION: No large vessel occlusion. Perfusion imaging demonstrates no evidence of core infarction or territory at risk. Primarily calcified plaque at the ICA origins causing approximately 50% stenosis on the right and less than 50% stenosis on the left. Mild stenosis of the intracranial carotids and left M1 MCA. Electronically Signed   By: Guadlupe SpanishPraneil  Patel M.D.   On: 07/24/2020 12:49   CT HEAD CODE STROKE WO CONTRAST  Result Date: 07/24/2020 CLINICAL DATA:  Code stroke.  Left-sided weakness EXAM: CT HEAD WITHOUT CONTRAST TECHNIQUE: Contiguous axial images were obtained from the base of the skull through the vertex without intravenous contrast. COMPARISON:  None. FINDINGS: Brain: There is no acute intracranial hemorrhage, mass effect, or edema. There is no acute appearing loss of gray-white differentiation. Prominence of the ventricles and sulci reflects generalized parenchymal volume loss. There is ex vacuo dilatation of the frontal horn of the right lateral ventricle contiguous with an infarct.  Patchy and confluent areas of hypoattenuation in the supratentorial white matter are nonspecific but probably reflect moderate chronic microvascular ischemic changes. There are age-indeterminate small vessel infarcts of the right cerebellum. Vascular: Mild focal hyperdensity along a proximal right M2 MCA branch. There is intracranial atherosclerotic calcification at the skull base. Skull: Unremarkable. Sinuses/Orbits: No acute finding. Other: Mastoid air cells are clear. ASPECTS [facility]([place] Stroke Program Early CT Score) - Ganglionic level infarction (caudate, lentiform nuclei, internal capsule, insula, M1-M3 cortex): 7 - Supraganglionic infarction (M4-M6 cortex): 3 Total score (  0-10 with 10 being normal): 10 IMPRESSION: No acute intracranial hemorrhage or evidence of acute infarction. ASPECT score is 10. Age-indeterminate small vessel infarcts of the right cerebellum. Focus of mild hyperdensity along a proximal right M2 MCA branch may be artifactual. Correlation will be made with forthcoming CT vascular imaging. These results were called by telephone at the time of interpretation on 07/24/2020 at 12:22 pm to provider [facility] , who verbally acknowledged these results. Electronically Signed   By: Guadlupe Spanish M.D.   On: 07/24/2020 12:27    Procedures .Critical Care Performed by: Blane Ohara, MD Authorized by: Blane Ohara, MD   Critical care provider statement:    Critical care time (minutes):  32   Critical care start time:  07/24/2020 12:03 PM   Critical care end time:  07/24/2020 12:35 PM   Critical care was time spent personally by me on the following activities:  Discussions with consultants, evaluation of patient's response to treatment, examination of patient, ordering and performing treatments and interventions, ordering and review of laboratory studies, ordering and review of radiographic studies, pulse oximetry, re-evaluation of patient's condition, review of old charts and obtaining  history from patient or surrogate   (including critical care time)  Medications Ordered in ED Medications  potassium chloride 10 mEq in 100 mL IVPB (10 mEq Intravenous New Bag/Given 07/24/20 1247)  iohexol (OMNIPAQUE) 350 MG/ML injection 60 mL (60 mLs Intravenous Contrast Given 07/24/20 1230)  iohexol (OMNIPAQUE) 350 MG/ML injection 40 mL (40 mLs Intravenous Contrast Given 07/24/20 1229)  potassium chloride SA (KLOR-CON) CR tablet 40 mEq (40 mEq Oral Given 07/24/20 1247)    ED Course  I have reviewed the triage vital signs and the nursing notes.  Pertinent labs & imaging results that were available during my care of the patient were reviewed by me and considered in my medical decision making (see chart for details).    MDM Rules/Calculators/A&P                          Patient presents with clinical concern for acute significant stroke with left arm and left leg weakness and history of A. fib and heart valve.  Patient is on Eliquis is not a candidate for TPA however normally would be.  CT scan results reviewed and discussed with radiology no acute bleeding.  CT angiogram pending.  General blood work sent.  Covid sent for admission.  Patient persistently left-sided weakness.  Discussed with neurology plan for admission MRI for further details.  Discussed with hospitalist for admission.  Blood work reviewed and showed potassium 2.4, IV and oral potassium ordered.  Glucose normal.  Hemoglobin 9.9.  Neurologist recommends holding anticoagulant to decrease chance of hemorrhagic conversion with significant abnormal exam. MRI pending.  Final Clinical Impression(s) / ED Diagnoses Final diagnoses:  Acute cardioembolic stroke (HCC)  Acute left-sided weakness  Hypokalemia    Rx / DC Orders ED Discharge Orders    None       Blane Ohara, MD 07/24/20 1301

## 2020-07-24 NOTE — H&P (Signed)
History and Physical  Catherine Anthony Anthony:[id number] DOB: [birth year] DOA: 07/24/2020   PCP: [name], MD   Patient coming from: Home  Chief Complaint: left sided weakness  HPI:  Catherine Anthony is a 86 y.o. 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  The patient woke up around 8 AM in the morning of 07/24/2020 and she was normal at that time.  The patient was sitting on her couch trying to get up to the kitchen when she had difficulty getting up and noted the left-sided weakness.  The patient denied any visual disturbance, headache, dysarthria, or dysesthesias.  She denied any fevers, chills, chest pain, shortness of breath, coughing, hemoptysis, nausea, vomiting, diarrhea, abdominal pain, dysuria, hematuria. EMS was activated, and code stroke was initiated. In the emergency department, the patient was afebrile hemodynamically stable with oxygen saturation 99% on room air.  BMP was unremarkable except for potassium 2.4.  LFTs were unremarkable.  CBC and LFTs were unremarkable.  CT of the brain was negative for any acute findings but showed an age-indeterminate infarct in the right cerebellum.  CT brain perfusion was negative for LVO.  There was no evidence of core infarct or territory at risk.  CT head and neck showed 50% stenosis of the right ICA and less than 50% stenosis of the left ICA.  Neurology was consulted to assist with management.  Assessment/Plan: Left hemiplegia -Concerned about right MCA stroke -Neurology Consult -PT/OT evaluation -Speech therapy eval -CT brain--neg for acute finding; age indeterminant R-cerebellar infarct -MRI brain-- -MRA brain-- -CTA H&N--50% R-ICA stenosis;  <50% L-ICA stenosis -Echo-- -LDL-- -HbA1C-- -Antiplatelet--ASA 81 mg -Holding Eliquis pending neurology evaluation  Essential  hypertension -Restart lower dose metoprolol -Holding hydralazine, lisinopril to allow for permissive hypertension -hydralazine prn SBP >220  Hyperlipidemia -Continue Crestor -Check lipid panel  CKD stage IIIb -Baseline creatinine 1.0-1.3 -A.m. BMP  Paroxysmal atrial fibrillation -Continue lower dose metoprolol -currently in sinus -Holding apixaban pending neurology evaluation -Restart apixaban once cleared by neurology -Continue amiodarone  Status post TAVR -echo  Hypokalemia -Replete -Check magnesium          Past Medical History:  Diagnosis Date  . Arthritis    Right hand  . Complication of anesthesia   . Coronary artery disease    s/p mutliple PCIs  . Family hx of colon cancer   . High cholesterol   . History of kidney stones   . Hypertension   . PAF (paroxysmal atrial fibrillation) (HCC)    on Eliquis  . S/P TAVR (transcatheter aortic valve replacement)   . Severe aortic stenosis    Past Surgical History:  Procedure Laterality Date  . CATARACT EXTRACTION W/ INTRAOCULAR LENS IMPLANT Right 06/04/2011   [facility]  . CATARACT EXTRACTION W/PHACO  06/22/2011   Procedure: CATARACT EXTRACTION PHACO AND INTRAOCULAR LENS PLACEMENT (IOC);  Surgeon: Gemma Payor;  Location: [facility];  Service: Ophthalmology;  Laterality: Left;  CDE 14.34  . COLONOSCOPY  06/23/08  . COLONOSCOPY N/A 01/18/2014   Procedure: COLONOSCOPY;  Surgeon: Malissa Hippo, MD;  Location: [facility];  Service: Endoscopy;  Laterality: N/A;  145  . CORONARY ANGIOPLASTY WITH STENT PLACEMENT    . CORONARY ATHERECTOMY N/A 09/21/2017   Procedure: CORONARY ATHERECTOMY;  Surgeon: Elder Negus, MD;  Location: [facility];  Service: Cardiovascular;  Laterality: N/A;  . CORONARY  STENT INTERVENTION N/A 08/31/2017   Procedure: CORONARY STENT INTERVENTION;  Surgeon: Elder Negus, MD;  Location: [facility];  Service: Cardiovascular;  Laterality: N/A;  . CORONARY STENT INTERVENTION N/A  09/21/2017   Procedure: CORONARY STENT INTERVENTION;  Surgeon: Elder Negus, MD;  Location: [facility];  Service: Cardiovascular;  Laterality: N/A;  . EYE SURGERY Bilateral    "after cataract OR"  . INTRAVASCULAR ULTRASOUND/IVUS N/A 09/21/2017   Procedure: Intravascular Ultrasound/IVUS;  Surgeon: Elder Negus, MD;  Location: [facility];  Service: Cardiovascular;  Laterality: N/A;  . RIGHT/LEFT HEART CATH AND CORONARY ANGIOGRAPHY N/A 08/31/2017   Procedure: RIGHT/LEFT HEART CATH AND CORONARY ANGIOGRAPHY;  Surgeon: Elder Negus, MD;  Location: [facility];  Service: Cardiovascular;  Laterality: N/A;  . RIGHT/LEFT HEART CATH AND CORONARY ANGIOGRAPHY N/A 05/16/2019   Procedure: RIGHT/LEFT HEART CATH AND CORONARY ANGIOGRAPHY;  Surgeon: Elder Negus, MD;  [facility]: [facility];  Service: Cardiovascular;  Laterality: N/A;  . TEE WITHOUT CARDIOVERSION N/A 06/06/2019   Procedure: TRANSESOPHAGEAL ECHOCARDIOGRAM (TEE);  Surgeon: Tonny Bollman, MD;  Location: [facility] INVASIVE CV LAB;  Service: Open Heart Surgery;  Laterality: N/A;  . TRANSCATHETER AORTIC VALVE REPLACEMENT, TRANSFEMORAL N/A 06/06/2019   Procedure: TRANSCATHETER AORTIC VALVE REPLACEMENT, TRANSFEMORAL;  Surgeon: Tonny Bollman, MD;  Location: [facility] INVASIVE CV LAB;  Service: Open Heart Surgery;  Laterality: N/A;  . ULTRASOUND GUIDANCE FOR VASCULAR ACCESS  09/21/2017   Procedure: Ultrasound Guidance For Vascular Access;  Surgeon: Elder Negus, MD;  Location: [facility];  Service: Cardiovascular;;  . VARICOSE VEIN SURGERY Bilateral 1960s   [facility]   Social History:  reports that she has never smoked. She has never used smokeless tobacco. She reports that she does not drink alcohol and does not use drugs.   Family History  Problem Relation Age of Onset  . Hypertension Mother      Allergies  Allergen Reactions  . Codeine Nausea And Vomiting     Prior to Admission  medications   Medication Sig Start Date End Date Taking? Authorizing Provider  amiodarone (PACERONE) 200 MG tablet Take 200 mg by mouth as directed. Monday and Friday    [provider]  ELIQUIS 2.5 MG TABS tablet TAKE (1) TABLET BY MOUTH TWICE DAILY. 06/24/20   Patwardhan, Anabel Bene, MD  furosemide (LASIX) 20 MG tablet Take 1 tablet (20 mg total) by mouth daily. 06/10/20 09/08/20  Patwardhan, Anabel Bene, MD  hydrALAZINE (APRESOLINE) 25 MG tablet Take 1 tablet (25 mg total) by mouth 3 (three) times daily. 05/15/20 08/13/20  Patwardhan, Anabel Bene, MD  lisinopril (PRINIVIL,ZESTRIL) 40 MG tablet Take 40 mg by mouth daily.     [provider]  metoprolol tartrate (LOPRESSOR) 25 MG tablet TAKE (1) TABLET BY MOUTH TWICE DAILY. 07/22/20   Patwardhan, Anabel Bene, MD  mirtazapine (REMERON) 15 MG tablet Take 7.5-15 mg by mouth at bedtime. 05/15/20   [provider]  nitroGLYCERIN (NITROSTAT) 0.4 MG SL tablet Place 0.4 mg under the tongue every 5 (five) minutes x 3 doses as needed for chest pain.     [provider]  rosuvastatin (CRESTOR) 20 MG tablet TAKE (1) TABLET BY MOUTH ONCE DAILY. 06/24/20   Patwardhan, Anabel Bene, MD  VITAMIN D, CHOLECALCIFEROL, PO Take 1 tablet by mouth daily.    [provider]    Review of Systems:  Constitutional:  No weight loss, night sweats, Fevers, chills, fatigue.  Head&Eyes: No  headache.  No vision loss.  No eye pain or scotoma ENT:  No Difficulty swallowing,Tooth/dental problems,Sore throat,  No ear ache, post nasal drip,  Cardio-vascular:  No chest pain, Orthopnea, PND, swelling in lower extremities,  dizziness, palpitations  GI:  No  abdominal pain, nausea, vomiting, diarrhea, loss of appetite, hematochezia, melena, heartburn, indigestion, Resp:  No shortness of breath with exertion or at rest. No cough. No coughing up of blood .No wheezing.No chest wall deformity  Skin:  no rash or lesions.  GU:  no dysuria, change in color of  urine, no urgency or frequency. No flank pain.  Musculoskeletal:  No joint pain or swelling. No decreased range of motion. No back pain.  Psych:  No change in mood or affect. No depression or anxiety. Neurologic: No headache, no dysesthesia,  no vision loss. No syncope  Physical Exam: Vitals:   07/24/20 1203 07/24/20 1215 07/24/20 1230 07/24/20 1245  BP:   (!) 166/91 (!) 165/85  Pulse:  72 80 76  Resp:    18  Temp: 98.4 F (36.9 C)     SpO2:  97% 99% 97%  Weight:       General:  A&O x 3, NAD, nontoxic, pleasant/cooperative Head/Eye: No conjunctival hemorrhage, no icterus, [place]/AT, No nystagmus ENT:  No icterus,  No thrush, good dentition, no pharyngeal exudate Neck:  No masses, no lymphadenpathy, no bruits CV:  RRR, no rub, no gallop, no S3 Lung:  CTAB, good air movement, no wheeze, no rhonchi Abdomen: soft/NT, +BS, nondistended, no peritoneal signs Ext: No cyanosis, No rashes, No petechiae, No lymphangitis, No edema Neuro: CNII-XII intact, strength 4/5 in R-upper and lower extremities, 0/5 LUE and LLE.  no dysmetria;  Right facial droop and right gaze preference  Labs on Admission:  Basic Metabolic Panel: Recent Labs  Lab 07/24/20 1205 07/24/20 1210  NA 136 139  K 2.4* 2.4*  CL 98 97*  CO2 27  --   GLUCOSE 103* 100*  BUN 18 17  CREATININE 1.26* 1.30*  CALCIUM 8.9  --   MG 2.3  --    Liver Function Tests: Recent Labs  Lab 07/24/20 1205  AST 23  ALT 19  ALKPHOS 43  BILITOT 1.0  PROT 6.4*  ALBUMIN 3.6   No results for input(s): LIPASE, AMYLASE in the last 168 hours. No results for input(s): AMMONIA in the last 168 hours. CBC: Recent Labs  Lab 07/24/20 1205 07/24/20 1210  WBC 6.5  --   NEUTROABS 3.5  --   HGB 11.9* 11.9*  HCT 35.3* 35.0*  MCV 99.2  --   PLT 167  --    Coagulation Profile: Recent Labs  Lab 07/24/20 1205  INR 1.2   Cardiac Enzymes: No results for input(s): CKTOTAL, CKMB, CKMBINDEX, TROPONINI in the last 168 hours. BNP: Invalid  input(s): POCBNP CBG: Recent Labs  Lab 07/24/20 1205  GLUCAP 82   Urine analysis:    Component Value Date/Time   COLORURINE YELLOW 06/01/2019 1144   APPEARANCEUR CLEAR 06/01/2019 1144   LABSPEC 1.011 06/01/2019 1144   PHURINE 5.0 06/01/2019 1144   GLUCOSEU NEGATIVE 06/01/2019 1144   HGBUR MODERATE (A) 06/01/2019 1144   BILIRUBINUR NEGATIVE 06/01/2019 1144   KETONESUR NEGATIVE 06/01/2019 1144   PROTEINUR NEGATIVE 06/01/2019 1144   NITRITE NEGATIVE 06/01/2019 1144   LEUKOCYTESUR MODERATE (A) 06/01/2019 1144   Sepsis Labs: (procalcitonin:4,lacticidven:4) )No results found for this or any previous visit (from the past 240 hour(s)).   Radiological Exams on Admission:  CT Angio Head W or Wo Contrast  Result Date: 07/24/2020 CLINICAL DATA:  Left-sided weakness EXAM: CT ANGIOGRAPHY HEAD AND NECK CT PERFUSION BRAIN TECHNIQUE: Multidetector CT imaging of the head and neck was performed using the standard protocol during bolus administration of intravenous contrast. Multiplanar CT image reconstructions and MIPs were obtained to evaluate the vascular anatomy. Carotid stenosis measurements (when applicable) are obtained utilizing NASCET criteria, using the distal internal carotid diameter as the denominator. Multiphase CT imaging of the brain was performed following IV bolus contrast injection. Subsequent parametric perfusion maps were calculated using RAPID software. CONTRAST:  40mL OMNIPAQUE IOHEXOL 350 MG/ML SOLN, 60mL OMNIPAQUE IOHEXOL 350 MG/ML SOLN COMPARISON:  None. FINDINGS: CTA NECK FINDINGS Aortic arch: Great vessel origins are patent with calcified plaque present. Right carotid system: Patent. Mixed but primarily calcified plaque at the ICA origin causing approximately 50% stenosis. Left carotid system: Patent. Mixed but primarily calcified plaque at the ICA origin causing less than 50% stenosis. Vertebral arteries: Patent. Right vertebral artery is dominant. There is calcified  plaque at the left vertebral origin with potential significant stenosis. Skeleton: Degenerative changes of the cervical spine. Other neck: No mass or adenopathy. Upper chest: No apical lung mass Review of the MIP images confirms the above findings CTA HEAD FINDINGS Anterior circulation: Intracranial internal carotid arteries are patent with mild calcified plaque. Anterior and middle cerebral arteries are patent. Right A1 ACA is dominant. Mild stenosis of the left M1 MCA. Posterior circulation: Intracranial vertebral arteries are patent with mild atherosclerotic irregularity. Patent PICA origins. Basilar artery is patent. Posterior cerebral arteries are patent. Large bilateral posterior communicating arteries. Venous sinuses: As permitted by contrast timing, patent. Review of the MIP images confirms the above findings CT Brain Perfusion Findings: CBF (<30%) Volume: 0mL Perfusion (Tmax>6.0s) volume: 0mL Mismatch Volume: 0mL Infarction Location:None. IMPRESSION: No large vessel occlusion. Perfusion imaging demonstrates no evidence of core infarction or territory at risk. Primarily calcified plaque at the ICA origins causing approximately 50% stenosis on the right and less than 50% stenosis on the left. Mild stenosis of the intracranial carotids and left M1 MCA. Electronically Signed   By: Guadlupe Spanish M.D.   On: 07/24/2020 12:49   CT Angio Neck W and/or Wo Contrast  Result Date: 07/24/2020 CLINICAL DATA:  Left-sided weakness EXAM: CT ANGIOGRAPHY HEAD AND NECK CT PERFUSION BRAIN TECHNIQUE: Multidetector CT imaging of the head and neck was performed using the standard protocol during bolus administration of intravenous contrast. Multiplanar CT image reconstructions and MIPs were obtained to evaluate the vascular anatomy. Carotid stenosis measurements (when applicable) are obtained utilizing NASCET criteria, using the distal internal carotid diameter as the denominator. Multiphase CT imaging of the brain was  performed following IV bolus contrast injection. Subsequent parametric perfusion maps were calculated using RAPID software. CONTRAST:  40mL OMNIPAQUE IOHEXOL 350 MG/ML SOLN, 60mL OMNIPAQUE IOHEXOL 350 MG/ML SOLN COMPARISON:  None. FINDINGS: CTA NECK FINDINGS Aortic arch: Great vessel origins are patent with calcified plaque present. Right carotid system: Patent. Mixed but primarily calcified plaque at the ICA origin causing approximately 50% stenosis. Left carotid system: Patent. Mixed but primarily calcified plaque at the ICA origin causing less than 50% stenosis. Vertebral arteries: Patent. Right vertebral artery is dominant. There is calcified plaque at the left vertebral origin with potential significant stenosis. Skeleton: Degenerative changes of the cervical spine. Other neck: No mass or adenopathy. Upper chest: No apical lung mass Review of the MIP images confirms the above findings CTA HEAD FINDINGS Anterior circulation:  Intracranial internal carotid arteries are patent with mild calcified plaque. Anterior and middle cerebral arteries are patent. Right A1 ACA is dominant. Mild stenosis of the left M1 MCA. Posterior circulation: Intracranial vertebral arteries are patent with mild atherosclerotic irregularity. Patent PICA origins. Basilar artery is patent. Posterior cerebral arteries are patent. Large bilateral posterior communicating arteries. Venous sinuses: As permitted by contrast timing, patent. Review of the MIP images confirms the above findings CT Brain Perfusion Findings: CBF (<30%) Volume: 0mL Perfusion (Tmax>6.0s) volume: 0mL Mismatch Volume: 0mL Infarction Location:None. IMPRESSION: No large vessel occlusion. Perfusion imaging demonstrates no evidence of core infarction or territory at risk. Primarily calcified plaque at the ICA origins causing approximately 50% stenosis on the right and less than 50% stenosis on the left. Mild stenosis of the intracranial carotids and left M1 MCA. Electronically  Signed   By: Guadlupe SpanishPraneil  Patel M.D.   On: 07/24/2020 12:49   CT CEREBRAL PERFUSION W CONTRAST  Result Date: 07/24/2020 CLINICAL DATA:  Left-sided weakness EXAM: CT ANGIOGRAPHY HEAD AND NECK CT PERFUSION BRAIN TECHNIQUE: Multidetector CT imaging of the head and neck was performed using the standard protocol during bolus administration of intravenous contrast. Multiplanar CT image reconstructions and MIPs were obtained to evaluate the vascular anatomy. Carotid stenosis measurements (when applicable) are obtained utilizing NASCET criteria, using the distal internal carotid diameter as the denominator. Multiphase CT imaging of the brain was performed following IV bolus contrast injection. Subsequent parametric perfusion maps were calculated using RAPID software. CONTRAST:  40mL OMNIPAQUE IOHEXOL 350 MG/ML SOLN, 60mL OMNIPAQUE IOHEXOL 350 MG/ML SOLN COMPARISON:  None. FINDINGS: CTA NECK FINDINGS Aortic arch: Great vessel origins are patent with calcified plaque present. Right carotid system: Patent. Mixed but primarily calcified plaque at the ICA origin causing approximately 50% stenosis. Left carotid system: Patent. Mixed but primarily calcified plaque at the ICA origin causing less than 50% stenosis. Vertebral arteries: Patent. Right vertebral artery is dominant. There is calcified plaque at the left vertebral origin with potential significant stenosis. Skeleton: Degenerative changes of the cervical spine. Other neck: No mass or adenopathy. Upper chest: No apical lung mass Review of the MIP images confirms the above findings CTA HEAD FINDINGS Anterior circulation: Intracranial internal carotid arteries are patent with mild calcified plaque. Anterior and middle cerebral arteries are patent. Right A1 ACA is dominant. Mild stenosis of the left M1 MCA. Posterior circulation: Intracranial vertebral arteries are patent with mild atherosclerotic irregularity. Patent PICA origins. Basilar artery is patent. Posterior cerebral  arteries are patent. Large bilateral posterior communicating arteries. Venous sinuses: As permitted by contrast timing, patent. Review of the MIP images confirms the above findings CT Brain Perfusion Findings: CBF (<30%) Volume: 0mL Perfusion (Tmax>6.0s) volume: 0mL Mismatch Volume: 0mL Infarction Location:None. IMPRESSION: No large vessel occlusion. Perfusion imaging demonstrates no evidence of core infarction or territory at risk. Primarily calcified plaque at the ICA origins causing approximately 50% stenosis on the right and less than 50% stenosis on the left. Mild stenosis of the intracranial carotids and left M1 MCA. Electronically Signed   By: Guadlupe SpanishPraneil  Patel M.D.   On: 07/24/2020 12:49   CT HEAD CODE STROKE WO CONTRAST  Result Date: 07/24/2020 CLINICAL DATA:  Code stroke.  Left-sided weakness EXAM: CT HEAD WITHOUT CONTRAST TECHNIQUE: Contiguous axial images were obtained from the base of the skull through the vertex without intravenous contrast. COMPARISON:  None. FINDINGS: Brain: There is no acute intracranial hemorrhage, mass effect, or edema. There is no acute appearing loss of gray-white differentiation. Prominence of  the ventricles and sulci reflects generalized parenchymal volume loss. There is ex vacuo dilatation of the frontal horn of the right lateral ventricle contiguous with an infarct. Patchy and confluent areas of hypoattenuation in the supratentorial white matter are nonspecific but probably reflect moderate chronic microvascular ischemic changes. There are age-indeterminate small vessel infarcts of the right cerebellum. Vascular: Mild focal hyperdensity along a proximal right M2 MCA branch. There is intracranial atherosclerotic calcification at the skull base. Skull: Unremarkable. Sinuses/Orbits: No acute finding. Other: Mastoid air cells are clear. ASPECTS ([place] Stroke Program Early CT Score) - Ganglionic level infarction (caudate, lentiform nuclei, internal capsule, insula, M1-M3  cortex): 7 - Supraganglionic infarction (M4-M6 cortex): 3 Total score (0-10 with 10 being normal): 10 IMPRESSION: No acute intracranial hemorrhage or evidence of acute infarction. ASPECT score is 10. Age-indeterminate small vessel infarcts of the right cerebellum. Focus of mild hyperdensity along a proximal right M2 MCA branch may be artifactual. Correlation will be made with forthcoming CT vascular imaging. These results were called by telephone at the time of interpretation on 07/24/2020 at 12:22 pm to provider [facility] , who verbally acknowledged these results. Electronically Signed   By: Guadlupe Spanish M.D.   On: 07/24/2020 12:27    EKG: Independently reviewed. Sinus, no STT changes    Time spent:60 minutes Code Status:   FULL Family Communication:  Daughter updated 8/25 Disposition Plan: expect 1-2 day hospitalization Consults called: neurology DVT Prophylaxis: [place] Lovenox  Catarina Hartshorn, DO  [facility] Pager [phone]  If 7PM-7AM, please contact night-coverage [email or web address] Password [email or web address] 07/24/2020, 1:33 PM

## 2020-07-24 NOTE — Consult Note (Signed)
[facility] Jayron Maqueda A. Gerilyn Pilgrim, MD     [email or web address]          Catherine Anthony is an 86 y.o. female.   ASSESSMENT/PLAN: 1.  SMALL EMBOLIC STROKES LIKELY DUE TO ATRIAL FIBRILLATION. The side of infarcts are small and therefore think it is safe to restart the patient on anticoagulation. Aspirin 81 mg is recommended for 1 week in addition to the anticoagulation. Patient will need physical and occupational therapies.    Patient 86 year old right-handed white female who usually is functional at baseline. She tells me that she can do for herself.  She stays with her daughter. She woke up and noted that she was quite weak on the left side. She decided to seek medical attention. She does have paroxysmal atrial fibrillation and anticoagulation. The patient does not report dysarthria, dysphagia, chest pain or, palpitation, headaches or shortness of breath. The review systems otherwise negative.   GENERAL:  This is a very pleasant thin female who is in some discomfort but no acute distress.  HEENT:  Neck is supple no trauma noted.  ABDOMEN: soft  EXTREMITIES: No edema   BACK:  Normal  SKIN: Normal by inspection.    MENTAL STATUS: Alert and oriented. Speech -  Seems mildly dysarthric; language and cognition are generally intact. Judgment and insight normal.   CRANIAL NERVES: Pupils are equal, round and reactive to light and accomodation; extra ocular movements are full, there is no significant nystagmus; visual fields are full; upper and lower facial muscles are normal in strength and symmetric, there is no flattening of the nasolabial folds; tongue is midline; uvula is midline; shoulder elevation is normal.  MOTOR:  Right upper extremity is 4+/5. There is mild drift of the right upper extremity. Right leg is also 4/5 with mild drift. Left leg strength is 2/5. Left upper extremity 0/5.  COORDINATION: Left finger to nose is normal, right finger to nose is normal, No rest tremor;  no intention tremor; no postural tremor; no bradykinesia.  REFLEXES: Deep tendon reflexes are symmetrical and normal. Plantar reflexes are flexor bilaterally.   SENSATION: Normal to light touch, temperature, and pain.  She is taking issues of double simultaneous stimulation involving the left upper extremity and left leg.   NIH stroke scale 1, 3, 3, 2 total 9.  Blood pressure (!) 188/106, pulse 80, temperature 98.6 F (37 C), temperature source Oral, resp. rate 18, weight 41.9 kg, SpO2 98 %.  Past Medical History:  Diagnosis Date  . Arthritis    Right hand  . Complication of anesthesia   . Coronary artery disease    s/p mutliple PCIs  . Family hx of colon cancer   . High cholesterol   . History of kidney stones   . Hypertension   . PAF (paroxysmal atrial fibrillation) (HCC)    on Eliquis  . S/P TAVR (transcatheter aortic valve replacement)   . Severe aortic stenosis     Past Surgical History:  Procedure Laterality Date  . CATARACT EXTRACTION W/ INTRAOCULAR LENS IMPLANT Right 06/04/2011   [facility]  . CATARACT EXTRACTION W/PHACO  06/22/2011   Procedure: CATARACT EXTRACTION PHACO AND INTRAOCULAR LENS PLACEMENT (IOC);  Surgeon: Gemma Payor;  Location: [facility];  Service: Ophthalmology;  Laterality: Left;  CDE 14.34  . COLONOSCOPY  06/23/08  . COLONOSCOPY N/A 01/18/2014   Procedure: COLONOSCOPY;  Surgeon: Malissa Hippo, MD;  Location: [facility];  Service: Endoscopy;  Laterality: N/A;  145  . CORONARY ANGIOPLASTY WITH STENT  PLACEMENT    . CORONARY ATHERECTOMY N/A 09/21/2017   Procedure: CORONARY ATHERECTOMY;  Surgeon: Elder Negus, MD;  Location: [facility] INVASIVE CV LAB;  Service: Cardiovascular;  Laterality: N/A;  . CORONARY STENT INTERVENTION N/A 08/31/2017   Procedure: CORONARY STENT INTERVENTION;  Surgeon: Elder Negus, MD;  Location: [facility];  Service: Cardiovascular;  Laterality: N/A;  . CORONARY STENT INTERVENTION N/A 09/21/2017   Procedure: CORONARY STENT  INTERVENTION;  Surgeon: Elder Negus, MD;  Location: [facility];  Service: Cardiovascular;  Laterality: N/A;  . EYE SURGERY Bilateral    "after cataract OR"  . INTRAVASCULAR ULTRASOUND/IVUS N/A 09/21/2017   Procedure: Intravascular Ultrasound/IVUS;  Surgeon: Elder Negus, MD;  [facility]: [facility];  Service: Cardiovascular;  Laterality: N/A;  . RIGHT/LEFT HEART CATH AND CORONARY ANGIOGRAPHY N/A 08/31/2017   Procedure: RIGHT/LEFT HEART CATH AND CORONARY ANGIOGRAPHY;  Surgeon: Elder Negus, MD;  [facility];  Service: Cardiovascular;  Laterality: N/A;  . RIGHT/LEFT HEART CATH AND CORONARY ANGIOGRAPHY N/A 05/16/2019   Procedure: RIGHT/LEFT HEART CATH AND CORONARY ANGIOGRAPHY;  Surgeon: Elder Negus, MD;  [facility];  Service: Cardiovascular;  Laterality: N/A;  . TEE WITHOUT CARDIOVERSION N/A 06/06/2019   Procedure: TRANSESOPHAGEAL ECHOCARDIOGRAM (TEE);  Surgeon: Tonny Bollman, MD;  [facility] INV[facility] CV [facility];  Service: Open Heart Surgery;  Laterality: N/A;  . TRANSCATHETER AORTIC VALVE REPLACEMENT, TRANSFEMORAL N/A 06/06/2019   Procedure: TRANSCATHETER AORTIC VALVE REPLACEMENT, TRANSFEMORAL;  Surgeon: Tonny Bollman, MD;  Location: [facility] INVASIVE CV LAB;  Service: Open Heart Surgery;  Laterality: N/A;  . ULTRASOUND GUIDANCE FOR VASCULAR ACCESS  09/21/2017   Procedure: Ultrasound Guidance For Vascular Access;  Surgeon: Elder Negus, MD;  [facility];  Service: Cardiovascular;;  . VARICOSE VEIN SURGERY Bilateral 1960s   [facility]    Family History  Problem Relation Age of Onset  . Hypertension Mother     Social History:  reports that she has never smoked. She has never used smokeless tobacco. She reports that she does not drink alcohol and does not use drugs.  Allergies:  Allergies  Allergen Reactions  . Codeine Nausea And Vomiting    Medications: Prior to Admission medications     Medication Sig Start Date End Date Taking? Authorizing Provider  amiodarone (PACERONE) 200 MG tablet Take 200 mg by mouth as directed. Monday and Friday   Yes [provider]  ELIQUIS 2.5 MG TABS tablet TAKE (1) TABLET BY MOUTH TWICE DAILY. 06/24/20  Yes Patwardhan, Manish J, MD  furosemide (LASIX) 20 MG tablet Take 1 tablet (20 mg total) by mouth daily. 06/10/20 09/08/20 Yes Patwardhan, Manish J, MD  hydrALAZINE (APRESOLINE) 25 MG tablet Take 1 tablet (25 mg total) by mouth 3 (three) times daily. 05/15/20 08/13/20 Yes Patwardhan, Manish J, MD  lisinopril (PRINIVIL,ZESTRIL) 40 MG tablet Take 40 mg by mouth daily.    Yes [provider]  metoprolol tartrate (LOPRESSOR) 25 MG tablet TAKE (1) TABLET BY MOUTH TWICE DAILY. 07/22/20  Yes Patwardhan, Manish J, MD  mirtazapine (REMERON) 15 MG tablet Take 7.5-15 mg by mouth at bedtime. 05/15/20  Yes [provider]  nitroGLYCERIN (NITROSTAT) 0.4 MG SL tablet Place 0.4 mg under the tongue every 5 (five) minutes x 3 doses as needed for chest pain.    Yes [provider]  rosuvastatin (CRESTOR) 20 MG tablet TAKE (1) TABLET BY MOUTH ONCE DAILY. 06/24/20  Yes Patwardhan, Anabel Bene, MD  VITAMIN D, CHOLECALCIFEROL, PO Take 1 tablet by mouth daily.   Yes [provider]    Scheduled Meds: .  stroke: mapping our early stages of recovery book   Does not apply Once  . [START ON 07/26/2020] amiodarone  200 mg Oral Once per day on Mon Fri  . aspirin EC  81 mg Oral Daily  . enoxaparin (LOVENOX) injection  40 mg Subcutaneous Q24H  . metoprolol tartrate  12.5 mg Oral BID  . mirtazapine  7.5-15 mg Oral QHS  . rosuvastatin  20 mg Oral Daily   Continuous Infusions: . sodium chloride 10 mL/hr at 07/24/20 1819   PRN Meds:.acetaminophen **OR** acetaminophen (TYLENOL) oral liquid 160 mg/5 mL **OR** acetaminophen, hydrALAZINE, senna-docusate     Results for orders placed or performed during the hospital encounter of 07/24/20 (from  the past 48 hour(s))  CBG monitoring, ED     Status: None   Collection Time: 07/24/20 12:05 PM  Result Value Ref Range   Glucose-Capillary 82 70 - 99 mg/dL    Comment: Glucose reference range applies only to samples taken after fasting for at least 8 hours.  Ethanol     Status: None   Collection Time: 07/24/20 12:05 PM  Result Value Ref Range   Alcohol, Ethyl (B) <10 <10 mg/dL    Comment: (NOTE) Lowest detectable limit for serum alcohol is 10 mg/dL.  For medical purposes only. Performed at [facility], [address] [place], [place] [address]   Protime-INR     Status: None   Collection Time: 07/24/20 12:05 PM  Result Value Ref Range   Prothrombin Time 14.7 11.4 - 15.2 seconds   INR 1.2 0.8 - 1.2    Comment: (NOTE) INR goal varies based on device and disease states. Performed at [facility], [address] [place], [place] [address]   APTT     Status: None   Collection Time: 07/24/20 12:05 PM  Result Value Ref Range   aPTT 35 24 - 36 seconds    Comment: Performed at [facility], [address], [place], [place] [address]  CBC     Status: Abnormal   Collection Time: 07/24/20 12:05 PM  Result Value Ref Range   WBC 6.5 4.0 - 10.5 K/uL   RBC 3.56 (L) 3.87 - 5.11 MIL/uL   Hemoglobin 11.9 (L) 12.0 - 15.0 g/dL   HCT 86.5 (L) 36 - 46 %   MCV 99.2 80.0 - 100.0 fL   MCH 33.4 26.0 - 34.0 pg   MCHC 33.7 30.0 - 36.0 g/dL   RDW 78.4 69.6 - 29.5 %   Platelets 167 150 - 400 K/uL   nRBC 0.0 0.0 - 0.2 %    Comment: Performed at [facility], [address] [place], [place] [address]  Differential     Status: None   Collection Time: 07/24/20 12:05 PM  Result Value Ref Range   Neutrophils Relative % 54 %   Neutro Abs 3.5 1.7 - 7.7 K/uL   Lymphocytes Relative 36 %   Lymphs Abs 2.3 0.7 - 4.0 K/uL   Monocytes Relative 8 %   Monocytes Absolute 0.5 0 - 1 K/uL   Eosinophils Relative 2 %   Eosinophils Absolute 0.1 0 - 0 K/uL   Basophils Relative 0 %   Basophils Absolute 0.0 0 - 0 K/uL     Immature Granulocytes 0 %   Abs Immature Granulocytes 0.01 0.00 - 0.07 K/uL    Comment: Performed at [facility], [address] [place], [place] [address]  Comprehensive metabolic panel     Status: Abnormal   Collection Time: 07/24/20 12:05 PM  Result Value Ref Range   Sodium 136 135 - 145 mmol/L   Potassium 2.4 (LL) 3.5 - 5.1 mmol/L    Comment: CRITICAL RESULT CALLED TO, READ BACK BY AND VERIFIED WITH: CARDWELL,L AT 12:30PM ON 07/24/20 BY FESTERMAN,C    Chloride 98 98 - 111 mmol/L   CO2 27 22 - 32 mmol/L   Glucose, Bld 103 (H) 70 - 99 mg/dL    Comment: Glucose reference range applies only to samples taken after fasting for at least 8 hours.   BUN 18 8 - 23 mg/dL   Creatinine, Ser 8.25 (H) 0.44 - 1.00 mg/dL   Calcium 8.9 8.9 - 00.3 mg/dL   Total Protein 6.4 (L) 6.5 - 8.1 g/dL   Albumin 3.6 3.5 - 5.0 g/dL   AST 23 15 - 41 U/L   ALT 19 0 - 44 U/L   Alkaline Phosphatase 43 38 - 126 U/L   Total Bilirubin 1.0 0.3 - 1.2 mg/dL   GFR calc non Af Amer 38 (L) >60 mL/min   GFR calc Af Amer 44 (L) >60 mL/min   Anion gap 11 5 - 15    Comment: Performed at [facility], [address] [place], [place] [address]  Magnesium     Status: None   Collection Time: 07/24/20 12:05 PM  Result Value Ref Range   Magnesium 2.3 1.7 - 2.4 mg/dL    Comment: Performed at [facility], [address], [place], [place] [address]  I-stat chem 8, ED     Status: Abnormal   Collection Time: 07/24/20 12:10 PM  Result Value Ref Range   Sodium 139 135 - 145 mmol/L   Potassium 2.4 (LL) 3.5 - 5.1 mmol/L   Chloride 97 (L) 98 - 111 mmol/L   BUN 17 8 - 23 mg/dL   Creatinine, Ser 4.50 (H) 0.44 - 1.00 mg/dL   Glucose, Bld 388 (H) 70 - 99 mg/dL    Comment: Glucose reference range applies only to samples taken after fasting for at least 8 hours.   Calcium, Ion 1.16 1.15 - 1.40 mmol/L   TCO2 28 22 - 32 mmol/L   Hemoglobin 11.9 (L) 12.0 - 15.0 g/dL   HCT 82.8 (L) 36 - 46 %  SARS Coronavirus 2 by RT PCR (hospital  order, performed in [facility] hospital lab) Nasopharyngeal Nasopharyngeal Swab     Status: None   Collection Time: 07/24/20 12:20 PM   Specimen: Nasopharyngeal Swab  Result Value Ref Range   SARS Coronavirus 2 NEGATIVE NEGATIVE    Comment: (NOTE) SARS-CoV-2 target nucleic acids are NOT DETECTED.  The SARS-CoV-2 RNA is generally detectable in upper and lower respiratory specimens during the acute phase of infection. The lowest concentration of SARS-CoV-2 viral copies this assay can detect is 250 copies / mL. A negative result does not preclude SARS-CoV-2 infection and should not be used as the sole basis for treatment or other patient management decisions.  A negative result may occur with improper specimen collection / handling, submission of specimen other than nasopharyngeal swab, presence of viral mutation(s) within the areas targeted by this assay, and inadequate number of viral copies (<250 copies / mL). A negative result must be combined with clinical observations, patient history, and epidemiological information.  Fact Sheet for Patients:   BoilerBrush.com.cy  Fact Sheet for Healthcare Providers: https://pope.com/  This  test is not yet approved or  cleared by the [place] FDA and has been authorized for detection and/or diagnosis of SARS-CoV-2 by FDA under an Emergency Use Authorization (EUA).  This EUA will remain in effect (meaning this test can be used) for the duration of the COVID-19 declaration under Section 564(b)(1) of the Act, 21 U.S.C. section 360bbb-3(b)(1), unless the authorization is terminated or revoked sooner.  Performed at [facility], [address] [place], [place] [id number]     Studies/Results:  BRAIN MRI FINDINGS: MRI HEAD FINDINGS  Brain: Diffusion imaging shows acute infarction on the right affecting the external capsule and corona radiata region. No hemorrhage or mass effect. 2 acute  punctate infarctions in the left frontal cortex region.  Old small vessel cerebellar infarctions. Old small vessel infarction right pons. Wallerian degeneration on the right. Old infarction of the right caudate head and adjacent white matter tracks per moderate chronic small-vessel ischemic changes of the white matter elsewhere. No cortical or large vessel territory infarction. No sign of hemorrhage, hydrocephalus or extra-axial collection.  Vascular: Major vessels at the base of the brain show flow.  Skull and upper cervical spine: Negative  Sinuses/Orbits: Clear/normal  Other: Small left mastoid effusion.  MRA HEAD FINDINGS  Some motion degradation. Both internal carotid arteries are patent through the skull base and siphon regions. Both anterior and middle cerebral arteries are patent. Distal vessels not accurately evaluated because of motion.  Right vertebral artery is widely patent to the basilar. Question abnormal slow or retrograde flow in the left vertebral artery. No basilar stenosis. Flow is present in the superior cerebellar and posterior cerebral arteries. Primary anterior circulation supply of the posterior cerebral arteries. Again, distal vessel detail is compromised by motion.  IMPRESSION: Acute infarction affecting the right external capsule and corona radiata. No hemorrhage or mass effect.  Two punctate acute infarctions in the left frontal cortex.  Chronic small-vessel ischemic change elsewhere. Old infarction right caudate head and adjacent white matter.  Motion degraded intracranial MR angiography does not show any large or medium vessel occlusion. Apparent slow or retrograde flow in the distal left vertebral artery, not confidently established because of Motion.   The brain MRI is reviewed in person. There is acute infarct involving the right external capsule and extending to the basal ganglia and corona radiata. There is also a small  cortical infarct also seen on DWI involving the left frontal cortex. There is marked global atrophy. There is increased signal seen on FLAIR imaging in the periventricular region and also involving the pontine region indicating moderate periventricular and deep white matter chronic microvascular changes. There is encephalomalacia involving the head of the caudate nuclei on the left and left pontine region indicating remote infarcts. No hemorrhage is noted.    CTA HEAD NECK FINDINGS: CTA NECK FINDINGS  Aortic arch: Great vessel origins are patent with calcified plaque present.  Right carotid system: Patent. Mixed but primarily calcified plaque at the ICA origin causing approximately 50% stenosis.  Left carotid system: Patent. Mixed but primarily calcified plaque at the ICA origin causing less than 50% stenosis.  Vertebral arteries: Patent. Right vertebral artery is dominant. There is calcified plaque at the left vertebral origin with potential significant stenosis.  Skeleton: Degenerative changes of the cervical spine.  Other neck: No mass or adenopathy.  Upper chest: No apical lung mass  Review of the MIP images confirms the above findings  CTA HEAD FINDINGS  Anterior circulation: Intracranial internal carotid arteries are patent  with mild calcified plaque. Anterior and middle cerebral arteries are patent. Right A1 ACA is dominant. Mild stenosis of the left M1 MCA.  Posterior circulation: Intracranial vertebral arteries are patent with mild atherosclerotic irregularity. Patent PICA origins. Basilar artery is patent. Posterior cerebral arteries are patent. Large bilateral posterior communicating arteries.  Venous sinuses: As permitted by contrast timing, patent.  Review of the MIP images confirms the above findings  CT Brain Perfusion Findings:  CBF (<30%) Volume: 0mL  Perfusion (Tmax>6.0s) volume: 0mL  Mismatch Volume: 0mL  Infarction  Location:None.  IMPRESSION: No large vessel occlusion. Perfusion imaging demonstrates no evidence of core infarction or territory at risk.  Primarily calcified plaque at the ICA origins causing approximately 50% stenosis on the right and less than 50% stenosis on the left.  Mild stenosis of the intracranial carotids and left M1 MCA.           Steffani Dionisio A. Gerilyn Pilgrim, M.D.  Diplomate, Biomedical engineer of Psychiatry and Neurology ( Neurology). 07/24/2020, 6:34 PM

## 2020-07-24 NOTE — Progress Notes (Signed)
NURSING PROGRESS NOTE  Catherine Anthony [id number] Admission Data: 07/24/2020 6:17 PM Attending Provider: Catarina Hartshorn, MD WUJ:[id number], Catherine Beckers, MD Code Status: Full Code  Catherine Anthony is a 86 y.o. female patient admitted from ED:  -No acute distress noted.  -No complaints of shortness of breath.  -No complaints of chest pain.   Cardiac Monitoring: Box # 13 in place. Cardiac monitor yields: Normal SR with PVC's  Blood pressure (!) 188/106, pulse 80, temperature 98.6 F (37 C), temperature source Oral, resp. rate 18, weight 41.9 kg, SpO2 98 %.   IV Fluids:  IV in place, occlusive dsg intact without redness, IV cath forearm right, condition  Allergies:  Codeine  Past Medical History:   has a past medical history of Arthritis, Complication of anesthesia, Coronary artery disease, Family hx of colon cancer, High cholesterol, History of kidney stones, Hypertension, PAF (paroxysmal atrial fibrillation) (HCC), S/P TAVR (transcatheter aortic valve replacement), and Severe aortic stenosis.  Past Surgical History:   has a past surgical history that includes Varicose vein surgery (Bilateral, 1960s); Colonoscopy (06/23/08); Cataract extraction w/ intraocular lens implant (Right, 06/04/2011); Cataract extraction w/PHACO (06/22/2011); Colonoscopy (N/A, 01/18/2014); RIGHT/LEFT HEART CATH AND CORONARY ANGIOGRAPHY (N/A, 08/31/2017); CORONARY STENT INTERVENTION (N/A, 08/31/2017); CORONARY STENT INTERVENTION (N/A, 09/21/2017); CORONARY ATHERECTOMY (N/A, 09/21/2017); Ultrasound guidance for vascular access (09/21/2017); Intravascular Ultrasound/IVUS (N/A, 09/21/2017); Eye surgery (Bilateral); Coronary angioplasty with stent; RIGHT/LEFT HEART CATH AND CORONARY ANGIOGRAPHY (N/A, 05/16/2019); Transcatheter aortic valve replacement, transfemoral (N/A, 06/06/2019); and TEE without cardioversion (N/A, 06/06/2019).  Social History:   reports that she has never smoked. She has never used smokeless tobacco. She reports that she  does not drink alcohol and does not use drugs.  Skin: Intact  Patient/Family orientated to room. Information packet given to patient/family. Admission inpatient armband information verified with patient/family to include name and date of birth and placed on patient arm. Side rails up x 2, fall assessment and education completed with patient/family. Patient/family able to verbalize understanding of risk associated with falls and verbalized understanding to call for assistance before getting out of bed. Call light within reach. Patient/family able to voice and demonstrate understanding of unit orientation instructions.    Will continue to evaluate and treat per MD orders.     Roma Kayser, RN

## 2020-07-24 NOTE — ED Triage Notes (Signed)
EMS reports woke 0800 and was normal.  At 1030 started having weakness on left side and drooping to left side of face.  Pt alert, oriented.  Speech slurred.  Reports is on eloquis.

## 2020-07-24 NOTE — Telephone Encounter (Signed)
Patient daughter called and states that patient has been admitted to the hospital the morning of 07/24/2020 because she had a stroke .They would like to let you know . Thanks Asiana .

## 2020-07-24 NOTE — Consult Note (Addendum)
[facility] Tele[facility] Consult Services   Date of Service:   07/24/2020 12:10:02  Impression:     .  I63.9 - Cerebrovascular accident (CVA), unspecified mechanism (HCC)  Comments/Sign-Out: 86 year old female with multiple vascular risk factors include hypertension, hyperlipidemia, paroxysmal atrial fibrillation on Eliquis, TAVR, presents the emerge department with the abrupt onset of left hemiparesis concerning for right hemispheric stroke syndrome. No TPA due to Eliquis use within the past 48 hours. CT angiogram head and neck as well as CT perfusion are currently pending. If there is no large vessel occlusion, recommend admission for stroke work-up and evaluation.  Update: no LVO on CTA/P.  Metrics: Last Known Well: 07/24/2020 10:00:00 [facility] Notification Time: 07/24/2020 12:10:01 Arrival Time: 07/24/2020 11:58:00 Stamp Time: 07/24/2020 12:10:02 Time First Login Attempt: 07/24/2020 12:12:57 Symptoms: Left hemiparesis. NIHSS Start Assessment Time: 07/24/2020 12:29:47 Patient is not a candidate for Thrombolytic. Thrombolytic Medical Decision: 07/24/2020 12:20:02 Patient was not deemed candidate for Thrombolytic because of following reasons: Use of NOAs within 48 hours.  CT head showed no acute hemorrhage or acute core infarct. CT head was reviewed and results were: concern for ?left m2 branch hyperdense sign  ED Physician notified of diagnostic impression and management plan on 07/24/2020 12:42:37  Advanced Imaging: CTA Head and Neck ordered  CTP ordered   Our recommendations are outlined below.  Recommendations:     .  Activate Stroke Protocol Admission/Order Set     .  Stroke/Telemetry Floor     .  Neuro Checks     .  Bedside Swallow Eval     .  DVT Prophylaxis     .  IV Fluids, Normal Saline     .  Head of Bed 30 Degrees     .  Euglycemia and Avoid Hyperthermia (PRN Acetaminophen)     .  MRI brain without IV contrast     .  MRA  head/neck     .  TSH, A1c, lipid profile     .  Transthoracic echocardiogram     .  Continuous telemetry     .  Physical, occupational, and speech therapies     .  q4h neuro checks/NIHSS     .  NPO until bedside swallow     .  Hold Eliquis until MRI is performed to evaluate size of potential infarct, to reduce risk of hemorrhagic transformation     .  Neurology follow-up  Routine Consultation with Inhouse Neurology for Follow up Care  Sign Out:     .  Discussed with Emergency Department Provider    ------------------------------------------------------------------------------  History of Present Illness: Patient is a 86 year old Female.  Patient was brought by EMS for symptoms of Left hemiparesis.  70F h/o afib on eliquis, HLD, HTN, CAD, valve replacement, presents to ED with left hemiparesis. Woke up well. At 10:30 am, began having left sided weakness. She is on Eliquis 2.5 mg twice daily, and last took a dose last night. On exam, her NIH stroke scale is a 7 for a right hemispheric stroke syndrome. CT head shows no acute stroke although she does have an apparent chronic infarct in the right periventricular white matter. CT angiogram head and neck are pending as well as CT perfusion.   Past Medical History:     . Hypertension     . Hyperlipidemia     . Atrial Fibrillation     . Coronary Artery Disease     . TAVR  Examination: Pulse(72), Blood Glucose(82) 1A: Level of Consciousness - Alert; keenly responsive + 0 1B: Ask Month and Age - Both Questions Right + 0 1C: Blink Eyes & Squeeze Hands - Performs Both Tasks + 0 2: Test Horizontal Extraocular Movements - Partial Gaze Palsy: Can Be Overcome + 1 3: Test Visual Fields - No Visual Loss + 0 4: Test Facial Palsy (Use Grimace if Obtunded) - Partial paralysis (lower face) + 2 5A: Test Left Arm Motor Drift - No Drift for 10 Seconds + 0 5B: Test Right Arm Motor Drift - No Drift for 10 Seconds + 0 6A: Test Left Leg Motor  Drift - Some Effort Against Gravity + 2 6B: Test Right Leg Motor Drift - No Drift for 5 Seconds + 0 7: Test Limb Ataxia (FNF/Heel-Shin) - No Ataxia + 0 8: Test Sensation - Mild-Moderate Loss: Can Sense Being Touched + 1 9: Test Language/Aphasia - Normal; No aphasia + 0 10: Test Dysarthria - Normal + 0 11: Test Extinction/Inattention - Extinction to bilateral simultaneous stimulation + 1  NIHSS Score: 7  Pre-Morbid Modified Rankin Scale: 0 Points = No symptoms at all   Patient/Family was informed the Neurology Consult would occur via TeleHealth consult by way of interactive audio and video telecommunications and consented to receiving care in this manner.   Patient is being evaluated for possible acute neurologic impairment and high probability of imminent or life-threatening deterioration. I spent total of 35 minutes providing care to this patient, including time for face to face visit via telemedicine, review of medical records, imaging studies and discussion of findings with providers, the patient and/or family.   Dr Lacie Scotts   [facility] [phone]  Case [id number]

## 2020-07-24 NOTE — ED Notes (Signed)
CRITICAL VALUE ALERT  Critical Value:  P;otassium 2.4  Date & Time Notied:  07/24/2020, 1232  Provider Notified: Dr. Jodi Mourning  Orders Received/Actions taken: see chart

## 2020-07-24 NOTE — Progress Notes (Signed)
1146 CALL TIME 1154 BEEPER TIME 1208 EXAM STARTED 1210 EXAM FINISHED 1210 IMAGES SENT TO SOC 1211 EXAM COMPLETED IN Epic 1212 [facility] CALLED

## 2020-07-25 ENCOUNTER — Ambulatory Visit: Payer: Medicare Other | Admitting: Cardiology

## 2020-07-25 ENCOUNTER — Inpatient Hospital Stay (HOSPITAL_COMMUNITY): Payer: Medicare Other

## 2020-07-25 DIAGNOSIS — I129 Hypertensive chronic kidney disease with stage 1 through stage 4 chronic kidney disease, or unspecified chronic kidney disease: Secondary | ICD-10-CM | POA: Diagnosis not present

## 2020-07-25 DIAGNOSIS — R69 Illness, unspecified: Secondary | ICD-10-CM | POA: Diagnosis not present

## 2020-07-25 DIAGNOSIS — R531 Weakness: Secondary | ICD-10-CM | POA: Diagnosis not present

## 2020-07-25 DIAGNOSIS — Z9842 Cataract extraction status, left eye: Secondary | ICD-10-CM | POA: Diagnosis not present

## 2020-07-25 DIAGNOSIS — I10 Essential (primary) hypertension: Secondary | ICD-10-CM | POA: Diagnosis not present

## 2020-07-25 DIAGNOSIS — Z885 Allergy status to narcotic agent status: Secondary | ICD-10-CM | POA: Diagnosis not present

## 2020-07-25 DIAGNOSIS — I639 Cerebral infarction, unspecified: Secondary | ICD-10-CM | POA: Diagnosis not present

## 2020-07-25 DIAGNOSIS — Z9841 Cataract extraction status, right eye: Secondary | ICD-10-CM | POA: Diagnosis not present

## 2020-07-25 DIAGNOSIS — I5030 Unspecified diastolic (congestive) heart failure: Secondary | ICD-10-CM | POA: Diagnosis present

## 2020-07-25 DIAGNOSIS — N1832 Chronic kidney disease, stage 3b: Secondary | ICD-10-CM | POA: Diagnosis present

## 2020-07-25 DIAGNOSIS — Z961 Presence of intraocular lens: Secondary | ICD-10-CM | POA: Diagnosis present

## 2020-07-25 DIAGNOSIS — I6340 Cerebral infarction due to embolism of unspecified cerebral artery: Secondary | ICD-10-CM | POA: Diagnosis present

## 2020-07-25 DIAGNOSIS — Z952 Presence of prosthetic heart valve: Secondary | ICD-10-CM | POA: Diagnosis not present

## 2020-07-25 DIAGNOSIS — Z79899 Other long term (current) drug therapy: Secondary | ICD-10-CM | POA: Diagnosis not present

## 2020-07-25 DIAGNOSIS — I6389 Other cerebral infarction: Secondary | ICD-10-CM

## 2020-07-25 DIAGNOSIS — Z7401 Bed confinement status: Secondary | ICD-10-CM | POA: Diagnosis not present

## 2020-07-25 DIAGNOSIS — I350 Nonrheumatic aortic (valve) stenosis: Secondary | ICD-10-CM | POA: Diagnosis present

## 2020-07-25 DIAGNOSIS — I69354 Hemiplegia and hemiparesis following cerebral infarction affecting left non-dominant side: Secondary | ICD-10-CM | POA: Diagnosis not present

## 2020-07-25 DIAGNOSIS — I69391 Dysphagia following cerebral infarction: Secondary | ICD-10-CM | POA: Diagnosis not present

## 2020-07-25 DIAGNOSIS — Z87442 Personal history of urinary calculi: Secondary | ICD-10-CM | POA: Diagnosis not present

## 2020-07-25 DIAGNOSIS — Z20822 Contact with and (suspected) exposure to covid-19: Secondary | ICD-10-CM | POA: Diagnosis present

## 2020-07-25 DIAGNOSIS — I480 Paroxysmal atrial fibrillation: Secondary | ICD-10-CM | POA: Diagnosis present

## 2020-07-25 DIAGNOSIS — Z955 Presence of coronary angioplasty implant and graft: Secondary | ICD-10-CM | POA: Diagnosis not present

## 2020-07-25 DIAGNOSIS — Z8249 Family history of ischemic heart disease and other diseases of the circulatory system: Secondary | ICD-10-CM | POA: Diagnosis not present

## 2020-07-25 DIAGNOSIS — I2510 Atherosclerotic heart disease of native coronary artery without angina pectoris: Secondary | ICD-10-CM | POA: Diagnosis present

## 2020-07-25 DIAGNOSIS — M1990 Unspecified osteoarthritis, unspecified site: Secondary | ICD-10-CM | POA: Diagnosis not present

## 2020-07-25 DIAGNOSIS — R29707 NIHSS score 7: Secondary | ICD-10-CM | POA: Diagnosis present

## 2020-07-25 DIAGNOSIS — I69328 Other speech and language deficits following cerebral infarction: Secondary | ICD-10-CM | POA: Diagnosis not present

## 2020-07-25 DIAGNOSIS — Z7901 Long term (current) use of anticoagulants: Secondary | ICD-10-CM | POA: Diagnosis not present

## 2020-07-25 DIAGNOSIS — I130 Hypertensive heart and chronic kidney disease with heart failure and stage 1 through stage 4 chronic kidney disease, or unspecified chronic kidney disease: Secondary | ICD-10-CM | POA: Diagnosis present

## 2020-07-25 DIAGNOSIS — E785 Hyperlipidemia, unspecified: Secondary | ICD-10-CM | POA: Diagnosis present

## 2020-07-25 DIAGNOSIS — E876 Hypokalemia: Secondary | ICD-10-CM | POA: Diagnosis not present

## 2020-07-25 DIAGNOSIS — E7800 Pure hypercholesterolemia, unspecified: Secondary | ICD-10-CM | POA: Diagnosis present

## 2020-07-25 DIAGNOSIS — R5381 Other malaise: Secondary | ICD-10-CM | POA: Diagnosis not present

## 2020-07-25 LAB — LIPID PANEL
Cholesterol: 112 mg/dL (ref 0–200)
HDL: 54 mg/dL (ref >40)
LDL Cholesterol: 42 mg/dL (ref 0–99)
Total CHOL/HDL Ratio: 2.1 RATIO
Triglycerides: 81 mg/dL (ref <150)
VLDL: 16 mg/dL (ref 0–40)

## 2020-07-25 LAB — HEMOGLOBIN A1C
Hgb A1c MFr Bld: 5.1 % (ref 4.8–5.6)
Mean Plasma Glucose: 99.67 mg/dL

## 2020-07-25 LAB — ECHOCARDIOGRAM COMPLETE
AR max vel: 1.75 cm2
AV Area VTI: 1.77 cm2
AV Area mean vel: 1.59 cm2
AV Mean grad: 5.2 mmHg
AV Peak grad: 8.6 mmHg
Ao pk vel: 1.47 m/s
Area-P 1/2: 1.63 cm2
S' Lateral: 2.1 cm
Weight: 1477.96 oz

## 2020-07-25 LAB — BASIC METABOLIC PANEL
Anion gap: 9 (ref 5–15)
BUN: 16 mg/dL (ref 8–23)
CO2: 23 mmol/L (ref 22–32)
Calcium: 8.9 mg/dL (ref 8.9–10.3)
Chloride: 105 mmol/L (ref 98–111)
Creatinine, Ser: 1.13 mg/dL — ABNORMAL HIGH (ref 0.44–1.00)
GFR calc Af Amer: 51 mL/min — ABNORMAL LOW (ref >60)
GFR calc non Af Amer: 44 mL/min — ABNORMAL LOW (ref >60)
Glucose, Bld: 79 mg/dL (ref 70–99)
Potassium: 3.1 mmol/L — ABNORMAL LOW (ref 3.5–5.1)
Sodium: 137 mmol/L (ref 135–145)

## 2020-07-25 LAB — MAGNESIUM: Magnesium: 2.3 mg/dL (ref 1.7–2.4)

## 2020-07-25 MED ORDER — MELATONIN 3 MG PO TABS
6.0000 mg | ORAL_TABLET | Freq: Every evening | ORAL | Status: DC | PRN
  Administered 2020-07-25: 6 mg via ORAL
  Filled 2020-07-25: qty 2

## 2020-07-25 NOTE — Progress Notes (Signed)
PROGRESS NOTE  Catherine Anthony ZOX:[id number] DOB: 1930-08-17 DOA: 07/24/2020 PCP: [name], MD  Brief History:  86 y.o. 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  The patient woke up around 8 AM in the morning of 07/24/2020 and she was normal at that time.  The patient was sitting on her couch trying to get up to the kitchen when she had difficulty getting up and noted the left-sided weakness.  The patient denied any visual disturbance, headache, dysarthria, or dysesthesias.  She denied any fevers, chills, chest pain, shortness of breath, coughing, hemoptysis, nausea, vomiting, diarrhea, abdominal pain, dysuria, hematuria. EMS was activated, and code stroke was initiated. In the emergency department, the patient was afebrile hemodynamically stable with oxygen saturation 99% on room air.  BMP was unremarkable except for potassium 2.4.  LFTs were unremarkable.  CBC and LFTs were unremarkable.  CT of the brain was negative for any acute findings but showed an age-indeterminate infarct in the right cerebellum.  CT brain perfusion was negative for LVO.  There was no evidence of core infarct or territory at risk.  CT head and neck showed 50% stenosis of the right ICA and less than 50% stenosis of the left ICA.  Neurology was consulted to assist with management.  Assessment/Plan: Left hemiplegia -Concerned about right MCA stroke -Neurology Consulted -PT/OT evaluation-->SNF -Speech therapy eval-->dys 3 with thin liquids -CT brain--neg for acute finding; age indeterminant R-cerebellar infarct -MRI brain--Acute infarction affecting the right external capsule and corona radiata;  2 punctate acute infarctions left frontal cortex -MRA brain--no LVO -CTA H&N--50% R-ICA stenosis;  <50% L-ICA stenosis -Echo--EF 65-70%, no source of  thrombi -LDL--42 -HbA1C--5.1 -Antiplatelet--ASA 81 mg x 1 week+apixaban -restarted Eliquis per neurology  Essential hypertension -Continue lower dose metoprolol -Holding hydralazine, lisinopril to allow for permissive hypertension -hydralazine prn SBP >220  Hyperlipidemia -Continue Crestor -Check lipid panel--LDL 42  CKD stage IIIb -Baseline creatinine 1.0-1.3 -A.m. BMP  Paroxysmal atrial fibrillation -Continue lower dose metoprolol -currently in sinus -Holding apixaban pending neurology evaluation -Restart apixaban once cleared by neurology -Continue amiodarone  Status post TAVR -echo--aortic valve was not well visualized  Hypokalemia -Replete -Check magnesium--2.3      Status is: Inpatient  Remains inpatient appropriate because:IV treatments appropriate due to intensity of illness or inability to take PO   Dispo: The patient is from: Home              Anticipated d/c is to: SNF              Anticipated d/c date is: 1 day              Patient currently is medically stable to d/c.        Family Communication:   Family at bedside  Consultants:  neurology  Code Status:  FULL   DVT Prophylaxis:  apixaban   Procedures: As Listed in Progress Note Above  Antibiotics: None      Subjective: Patient states her left sided weakness not much better.  No visual disturbance or dysarthria.  Patient denies fevers, chills, headache, chest pain, dyspnea, nausea, vomiting, diarrhea, abdominal pain, dysuria, hematuria, hematochezia, and melena.   Objective: Vitals:   07/25/20 0000 07/25/20 0210 07/25/20 0407 07/25/20 0603  BP: (!) 197/72 (!) 173/82 (!) 163/73 (!) 167/74  Pulse: 71 67 61 60  Resp: 20 18 20 18   Temp: 97.6 F (36.4 C) 98 F (36.7 C) 97.9 F (36.6 C) 97.7 F (36.5 C)  TempSrc: Oral  Oral Oral  SpO2: 100% 99% 100% 99%  Weight:        Intake/Output Summary (Last 24 hours) at 07/25/2020 1403 Last data filed at 07/25/2020  0900 Gross per 24 hour  Intake 186.83 ml  Output 850 ml  Net -663.17 ml   Weight change:  Exam:   General:  Pt is alert, follows commands appropriately, not in acute distress  HEENT: No icterus, No thrush, No neck mass, [place]/AT  Cardiovascular: RRR, S1/S2, no rubs, no gallops  Respiratory: CTA bilaterally, no wheezing, no crackles, no rhonchi  Abdomen: Soft/+BS, non tender, non distended, no guarding  Extremities: No edema, No lymphangitis, No petechiae, No rashes, no synovitis  Neuro:  CN II-XII intact, strength 4/5 in RUE, RLE, strength 0/5 LUE, LLE; sensation intact bilateral; no dysmetria; babinski equivocal     Data Reviewed: I have personally reviewed following labs and imaging studies Basic Metabolic Panel: Recent Labs  Lab 07/24/20 1205 07/24/20 1210 07/25/20 0623  NA 136 139 137  K 2.4* 2.4* 3.1*  CL 98 97* 105  CO2 27  --  23  GLUCOSE 103* 100* 79  BUN 18 17 16   CREATININE 1.26* 1.30* 1.13*  CALCIUM 8.9  --  8.9  MG 2.3  --  2.3   Liver Function Tests: Recent Labs  Lab 07/24/20 1205  AST 23  ALT 19  ALKPHOS 43  BILITOT 1.0  PROT 6.4*  ALBUMIN 3.6   No results for input(s): LIPASE, AMYLASE in the last 168 hours. No results for input(s): AMMONIA in the last 168 hours. Coagulation Profile: Recent Labs  Lab 07/24/20 1205  INR 1.2   CBC: Recent Labs  Lab 07/24/20 1205 07/24/20 1210  WBC 6.5  --   NEUTROABS 3.5  --   HGB 11.9* 11.9*  HCT 35.3* 35.0*  MCV 99.2  --   PLT 167  --    Cardiac Enzymes: No results for input(s): CKTOTAL, CKMB, CKMBINDEX, TROPONINI in the last 168 hours. BNP: Invalid input(s): POCBNP CBG: Recent Labs  Lab 07/24/20 1205  GLUCAP 82   HbA1C: Recent Labs    07/24/20 1205  HGBA1C 5.1   Urine analysis:    Component Value Date/Time   COLORURINE YELLOW 06/01/2019 1144   APPEARANCEUR CLEAR 06/01/2019 1144   LABSPEC 1.011 06/01/2019 1144   PHURINE 5.0 06/01/2019 1144   GLUCOSEU NEGATIVE 06/01/2019 1144    HGBUR MODERATE (A) 06/01/2019 1144   BILIRUBINUR NEGATIVE 06/01/2019 1144   KETONESUR NEGATIVE 06/01/2019 1144   PROTEINUR NEGATIVE 06/01/2019 1144   NITRITE NEGATIVE 06/01/2019 1144   LEUKOCYTESUR MODERATE (A) 06/01/2019 1144   Sepsis Labs: @LABRCNTIP (procalcitonin:4,lacticidven:4) ) Recent Results (from the past 240 hour(s))  SARS Coronavirus 2 by RT PCR (hospital order, performed in [facility] hospital lab) Nasopharyngeal Nasopharyngeal Swab     Status: None   Collection Time: 07/24/20 12:20 PM   Specimen: Nasopharyngeal Swab  Result Value Ref Range Status   SARS Coronavirus 2 NEGATIVE NEGATIVE Final    Comment: (NOTE) SARS-CoV-2 target nucleic acids are NOT DETECTED.  The SARS-CoV-2 RNA is generally detectable in upper and lower respiratory specimens during the acute phase of infection. The lowest concentration of SARS-CoV-2 viral copies this assay can detect is 250 copies / mL. A negative result does not preclude SARS-CoV-2 infection and should not be used as the sole  basis for treatment or other patient management decisions.  A negative result may occur with improper specimen collection / handling, submission of specimen other than nasopharyngeal swab, presence of viral mutation(s) within the areas targeted by this assay, and inadequate number of viral copies (<250 copies / mL). A negative result must be combined with clinical observations, patient history, and epidemiological information.  Fact Sheet for Patients:   BoilerBrush.com.cy  Fact Sheet for Healthcare Providers: https://pope.com/  This test is not yet approved or  cleared by the [place] FDA and has been authorized for detection and/or diagnosis of SARS-CoV-2 by FDA under an Emergency Use Authorization (EUA).  This EUA will remain in effect (meaning this test can be used) for the duration of the COVID-19 declaration under Section 564(b)(1) of the Act, 21  U.S.C. section 360bbb-3(b)(1), unless the authorization is terminated or revoked sooner.  Performed at [facility], [address] [place], [place] [address]      Scheduled Meds: .  stroke: mapping our early stages of recovery book   Does not apply Once  . [START ON 07/26/2020] amiodarone  200 mg Oral Once per day on Mon Fri  . apixaban  2.5 mg Oral BID  . aspirin EC  81 mg Oral Daily  . metoprolol tartrate  12.5 mg Oral BID  . mirtazapine  7.5-15 mg Oral QHS  . rosuvastatin  20 mg Oral Daily   Continuous Infusions: . sodium chloride 10 mL/hr at 07/24/20 1819    Procedures/Studies: CT Angio Head W or Wo Contrast  Result Date: 07/24/2020 CLINICAL DATA:  Left-sided weakness EXAM: CT ANGIOGRAPHY HEAD AND NECK CT PERFUSION BRAIN TECHNIQUE: Multidetector CT imaging of the head and neck was performed using the standard protocol during bolus administration of intravenous contrast. Multiplanar CT image reconstructions and MIPs were obtained to evaluate the vascular anatomy. Carotid stenosis measurements (when applicable) are obtained utilizing NASCET criteria, using the distal internal carotid diameter as the denominator. Multiphase CT imaging of the brain was performed following IV bolus contrast injection. Subsequent parametric perfusion maps were calculated using RAPID software. CONTRAST:  40mL OMNIPAQUE IOHEXOL 350 MG/ML SOLN, 60mL OMNIPAQUE IOHEXOL 350 MG/ML SOLN COMPARISON:  None. FINDINGS: CTA NECK FINDINGS Aortic arch: Great vessel origins are patent with calcified plaque present. Right carotid system: Patent. Mixed but primarily calcified plaque at the ICA origin causing approximately 50% stenosis. Left carotid system: Patent. Mixed but primarily calcified plaque at the ICA origin causing less than 50% stenosis. Vertebral arteries: Patent. Right vertebral artery is dominant. There is calcified plaque at the left vertebral origin with potential significant stenosis. Skeleton: Degenerative  changes of the cervical spine. Other neck: No mass or adenopathy. Upper chest: No apical lung mass Review of the MIP images confirms the above findings CTA HEAD FINDINGS Anterior circulation: Intracranial internal carotid arteries are patent with mild calcified plaque. Anterior and middle cerebral arteries are patent. Right A1 ACA is dominant. Mild stenosis of the left M1 MCA. Posterior circulation: Intracranial vertebral arteries are patent with mild atherosclerotic irregularity. Patent PICA origins. Basilar artery is patent. Posterior cerebral arteries are patent. Large bilateral posterior communicating arteries. Venous sinuses: As permitted by contrast timing, patent. Review of the MIP images confirms the above findings CT Brain Perfusion Findings: CBF (<30%) Volume: 0mL Perfusion (Tmax>6.0s) volume: 0mL Mismatch Volume: 0mL Infarction Location:None. IMPRESSION: No large vessel occlusion. Perfusion imaging demonstrates no evidence of core infarction or territory at risk. Primarily calcified plaque at the ICA origins causing approximately 50% stenosis on  the right and less than 50% stenosis on the left. Mild stenosis of the intracranial carotids and left M1 MCA. Electronically Signed   By: Guadlupe Spanish M.D.   On: 07/24/2020 12:49   DG Chest 2 View  Result Date: 07/24/2020 CLINICAL DATA:  Stroke, potential discharge EXAM: CHEST - 2 VIEW COMPARISON:  06/06/2019 FINDINGS: Heart is normal size. Mild hyperinflation/COPD. No confluent opacities or effusions. Prior aortic valve repair. No acute bony abnormality. IMPRESSION: Hyperinflation/COPD.  No active disease. Electronically Signed   By: Charlett Nose M.D.   On: 07/24/2020 19:41   CT Angio Neck W and/or Wo Contrast  Result Date: 07/24/2020 CLINICAL DATA:  Left-sided weakness EXAM: CT ANGIOGRAPHY HEAD AND NECK CT PERFUSION BRAIN TECHNIQUE: Multidetector CT imaging of the head and neck was performed using the standard protocol during bolus administration of  intravenous contrast. Multiplanar CT image reconstructions and MIPs were obtained to evaluate the vascular anatomy. Carotid stenosis measurements (when applicable) are obtained utilizing NASCET criteria, using the distal internal carotid diameter as the denominator. Multiphase CT imaging of the brain was performed following IV bolus contrast injection. Subsequent parametric perfusion maps were calculated using RAPID software. CONTRAST:  40mL OMNIPAQUE IOHEXOL 350 MG/ML SOLN, 60mL OMNIPAQUE IOHEXOL 350 MG/ML SOLN COMPARISON:  None. FINDINGS: CTA NECK FINDINGS Aortic arch: Great vessel origins are patent with calcified plaque present. Right carotid system: Patent. Mixed but primarily calcified plaque at the ICA origin causing approximately 50% stenosis. Left carotid system: Patent. Mixed but primarily calcified plaque at the ICA origin causing less than 50% stenosis. Vertebral arteries: Patent. Right vertebral artery is dominant. There is calcified plaque at the left vertebral origin with potential significant stenosis. Skeleton: Degenerative changes of the cervical spine. Other neck: No mass or adenopathy. Upper chest: No apical lung mass Review of the MIP images confirms the above findings CTA HEAD FINDINGS Anterior circulation: Intracranial internal carotid arteries are patent with mild calcified plaque. Anterior and middle cerebral arteries are patent. Right A1 ACA is dominant. Mild stenosis of the left M1 MCA. Posterior circulation: Intracranial vertebral arteries are patent with mild atherosclerotic irregularity. Patent PICA origins. Basilar artery is patent. Posterior cerebral arteries are patent. Large bilateral posterior communicating arteries. Venous sinuses: As permitted by contrast timing, patent. Review of the MIP images confirms the above findings CT Brain Perfusion Findings: CBF (<30%) Volume: 0mL Perfusion (Tmax>6.0s) volume: 0mL Mismatch Volume: 0mL Infarction Location:None. IMPRESSION: No large  vessel occlusion. Perfusion imaging demonstrates no evidence of core infarction or territory at risk. Primarily calcified plaque at the ICA origins causing approximately 50% stenosis on the right and less than 50% stenosis on the left. Mild stenosis of the intracranial carotids and left M1 MCA. Electronically Signed   By: Guadlupe Spanish M.D.   On: 07/24/2020 12:49   MR ANGIO HEAD WO CONTRAST  Result Date: 07/24/2020 CLINICAL DATA:  Left-sided weakness noticed today. EXAM: MRI HEAD WITHOUT CONTRAST MRA HEAD WITHOUT CONTRAST TECHNIQUE: Multiplanar, multiecho pulse sequences of the brain and surrounding structures were obtained without intravenous contrast. Angiographic images of the head were obtained using MRA technique without contrast. COMPARISON:  CT studies same day. FINDINGS: MRI HEAD FINDINGS Brain: Diffusion imaging shows acute infarction on the right affecting the external capsule and corona radiata region. No hemorrhage or mass effect. 2 acute punctate infarctions in the left frontal cortex region. Old small vessel cerebellar infarctions. Old small vessel infarction right pons. Wallerian degeneration on the right. Old infarction of the right caudate head and adjacent white  matter tracks per moderate chronic small-vessel ischemic changes of the white matter elsewhere. No cortical or large vessel territory infarction. No sign of hemorrhage, hydrocephalus or extra-axial collection. Vascular: Major vessels at the base of the brain show flow. Skull and upper cervical spine: Negative Sinuses/Orbits: Clear/normal Other: Small left mastoid effusion. MRA HEAD FINDINGS Some motion degradation. Both internal carotid arteries are patent through the skull base and siphon regions. Both anterior and middle cerebral arteries are patent. Distal vessels not accurately evaluated because of motion. Right vertebral artery is widely patent to the basilar. Question abnormal slow or retrograde flow in the left vertebral artery.  No basilar stenosis. Flow is present in the superior cerebellar and posterior cerebral arteries. Primary anterior circulation supply of the posterior cerebral arteries. Again, distal vessel detail is compromised by motion. IMPRESSION: Acute infarction affecting the right external capsule and corona radiata. No hemorrhage or mass effect. Two punctate acute infarctions in the left frontal cortex. Chronic small-vessel ischemic change elsewhere. Old infarction right caudate head and adjacent white matter. Motion degraded intracranial MR angiography does not show any large or medium vessel occlusion. Apparent slow or retrograde flow in the distal left vertebral artery, not confidently established because of motion. Electronically Signed   By: Paulina Fusi M.D.   On: 07/24/2020 15:23   MR BRAIN WO CONTRAST  Result Date: 07/24/2020 CLINICAL DATA:  Left-sided weakness noticed today. EXAM: MRI HEAD WITHOUT CONTRAST MRA HEAD WITHOUT CONTRAST TECHNIQUE: Multiplanar, multiecho pulse sequences of the brain and surrounding structures were obtained without intravenous contrast. Angiographic images of the head were obtained using MRA technique without contrast. COMPARISON:  CT studies same day. FINDINGS: MRI HEAD FINDINGS Brain: Diffusion imaging shows acute infarction on the right affecting the external capsule and corona radiata region. No hemorrhage or mass effect. 2 acute punctate infarctions in the left frontal cortex region. Old small vessel cerebellar infarctions. Old small vessel infarction right pons. Wallerian degeneration on the right. Old infarction of the right caudate head and adjacent white matter tracks per moderate chronic small-vessel ischemic changes of the white matter elsewhere. No cortical or large vessel territory infarction. No sign of hemorrhage, hydrocephalus or extra-axial collection. Vascular: Major vessels at the base of the brain show flow. Skull and upper cervical spine: Negative Sinuses/Orbits:  Clear/normal Other: Small left mastoid effusion. MRA HEAD FINDINGS Some motion degradation. Both internal carotid arteries are patent through the skull base and siphon regions. Both anterior and middle cerebral arteries are patent. Distal vessels not accurately evaluated because of motion. Right vertebral artery is widely patent to the basilar. Question abnormal slow or retrograde flow in the left vertebral artery. No basilar stenosis. Flow is present in the superior cerebellar and posterior cerebral arteries. Primary anterior circulation supply of the posterior cerebral arteries. Again, distal vessel detail is compromised by motion. IMPRESSION: Acute infarction affecting the right external capsule and corona radiata. No hemorrhage or mass effect. Two punctate acute infarctions in the left frontal cortex. Chronic small-vessel ischemic change elsewhere. Old infarction right caudate head and adjacent white matter. Motion degraded intracranial MR angiography does not show any large or medium vessel occlusion. Apparent slow or retrograde flow in the distal left vertebral artery, not confidently established because of motion. Electronically Signed   By: Paulina Fusi M.D.   On: 07/24/2020 15:23   CT CEREBRAL PERFUSION W CONTRAST  Result Date: 07/24/2020 CLINICAL DATA:  Left-sided weakness EXAM: CT ANGIOGRAPHY HEAD AND NECK CT PERFUSION BRAIN TECHNIQUE: Multidetector CT imaging of the head  and neck was performed using the standard protocol during bolus administration of intravenous contrast. Multiplanar CT image reconstructions and MIPs were obtained to evaluate the vascular anatomy. Carotid stenosis measurements (when applicable) are obtained utilizing NASCET criteria, using the distal internal carotid diameter as the denominator. Multiphase CT imaging of the brain was performed following IV bolus contrast injection. Subsequent parametric perfusion maps were calculated using RAPID software. CONTRAST:  67mL OMNIPAQUE  IOHEXOL 350 MG/ML SOLN, 43mL OMNIPAQUE IOHEXOL 350 MG/ML SOLN COMPARISON:  None. FINDINGS: CTA NECK FINDINGS Aortic arch: Great vessel origins are patent with calcified plaque present. Right carotid system: Patent. Mixed but primarily calcified plaque at the ICA origin causing approximately 50% stenosis. Left carotid system: Patent. Mixed but primarily calcified plaque at the ICA origin causing less than 50% stenosis. Vertebral arteries: Patent. Right vertebral artery is dominant. There is calcified plaque at the left vertebral origin with potential significant stenosis. Skeleton: Degenerative changes of the cervical spine. Other neck: No mass or adenopathy. Upper chest: No apical lung mass Review of the MIP images confirms the above findings CTA HEAD FINDINGS Anterior circulation: Intracranial internal carotid arteries are patent with mild calcified plaque. Anterior and middle cerebral arteries are patent. Right A1 ACA is dominant. Mild stenosis of the left M1 MCA. Posterior circulation: Intracranial vertebral arteries are patent with mild atherosclerotic irregularity. Patent PICA origins. Basilar artery is patent. Posterior cerebral arteries are patent. Large bilateral posterior communicating arteries. Venous sinuses: As permitted by contrast timing, patent. Review of the MIP images confirms the above findings CT Brain Perfusion Findings: CBF (<30%) Volume: 105mL Perfusion (Tmax>6.0s) volume: 58mL Mismatch Volume: 32mL Infarction Location:None. IMPRESSION: No large vessel occlusion. Perfusion imaging demonstrates no evidence of core infarction or territory at risk. Primarily calcified plaque at the ICA origins causing approximately 50% stenosis on the right and less than 50% stenosis on the left. Mild stenosis of the intracranial carotids and left M1 MCA. Electronically Signed   By: Guadlupe Spanish M.D.   On: 07/24/2020 12:49   ECHOCARDIOGRAM COMPLETE  Result Date: 07/25/2020    ECHOCARDIOGRAM REPORT   Patient  Name:   Catherine Anthony Date of Exam: 07/25/2020 Medical Rec #:  [id number]       Height:       64.0 in Accession #:    [id number]      Weight:       92.4 lb Date of Birth:  02-18-1930       BSA:          1.409 m Patient Age:    89 years        BP:           167/74 mmHg Patient Gender: F               HR:           60 bpm. [facility] Procedure: 2D Echo Indications:    stroke 434.91  History:        Patient has prior history of Echocardiogram examinations, most                 recent 05/09/2020. CAD; Risk Factors:Hypertension and                 Dyslipidemia. S/p TAVR. PAF. Hx of severe AS.  Sonographer:    Celene Skeen RDCS (AE) Referring Phys: [phone] Eshaal Duby  Sonographer Comments: Suboptimal parasternal window. IMPRESSIONS  1. Left ventricular ejection fraction, by estimation, is 65 to  70%. The left ventricle has normal function. The left ventricle has no regional wall motion abnormalities. Left ventricular diastolic parameters are consistent with Grade I diastolic dysfunction (impaired relaxation). Elevated left atrial pressure.  2. Right ventricular systolic function was not well visualized. The right ventricular size is not well visualized. There is mildly elevated pulmonary artery systolic pressure.  3. The mitral valve is normal in structure. Mild mitral valve regurgitation. No evidence of mitral stenosis.  4. Edwards Sapien 3 Ultra THV size 23 mm. The aortic valve was not well visualized. Aortic valve regurgitation is not visualized. No aortic stenosis is present. FINDINGS  Left Ventricle: Left ventricular ejection fraction, by estimation, is 65 to 70%. The left ventricle has normal function. The left ventricle has no regional wall motion abnormalities. The left ventricular internal cavity size was normal in size. There is  no left ventricular hypertrophy. Left ventricular diastolic parameters are consistent with Grade I diastolic dysfunction (impaired relaxation). Elevated left atrial pressure.  Right Ventricle: The right ventricular size is not well visualized. Right vetricular wall thickness was not assessed. Right ventricular systolic function was not well visualized. There is mildly elevated pulmonary artery systolic pressure. The tricuspid regurgitant velocity is 2.92 m/s, and with an assumed right atrial pressure of 8 mmHg, the estimated right ventricular systolic pressure is 42.1 mmHg. Left Atrium: Left atrial size was normal in size. Right Atrium: Right atrial size was not well visualized. Pericardium: There is no evidence of pericardial effusion. Mitral Valve: The mitral valve is normal in structure. Moderate mitral annular calcification. Mild mitral valve regurgitation. No evidence of mitral valve stenosis. Tricuspid Valve: The tricuspid valve is not well visualized. Tricuspid valve regurgitation is not demonstrated. No evidence of tricuspid stenosis. Aortic Valve: Edwards Sapien 3 Ultra THV size 23 mm. The aortic valve was not well visualized. Aortic valve regurgitation is not visualized. No aortic stenosis is present. Aortic valve mean gradient measures 5.2 mmHg. Aortic valve peak gradient measures 8.6 mmHg. Aortic valve area, by VTI measures 1.77 cm. Pulmonic Valve: The pulmonic valve was not well visualized. Pulmonic valve regurgitation is not visualized. No evidence of pulmonic stenosis. Aorta: The aortic root is normal in size and structure. Pulmonary Artery: Indeterminant PASP, IVC poorly visualized. Venous: The inferior vena cava was not well visualized. IAS/Shunts: The interatrial septum was not well visualized.  LEFT VENTRICLE PLAX 2D LVIDd:         3.40 cm  Diastology LVIDs:         2.10 cm  LV e' lateral:   5.87 cm/s LV PW:         0.88 cm  LV E/e' lateral: 15.8 LV IVS:        1.12 cm  LV e' medial:    4.79 cm/s LVOT diam:     1.80 cm  LV E/e' medial:  19.3 LV SV:         59 LV SV Index:   42 LVOT Area:     2.54 cm  LEFT ATRIUM             Index LA diam:        2.50 cm 1.77 cm/m LA  Vol (A2C):   16.8 ml 11.92 ml/m LA Vol (A4C):   28.8 ml 20.44 ml/m LA Biplane Vol: 24.4 ml 17.32 ml/m  AORTIC VALVE AV Area (Vmax):    1.75 cm AV Area (Vmean):   1.59 cm AV Area (VTI):     1.77 cm AV Vmax:  146.60 cm/s AV Vmean:          108.005 cm/s AV VTI:            0.331 m AV Peak Grad:      8.6 mmHg AV Mean Grad:      5.2 mmHg LVOT Vmax:         101.00 cm/s LVOT Vmean:        67.500 cm/s LVOT VTI:          0.230 m LVOT/AV VTI ratio: 0.70  AORTA Ao Root diam: 2.50 cm MITRAL VALVE                TRICUSPID VALVE MV Area (PHT): 1.63 cm     TR Peak grad:   34.1 mmHg MV Decel Time: 466 msec     TR Vmax:        292.00 cm/s MV E velocity: 92.60 cm/s MV A velocity: 133.00 cm/s  SHUNTS MV E/A ratio:  0.70         Systemic VTI:  0.23 m                             Systemic Diam: 1.80 cm Dina Rich MD Electronically signed by Dina Rich MD Signature Date/Time: 07/25/2020/1:07:29 PM    Final    CT HEAD CODE STROKE WO CONTRAST  Result Date: 07/24/2020 CLINICAL DATA:  Code stroke.  Left-sided weakness EXAM: CT HEAD WITHOUT CONTRAST TECHNIQUE: Contiguous axial images were obtained from the base of the skull through the vertex without intravenous contrast. COMPARISON:  None. FINDINGS: Brain: There is no acute intracranial hemorrhage, mass effect, or edema. There is no acute appearing loss of gray-white differentiation. Prominence of the ventricles and sulci reflects generalized parenchymal volume loss. There is ex vacuo dilatation of the frontal horn of the right lateral ventricle contiguous with an infarct. Patchy and confluent areas of hypoattenuation in the supratentorial white matter are nonspecific but probably reflect moderate chronic microvascular ischemic changes. There are age-indeterminate small vessel infarcts of the right cerebellum. Vascular: Mild focal hyperdensity along a proximal right M2 MCA branch. There is intracranial atherosclerotic calcification at the skull base. Skull:  Unremarkable. Sinuses/Orbits: No acute finding. Other: Mastoid air cells are clear. ASPECTS (Alberta Stroke Program Early CT Score) - Ganglionic level infarction (caudate, lentiform nuclei, internal capsule, insula, M1-M3 cortex): 7 - Supraganglionic infarction (M4-M6 cortex): 3 Total score (0-10 with 10 being normal): 10 IMPRESSION: No acute intracranial hemorrhage or evidence of acute infarction. ASPECT score is 10. Age-indeterminate small vessel infarcts of the right cerebellum. Focus of mild hyperdensity along a proximal right M2 MCA branch may be artifactual. Correlation will be made with forthcoming CT vascular imaging. These results were called by telephone at the time of interpretation on 07/24/2020 at 12:22 pm to provider [facility] , who verbally acknowledged these results. Electronically Signed   By: Guadlupe Spanish M.D.   On: 07/24/2020 12:27    Catarina Hartshorn, DO  [facility]  If 7PM-7AM, please contact night-coverage [email or web address] Password [email or web address] 07/25/2020, 2:03 PM   LOS: 0 days

## 2020-07-25 NOTE — Evaluation (Signed)
Speech Language Pathology Evaluation Patient Details Name: Catherine Anthony MRN: [id number] DOB: 11/26/1930 Today's Date: 07/25/2020 Time: 4098-1191 SLP Time Calculation (min) (ACUTE ONLY): 35 min  Problem List:  Patient Active Problem List   Diagnosis Date Noted  . Acute ischemic stroke (HCC) 07/25/2020  . Left hemiparesis (HCC) 07/24/2020  . Acute left-sided weakness   . Hypokalemia   . Leg edema, left 12/11/2019  . Frequent unifocal PVCs 08/09/2019  . Indication present for endocarditis prophylaxis 08/09/2019  . Mixed hyperlipidemia 08/01/2019  . Acute on chronic diastolic heart failure (HCC) 06/06/2019  . S/P TAVR (transcatheter aortic valve replacement) 06/06/2019  . Essential hypertension   . High cholesterol   . Coronary artery disease   . PAF (paroxysmal atrial fibrillation) (HCC)   . Moderate aortic regurgitation   . Coronary artery disease with stable angina pectoris (HCC) 08/31/2017   Past Medical History:  Past Medical History:  Diagnosis Date  . Arthritis    Right hand  . Complication of anesthesia   . Coronary artery disease    s/p mutliple PCIs  . Family hx of colon cancer   . High cholesterol   . History of kidney stones   . Hypertension   . PAF (paroxysmal atrial fibrillation) (HCC)    on Eliquis  . S/P TAVR (transcatheter aortic valve replacement)   . Severe aortic stenosis    Past Surgical History:  Past Surgical History:  Procedure Laterality Date  . CATARACT EXTRACTION W/ INTRAOCULAR LENS IMPLANT Right 06/04/2011   [facility]  . CATARACT EXTRACTION W/PHACO  06/22/2011   Procedure: CATARACT EXTRACTION PHACO AND INTRAOCULAR LENS PLACEMENT (IOC);  Surgeon: Gemma Payor;  Location: [facility];  Service: Ophthalmology;  Laterality: Left;  CDE 14.34  . COLONOSCOPY  06/23/08  . COLONOSCOPY N/A 01/18/2014   Procedure: COLONOSCOPY;  Surgeon: Malissa Hippo, MD;  Location: [facility] END[facility] SUITE;  Service: Endoscopy;  Laterality: N/A;  145  . CORONARY ANGIOPLASTY WITH STENT  PLACEMENT    . CORONARY ATHERECTOMY N/A 09/21/2017   Procedure: CORONARY ATHERECTOMY;  Surgeon: Elder Negus, MD;  Location: [facility] INVASIVE CV LAB;  Service: Cardiovascular;  Laterality: N/A;  . CORONARY STENT INTERVENTION N/A 08/31/2017   Procedure: CORONARY STENT INTERVENTION;  Surgeon: Elder Negus, MD;  Location: [facility] INVASIVE CV LAB;  Service: Cardiovascular;  Laterality: N/A;  . CORONARY STENT INTERVENTION N/A 09/21/2017   Procedure: CORONARY STENT INTERVENTION;  Surgeon: Elder Negus, MD;  Location: [facility] INVAS[facility] CV LAB;  Service: Cardiovascular;  Laterality: N/A;  . EYE SURGERY Bilateral    "after cataract OR"  . INTRAVASCULAR ULTRASOUND/IVUS N/A 09/21/2017   Procedure: Intravascular Ultrasound/IVUS;  Surgeon: Elder Negus, MD;  [facility]: [facility] CV LAB;  Service: Cardiovascular;  Laterality: N/A;  . RIGHT/LEFT HEART CATH AND CORONARY ANGIOGRAPHY N/A 08/31/2017   Procedure: RIGHT/LEFT HEART CATH AND CORONARY ANGIOGRAPHY;  Surgeon: Elder Negus, MD;  [facility];  Service: Cardiovascular;  Laterality: N/A;  . RIGHT/LEFT HEART CATH AND CORONARY ANGIOGRAPHY N/A 05/16/2019   Procedure: RIGHT/LEFT HEART CATH AND CORONARY ANGIOGRAPHY;  Surgeon: Elder Negus, MD;  [facility];  Service: Cardiovascular;  Laterality: N/A;  . TEE WITHOUT CARDIOVERSION N/A 06/06/2019   Procedure: TRANSESOPHAGEAL ECHOCARDIOGRAM (TEE);  Surgeon: Tonny Bollman, MD;  Location: [facility] INVASIVE CV [facility];  Service: Open Heart Surgery;  Laterality: N/A;  . TRANSCATHETER AORTIC VALVE REPLACEMENT, TRANSFEMORAL N/A 06/06/2019   Procedure: TRANSCATHETER AORTIC VALVE REPLACEMENT, TRANSFEMORAL;  Surgeon: Tonny Bollman, MD;  [facility];  Service: Open Heart Surgery;  Laterality: N/A;  . ULTRASOUND GUIDANCE FOR VASCULAR ACCESS  09/21/2017   Procedure: Ultrasound Guidance For Vascular Access;  Surgeon: Elder Negus, MD;  Location: [facility]  INVASIVE CV LAB;  Service: Cardiovascular;;  . VARICOSE VEIN SURGERY Bilateral 1960s   [facility]   HPI:  Catherine Anthony is a 86 y.o. 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  The patient woke up around 8 AM in the morning of 07/24/2020 and she was normal at that time.  The patient was sitting on her couch trying to get up to the kitchen when she had difficulty getting up and noted the left-sided weakness.  The patient denied any visual disturbance, headache, dysarthria, or dysesthesias.  She denied any fevers, chills, chest pain, shortness of breath, coughing, hemoptysis, nausea, vomiting, diarrhea, abdominal pain, dysuria, hematuria. MRI shows acute infarction affecting the right external capsule and corona radiata. No hemorrhage or mass effect. Two punctate acute infarctions in the left frontal cortex. Chronic small-vessel ischemic change elsewhere. Old infarction right caudate head and adjacent white matter. SLE and BSE requested.   Assessment / Plan / Recommendation Clinical Impression  Speech Language Evaluation completed while Pt was sitting upright in the bed; Pt presents with mild cognitive impairment and mild/moderate dysarthria with intelligibility of words at 75-80% intelligible and conversation (lengthy sentences) at ~65% accuracy variable to listener awareness of context. Cognition was assessed by means of the [facility] SLUMS Examination. Pt achievied a score 24/30 indicating mild impairment with strengths in the area of orientation, calculations, memory; areas of need include general awareness (some conversation is noted to be out of context and socially more lengthy than appropriate however her baseline is unknown), working memory is impaired and questionably related to attention/awareness. Pt reports she typically pays all her bills and  is quite independent however her daughter lives with her and is available to care for her upon d/c. Recommend SNF and a more in depth speech language evaluation and treatment 3-5/wk for above reported impairments. Pt was stimulable for strategies to facilitate intelligibility including slowed rate and over articulation.  ST will continue to follow acutely    SLP Assessment  SLP Recommendation/Assessment: Patient needs continued Speech Lanaguage Pathology Services SLP Visit Diagnosis: Dysphagia, oral phase (R13.11);Dysarthria and anarthria (R47.1);Cognitive communication deficit (R41.841)    Follow Up Recommendations  Skilled Nursing facility;Home health SLP    Frequency and Duration min 2x/week         SLP Evaluation Cognition  Overall Cognitive Status: Within Functional Limits for tasks assessed Arousal/Alertness: Awake/alert Orientation Level: Oriented X4 Attention: Focused Focused Attention: Appears intact Memory: Appears intact Awareness: Impaired Awareness Impairment: Emergent impairment Problem Solving: Impaired Problem Solving Impairment: Verbal basic Executive Function: Reasoning;Decision Making Reasoning: Impaired Reasoning Impairment: Verbal basic Decision Making: Impaired Decision Making Impairment: Verbal basic Safety/Judgment: Impaired       Comprehension  Auditory Comprehension Overall Auditory Comprehension: Appears within functional limits for tasks assessed Visual Recognition/Discrimination Discrimination: Not tested Reading Comprehension Reading Status: Not tested    Expression Expression Primary Mode of Expression: Verbal Verbal Expression Overall Verbal Expression: Appears within functional limits for tasks assessed   Oral / Motor  Oral Motor/Sensory Function Overall Oral Motor/Sensory Function: Moderate impairment Facial ROM: Reduced left;Suspected CN VII (facial) dysfunction Facial Symmetry: Abnormal symmetry left;Suspected CN VII (facial)  dysfunction Facial Strength:  Reduced left;Suspected CN VII (facial) dysfunction Lingual ROM: Within Functional Limits Lingual Symmetry: Within Functional Limits Lingual Strength: Within Functional Limits Motor Speech Overall Motor Speech: Impaired Respiration: Within functional limits Phonation: Normal Resonance: Within functional limits Articulation: Impaired Level of Impairment: Word Intelligibility: Intelligibility reduced Word: 50-74% accurate Phrase: 50-74% accurate Sentence: 50-74% accurate Conversation: 50-74% accurate Motor Planning: Witnin functional limits   GO                   Catherine Anthony H. Romie Levee, CCC-SLP Speech Language Pathologist  Georgetta Haber 07/25/2020, 4:12 PM

## 2020-07-25 NOTE — Plan of Care (Addendum)
  Problem: Acute Rehab PT Goals(only PT should resolve) Goal: Pt Will Go Supine/Side To Sit Outcome: Progressing Flowsheets (Taken 07/25/2020 0958) Pt will go Supine/Side to Sit: with moderate assist Goal: Patient Will Transfer Sit To/From Stand Outcome: Progressing Flowsheets (Taken 07/25/2020 [phone]) Patient will transfer sit to/from stand: with maximum assist Goal: Pt Will Transfer Bed To Chair/Chair To Bed Outcome: Progressing Flowsheets (Taken 07/25/2020 0958) Pt will Transfer Bed to Chair/Chair to Bed: with max assist Goal: Pt Will Ambulate Outcome: Progressing Flowsheets (Taken 07/25/2020 0958) Pt will Ambulate: . 10 feet . with rolling walker . with maximum assist   9:58 AM , 07/25/20 Lorin Picket, SPT Physical Therapy with [place]  [facility] [phone] office   During this treatment session, the therapist was present, participating in and directing the treatment.  10:28 AM, 07/25/20 Ocie Bob, MPT Physical Therapist with [facility] [phone] office [phone] mobile phone

## 2020-07-25 NOTE — Plan of Care (Signed)
  Problem: Acute Rehab OT Goals (only OT should resolve) Goal: Pt. Will Perform Eating Flowsheets (Taken 07/25/2020 0959) Pt Will Perform Eating:  with set-up  sitting Note: EOB Goal: Pt. Will Perform Grooming Flowsheets (Taken 07/25/2020 0959) Pt Will Perform Grooming:  with set-up  sitting Note: EOB Goal: Pt. Will Perform Upper Body Bathing Flowsheets (Taken 07/25/2020 0959) Pt Will Perform Upper Body Bathing:  with min assist  sitting Goal: Pt. Will Perform Upper Body Dressing Flowsheets (Taken 07/25/2020 0959) Pt Will Perform Upper Body Dressing:  with min assist  sitting Goal: Pt. Will Transfer To Toilet Flowsheets (Taken 07/25/2020 [phone]) Pt Will Transfer to Toilet:  with max assist  bedside commode  squat pivot transfer Goal: Pt. Will Perform Toileting-Clothing Manipulation Flowsheets (Taken 07/25/2020 0959) Pt Will Perform Toileting - Clothing Manipulation and hygiene:  with mod assist  sitting/lateral leans  sit to/from stand Goal: Pt/Caregiver Will Perform Home Exercise Program Flowsheets (Taken 07/25/2020 [phone]) Pt/caregiver will Perform Home Exercise Program:  Increased ROM  Increased strength  Left upper extremity  With minimal assist  With written HEP provided Goal: OT Additional ADL Goal #1 Flowsheets (Taken 07/25/2020 0959) Additional ADL Goal #1: Pt will increase her static sitting balance while seated on EOB when completing a morning ADL task for 5-10 minutes with min assist to maintain.

## 2020-07-25 NOTE — NC FL2 (Signed)
[place] MEDICAID FL2 LEVEL OF CARE SCREENING TOOL     IDENTIFICATION  Patient Name: Catherine Anthony Birthdate: 1930-11-21 Sex: female Admission Date (Current Location): 07/24/2020  [facility] and [place] Number:  [employer] and Address:  [facility],  [address], [place] [address]      Provider Number: [id number]  Attending Physician Name and Address:  Catarina Hartshorn, MD  Relative Name and Phone Number:  Demetrice Amstutz    Phone [phone]    Current Level of Care: Hospital Recommended Level of Care: Skilled Nursing Facility Prior Approval Number:    Date Approved/Denied: 07/25/20 PASRR Number: [id number]  Discharge Plan: SNF    Current Diagnoses: Patient Active Problem List   Diagnosis Date Noted  . Acute ischemic stroke (HCC) 07/25/2020  . Left hemiparesis (HCC) 07/24/2020  . Acute left-sided weakness   . Hypokalemia   . Leg edema, left 12/11/2019  . Frequent unifocal PVCs 08/09/2019  . Indication present for endocarditis prophylaxis 08/09/2019  . Mixed hyperlipidemia 08/01/2019  . Acute on chronic diastolic heart failure (HCC) 06/06/2019  . S/P TAVR (transcatheter aortic valve replacement) 06/06/2019  . Essential hypertension   . High cholesterol   . Coronary artery disease   . PAF (paroxysmal atrial fibrillation) (HCC)   . Moderate aortic regurgitation   . Coronary artery disease with stable angina pectoris (HCC) 08/31/2017    Orientation RESPIRATION BLADDER Height & Weight     Self, Time, Situation, Place  Normal Continent, External catheter Weight: 92 lb 6 oz (41.9 kg) Height:     BEHAVIORAL SYMPTOMS/MOOD NEUROLOGICAL BOWEL NUTRITION STATUS      Continent Diet (DYS 3 Room service appropriate? Yes; Fluid consistency: Thin)  AMBULATORY STATUS COMMUNICATION OF NEEDS Skin   Extensive Assist Verbally Normal                       Personal Care Assistance Level of Assistance  Bathing, Feeding, Dressing Bathing Assistance:  Maximum assistance Feeding assistance: Limited assistance Dressing Assistance: Maximum assistance     Functional Limitations Info  Hearing, Sight, Speech Sight Info: Adequate Hearing Info: Adequate Speech Info: Adequate    SPECIAL CARE FACTORS FREQUENCY  OT (By licensed OT), PT (By licensed PT)     PT Frequency: 5x OT Frequency: 5x            Contractures Contractures Info: Not present    Additional Factors Info  Code Status, Allergies Code Status Info: Full Allergies Info: Codeine           Current Medications (07/25/2020):  This is the current hospital active medication list Current Facility-Administered Medications  Medication Dose Route Frequency Provider Last Rate Last Admin  .  stroke: mapping our early stages of recovery book   Does not apply Once Tat, David, MD      . 0.9 %  sodium chloride infusion   Intravenous Continuous Tat, David, MD 10 mL/hr at 07/24/20 1819 New Bag at 07/24/20 1819  . acetaminophen (TYLENOL) tablet 650 mg  650 mg Oral Q4H PRN Catarina Hartshorn, MD   650 mg at 07/25/20 0116   Or  . acetaminophen (TYLENOL) 160 MG/5ML solution 650 mg  650 mg Per Tube Q4H PRN Tat, Onalee Hua, MD       Or  . acetaminophen (TYLENOL) suppository 650 mg  650 mg Rectal Q4H PRN Tat, Onalee Hua, MD      . Melene Muller ON 07/26/2020] amiodarone (PACERONE) tablet 200 mg  200 mg Oral  Once per day on Mon Fri Tat, David, MD      . apixaban Everlene Balls) tablet 2.5 mg  2.5 mg Oral BID Beryle Beams, MD   2.5 mg at 07/25/20 1012  . aspirin EC tablet 81 mg  81 mg Oral Daily Tat, David, MD   81 mg at 07/25/20 1013  . hydrALAZINE (APRESOLINE) injection 10 mg  10 mg Intravenous Q6H PRN Tat, David, MD      . melatonin tablet 6 mg  6 mg Oral QHS PRN Marikay Alar, FNP   6 mg at 07/25/20 0115  . metoprolol tartrate (LOPRESSOR) tablet 12.5 mg  12.5 mg Oral BID Tat, Onalee Hua, MD   12.5 mg at 07/25/20 1012  . mirtazapine (REMERON) tablet 7.5-15 mg  7.5-15 mg Oral Maura Crandall, MD   15 mg at 07/24/20 2153  .  rosuvastatin (CRESTOR) tablet 20 mg  20 mg Oral Daily Tat, David, MD   20 mg at 07/25/20 1013  . senna-docusate (Senokot-S) tablet 1 tablet  1 tablet Oral QHS PRN Catarina Hartshorn, MD         Discharge Medications: Please see discharge summary for a list of discharge medications.  Relevant Imaging Results:  Relevant Lab Results:   Additional Information Pt SSN [id number]  Barry Brunner, LCSW

## 2020-07-25 NOTE — Telephone Encounter (Signed)
Called daughter Lura Em, left voicemail.

## 2020-07-25 NOTE — Plan of Care (Signed)
  Problem: SLP Cognition Goals Goal: Patient will demonstrate awareness during Description: Patient will demonstrate awareness during functional ADL for improved safety Flowsheets (Taken 07/25/2020 1635) Patient will demonstrate ____ awareness during functional ADL for improved safety with _____: anticipatory   Problem: SLP Language Goals Goal: Patient will utilize speech intelligibility Description: Patient will utilize speech intelligibility strategies to  enhance communication with Flowsheets (Taken 07/25/2020 1635) Patient will utilize speech intelligibility strategies to enhance communication with ____: max assist    Thank you, Mariluz Crespo H. Romie Levee, CCC-SLP Speech Language Pathologist

## 2020-07-25 NOTE — Evaluation (Addendum)
Physical Therapy Evaluation Patient Details Name: Catherine Anthony MRN: [id number] DOB: 08-08-1930 Today's Date: 07/25/2020   History of Present Illness  Patient 86 year old right-handed white female who usually is functional at baseline. She tells me that she can do for herself.  She stays with her daughter. She woke up and noted that she was quite weak on the left side. She decided to seek medical attention. She does have paroxysmal atrial fibrillation and anticoagulation. The patient does not report dysarthria, dysphagia, chest pain or, palpitation, headaches or shortness of breath. The review systems otherwise negative.    Clinical Impression  The patient had significant sensation and strength deficits in LUE/LLE. The patient required max assist for all bed mobility and transfers today. Due to poor sitting balance, the patient required constant mod/max assistance in order to maintain upright posture. She had trace contractions in LLE and states she "wishes she could do more with her leg." The patient was left in bed with pillow under LUE- RN notified. PLAN: The patient will continue to benefit from skilled physical therapy services in hospital at recommended venue below in order to improve balance, gait, and ADL's to promote independence in functional activities.      Follow Up Recommendations SNF    Equipment Recommendations    None recommended by PT.    Recommendations for Other Services   None recommended by PT.     Precautions / Restrictions Precautions Precautions: Fall Precaution Comments: left side hemiplegia Restrictions Weight Bearing Restrictions: No      Mobility  Bed Mobility Overal bed mobility: Needs Assistance Bed Mobility: Rolling;Sidelying to Sit;Supine to Sit;Sit to Supine Rolling: Max assist Sidelying to sit: Max assist Supine to sit: Max assist Sit to supine: Max assist   General bed mobility comments: pt unable to power through LUE in order to reach  upright positioning  Transfers Overall transfer level: Needs assistance   Transfers: Lateral/Scoot Transfers          Lateral/Scoot Transfers: Max assist General transfer comment: LUE hanging at side without use during transfer  Ambulation/Gait                Stairs            Wheelchair Mobility    Modified Rankin (Stroke Patients Only)       Balance Overall balance assessment: Needs assistance Sitting-balance support: Single extremity supported;Feet supported Sitting balance-Leahy Scale: Poor Sitting balance - Comments: required constant mod/max assist to avoid falling to L side Postural control: Left lateral lean Standing balance support:  (unable to assess due to pt mobility status)                                 Pertinent Vitals/Pain Pain Assessment: No/denies pain    Home Living Family/patient expects to be discharged to:: Private residence Living Arrangements: Children (daughter) Available Help at Discharge: Family;Available PRN/intermittently Type of Home: House Home Access: Stairs to enter Entrance Stairs-Rails: Right Entrance Stairs-Number of Steps: 5 Home Layout: Two level;Bed/bath upstairs Home Equipment: Cane - single point Additional Comments: Patient does not use the cane. No AD used at baseline.    Prior Function Level of Independence: Independent         Comments: children assisted with driving and shopping. pt completed all dressing, bathing, grooming independently     Hand Dominance   Dominant Hand: Right    Extremity/Trunk Assessment   Upper Extremity Assessment  Upper Extremity Assessment: Defer to OT evaluation LUE Deficits / Details: No active movement in LUE. 1/5 muscle activation noted during shoulder flexion attempt while seated on EOB. 0/5 for extension, scapular retraction/elevation, elbow movement, wrist movement, and hand.Slight increase in tone noted during passive shoulder flexion. Modified  Ashworth scale: 1 LUE Sensation: decreased proprioception;decreased light touch LUE Coordination: decreased fine motor;decreased gross motor    Lower Extremity Assessment Lower Extremity Assessment: RLE deficits/detail;LLE deficits/detail;Generalized weakness RLE Deficits / Details: hip flex 3-/5 full ROM against gravity but unable to hold end range position; knee ext 4+/5, DF 5/5 RLE Sensation: WNL LLE Deficits / Details: hip flex 1/5; knee ext 1/5; DF 0/5 (flaccid) LLE Sensation: decreased light touch;decreased proprioception    Cervical / Trunk Assessment Cervical / Trunk Assessment:  (falling toward left side-see balance deficits)  Communication   Communication: Expressive difficulties;HOH  Cognition Arousal/Alertness: Awake/alert Behavior During Therapy: WFL for tasks assessed/performed Overall Cognitive Status: Within Functional Limits for tasks assessed                                        General Comments      Exercises     Assessment/Plan    PT Assessment Patient needs continued PT services  PT Problem List Decreased strength;Decreased mobility;Decreased range of motion;Decreased coordination;Decreased balance;Decreased knowledge of use of DME;Impaired sensation       PT Treatment Interventions DME instruction;Therapeutic exercise;Gait training;Balance training;Neuromuscular re-education;Functional mobility training;Therapeutic activities;Patient/family education    PT Goals (Current goals can be found in the Care Plan section)  Acute Rehab PT Goals Patient Stated Goal: to go home PT Goal Formulation: With patient Time For Goal Achievement: 08/08/20 Potential to Achieve Goals: Fair    Frequency Min 3X/week   Barriers to discharge        Co-evaluation PT/OT/SLP Co-Evaluation/Treatment: Yes Reason for Co-Treatment: Complexity of the patient's impairments (multi-system involvement);For patient/therapist safety PT goals addressed during  session: Mobility/safety with mobility OT goals addressed during session: ADL's and self-care;Strengthening/ROM       AM-PAC PT "6 Clicks" Mobility  Outcome Measure Help needed turning from your back to your side while in a flat bed without using bedrails?: A Lot Help needed moving from lying on your back to sitting on the side of a flat bed without using bedrails?: A Lot Help needed moving to and from a bed to a chair (including a wheelchair)?: Total Help needed standing up from a chair using your arms (e.g., wheelchair or bedside chair)?: Total Help needed to walk in hospital room?: Total Help needed climbing 3-5 steps with a railing? : Total 6 Click Score: 8    End of Session Equipment Utilized During Treatment: Gait belt Activity Tolerance: Patient tolerated treatment well Patient left: in bed;with call bell/phone within reach;with bed alarm set Nurse Communication: Mobility status PT Visit Diagnosis: Unsteadiness on feet (R26.81);Other abnormalities of gait and mobility (R26.89);Muscle weakness (generalized) (M62.81);Difficulty in walking, not elsewhere classified (R26.2);Hemiplegia and hemiparesis Hemiplegia - Right/Left: Left Hemiplegia - dominant/non-dominant: Non-dominant Hemiplegia - caused by: Cerebral infarction    Time: 0825-0858 PT Time Calculation (min) (ACUTE ONLY): 33 min   Charges:   PT Evaluation $PT Eval Moderate Complexity: 1 Mod PT Treatments $Therapeutic Activity: 23-37 mins        10:27 AM , 07/25/20 Lorin Picket, SPT Physical Therapy with [facility] [phone] office   During this  treatment session, the therapist was present, participating in and directing the treatment.  10:27 AM, 07/25/20 Ocie Bob, MPT Physical Therapist with [facility] [phone] office [phone] mobile phone

## 2020-07-25 NOTE — Progress Notes (Signed)
  Echocardiogram 2D Echocardiogram has been performed.  Catherine Anthony 07/25/2020, 12:23 PM

## 2020-07-25 NOTE — Plan of Care (Signed)
  Problem: SLP Dysphagia Goals Goal: Patient will utilize recommended strategies Description: Patient will utilize recommended strategies during swallow to increase swallowing safety with Flowsheets (Taken 07/25/2020 1043) Patient will utilize recommended strategies during swallow to increase swallowing safety with: min assist  Thank you,  Havery Moros, CCC-SLP [phone]

## 2020-07-25 NOTE — Evaluation (Signed)
Occupational Therapy Evaluation Patient Details Name: Catherine Anthony MRN: [id number] DOB: 1930/07/20 Today's Date: 07/25/2020    History of Present Illness Patient 86 year old right-handed white female who usually is functional at baseline. She tells me that she can do for herself.  She stays with her daughter. She woke up and noted that she was quite weak on the left side. She decided to seek medical attention. She does have paroxysmal atrial fibrillation and anticoagulation. The patient does not report dysarthria, dysphagia, chest pain or, palpitation, headaches or shortness of breath. The review systems otherwise negative.   Clinical Impression   Pt in bed upon therapy arrival and agreeable to participate in OT and PT evaluation. Patient presents with LUE and LLE hemiplegia causing decreased strength, ROM, coordination, and functional use of her LUE resulting in total assist needed for all ADL tasks. Decreased static sitting balance while seated on EOB requires constant physical assist to maintain balance. At this time, patient is unsafe to return home and requires 24/7 assistance. Recommend SNF at discharge to focus on mentioned deficits. OT will follow patient acutely during stay.     Follow Up Recommendations  SNF    Equipment Recommendations  Other (comment) (defer to next venue)    Recommendations for Other Services       Precautions / Restrictions Precautions Precautions: Fall Precaution Comments: left side hemiplegia Restrictions Weight Bearing Restrictions: No      Mobility Bed Mobility Overal bed mobility: Needs Assistance Bed Mobility: Rolling;Sidelying to Sit;Supine to Sit;Sit to Supine Rolling: Max assist Sidelying to sit: Max assist Supine to sit: Max assist Sit to supine: Max assist      Transfers Overall transfer level:  (Not completed due to safety)                        ADL either performed or assessed with clinical judgement   ADL Overall  ADL's : Needs assistance/impaired Eating/Feeding: Set up;Bed level;Cueing for safety Eating/Feeding Details (indicate cue type and reason): Pt required cues to attend to left side of face for any slipping due to weakness. Grooming: Sitting;Minimal assistance   Upper Body Bathing: Total assistance;Bed level   Lower Body Bathing: Total assistance;Bed level   Upper Body Dressing : Total assistance;Bed level   Lower Body Dressing: Total assistance;Bed level     Toilet Transfer Details (indicate cue type and reason): Not completed at this time due to safety and decreased sitting balance.                 Vision Baseline Vision/History: No visual deficits Patient Visual Report: No change from baseline Vision Assessment?: No apparent visual deficits     Perception     Praxis      Pertinent Vitals/Pain Pain Assessment: No/denies pain     Hand Dominance Right   Extremity/Trunk Assessment Upper Extremity Assessment Upper Extremity Assessment: LUE deficits/detail LUE Deficits / Details: No active movement in LUE. 1/5 muscle activation noted during shoulder flexion attempt while seated on EOB. 0/5 for extension, scapular retraction/elevation, elbow movement, wrist movement, and hand.Slight increase in tone noted during passive shoulder flexion. Modified Ashworth scale: 1 LUE Sensation: decreased proprioception;decreased light touch LUE Coordination: decreased fine motor;decreased gross motor   Lower Extremity Assessment Lower Extremity Assessment: Defer to PT evaluation       Communication Communication Communication: Expressive difficulties;Other (comment) (slurred speech due to left side facial droop.)   Cognition Arousal/Alertness: Awake/alert Behavior During Therapy: WFL for tasks assessed/performed  Overall Cognitive Status: Within Functional Limits for tasks assessed                                                Home Living Family/patient  expects to be discharged to:: Private residence Living Arrangements: Children Available Help at Discharge: Family;Available PRN/intermittently Type of Home: House Home Access: Stairs to enter Entrance Stairs-Number of Steps: 5   Home Layout: Two level;Bed/bath upstairs Alternate Level Stairs-Number of Steps: 15.   Bathroom Shower/Tub: Tub/shower unit   Bathroom Toilet: Standard     Home Equipment: Cane - single point   Additional Comments: Patient does not use the cane. No AD used at baseline.      Prior Functioning/Environment Level of Independence: Independent                 OT Problem List: Decreased strength;Decreased coordination;Impaired sensation;Decreased range of motion;Decreased activity tolerance;Impaired balance (sitting and/or standing);Impaired UE functional use;Decreased knowledge of precautions      OT Treatment/Interventions: Self-care/ADL training;Therapeutic exercise;Therapeutic activities;Neuromuscular education;DME and/or AE instruction;Patient/family education;Manual therapy;Modalities;Balance training;Energy conservation;Splinting    OT Goals(Current goals can be found in the care plan section) Acute Rehab OT Goals Patient Stated Goal: to go home OT Goal Formulation: With patient Time For Goal Achievement: 08/08/20 Potential to Achieve Goals: Good  OT Frequency: Min 2X/week   Barriers to D/C: Inaccessible home environment  Pt has 2 level home with bedroom upstairs.       Co-evaluation PT/OT/SLP Co-Evaluation/Treatment: Yes Reason for Co-Treatment: For patient/therapist safety;To address functional/ADL transfers   OT goals addressed during session: ADL's and self-care;Strengthening/ROM      AM-PAC OT "6 Clicks" Daily Activity     Outcome Measure Help from another person eating meals?: A Little Help from another person taking care of personal grooming?: A Lot Help from another person toileting, which includes using toliet, bedpan, or  urinal?: Total Help from another person bathing (including washing, rinsing, drying)?: Total Help from another person to put on and taking off regular upper body clothing?: Total Help from another person to put on and taking off regular lower body clothing?: Total 6 Click Score: 9   End of Session Equipment Utilized During Treatment: Gait belt Nurse Communication: Other (comment) (need for Purewick to be repositioned)  Activity Tolerance: Patient tolerated treatment well Patient left: in bed;with call bell/phone within reach;with bed alarm set  OT Visit Diagnosis: Muscle weakness (generalized) (M62.81)                Time: 3151-7616 OT Time Calculation (min): 40 min Charges:  OT General Charges $OT Visit: 1 Visit OT Evaluation $OT Eval Moderate Complexity: 1 [address] OTR/L,CBIS  [phone]   Catherine Anthony, Catherine Anthony March 07/25/2020, 9:39 AM

## 2020-07-25 NOTE — Evaluation (Signed)
Clinical/Bedside Swallow Evaluation Patient Details  Name: Catherine Anthony MRN: [id number] Date of Birth: 1930-05-10  Today's Date: 07/25/2020 Time: SLP Start Time (ACUTE ONLY): 3976 SLP Stop Time (ACUTE ONLY): 0953 SLP Time Calculation (min) (ACUTE ONLY): 28 min  Past Medical History:  Past Medical History:  Diagnosis Date  . Arthritis    Right hand  . Complication of anesthesia   . Coronary artery disease    s/p mutliple PCIs  . Family hx of colon cancer   . High cholesterol   . History of kidney stones   . Hypertension   . PAF (paroxysmal atrial fibrillation) (HCC)    on Eliquis  . S/P TAVR (transcatheter aortic valve replacement)   . Severe aortic stenosis    Past Surgical History:  Past Surgical History:  Procedure Laterality Date  . CATARACT EXTRACTION W/ INTRAOCULAR LENS IMPLANT Right 06/04/2011   [facility]  . CATARACT EXTRACTION W/PHACO  06/22/2011   Procedure: CATARACT EXTRACTION PHACO AND INTRAOCULAR LENS PLACEMENT (IOC);  Surgeon: Gemma Payor;  Location: [facility];  Service: Ophthalmology;  Laterality: Left;  CDE 14.34  . COLONOSCOPY  06/23/08  . COLONOSCOPY N/A 01/18/2014   Procedure: COLONOSCOPY;  Surgeon: Malissa Hippo, MD;  Location: [facility] SUITE;  Service: Endoscopy;  Laterality: N/A;  145  . CORONARY ANGIOPLASTY WITH STENT PLACEMENT    . CORONARY ATHERECTOMY N/A 09/21/2017   Procedure: CORONARY ATHERECTOMY;  Surgeon: Elder Negus, MD;  Location: [facility] INVASIVE CV LAB;  Service: Cardiovascular;  Laterality: N/A;  . CORONARY STENT INTERVENTION N/A 08/31/2017   Procedure: CORONARY STENT INTERVENTION;  Surgeon: Elder Negus, MD;  Location: [facility] INVASIVE CV LAB;  Service: Cardiovascular;  Laterality: N/A;  . CORONARY STENT INTERVENTION N/A 09/21/2017   Procedure: CORONARY STENT INTERVENTION;  Surgeon: Elder Negus, MD;  Location: [facility] INVASIVE CV LAB;  Service: Cardiovascular;  Laterality: N/A;  . EYE SURGERY Bilateral    "after cataract OR"  .  INTRAVASCULAR ULTRASOUND/IVUS N/A 09/21/2017   Procedure: Intravascular Ultrasound/IVUS;  Surgeon: Elder Negus, MD;  Location: [facility] CV LAB;  Service: Cardiovascular;  Laterality: N/A;  . RIGHT/LEFT HEART CATH AND CORONARY ANGIOGRAPHY N/A 08/31/2017   Procedure: RIGHT/LEFT HEART CATH AND CORONARY ANGIOGRAPHY;  Surgeon: Elder Negus, MD;  Location: [facility] CV LAB;  Service: Cardiovascular;  Laterality: N/A;  . RIGHT/LEFT HEART CATH AND CORONARY ANGIOGRAPHY N/A 05/16/2019   Procedure: RIGHT/LEFT HEART CATH AND CORONARY ANGIOGRAPHY;  Surgeon: Elder Negus, MD;  Location: [facility];  Service: Cardiovascular;  Laterality: N/A;  . TEE WITHOUT CARDIOVERSION N/A 06/06/2019   Procedure: TRANSESOPHAGEAL ECHOCARDIOGRAM (TEE);  Surgeon: Tonny Bollman, MD;  Location: [facility] INVASIVE CV LAB;  Service: Open Heart Surgery;  Laterality: N/A;  . TRANSCATHETER AORTIC VALVE REPLACEMENT, TRANSFEMORAL N/A 06/06/2019   Procedure: TRANSCATHETER AORTIC VALVE REPLACEMENT, TRANSFEMORAL;  Surgeon: Tonny Bollman, MD;  Location: [facility] CV LAB;  Service: Open Heart Surgery;  Laterality: N/A;  . ULTRASOUND GUIDANCE FOR VASCULAR ACCESS  09/21/2017   Procedure: Ultrasound Guidance For Vascular Access;  Surgeon: Elder Negus, MD;  Location: [facility] INVASIVE CV LAB;  Service: Cardiovascular;;  . VARICOSE VEIN SURGERY Bilateral 1960s   [facility]   HPI:  Catherine Anthony is a 86 y.o. 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  The patient woke up around 8 AM in the morning of 07/24/2020 and she  was normal at that time.  The patient was sitting on her couch trying to get up to the kitchen when she had difficulty getting up and noted the left-sided weakness.  The patient denied any visual disturbance, headache, dysarthria, or dysesthesias.   She denied any fevers, chills, chest pain, shortness of breath, coughing, hemoptysis, nausea, vomiting, diarrhea, abdominal pain, dysuria, hematuria. MRI shows acute infarction affecting the right external capsule and corona radiata. No hemorrhage or mass effect. Two punctate acute infarctions in the left frontal cortex. Chronic small-vessel ischemic change elsewhere. Old infarction right caudate head and adjacent white matter. SLE and BSE requested.   Assessment / Plan / Recommendation Clinical Impression  Clinical swallow evaluation completed despite Pt passing [facility] swallow screen as Pt was noted to pocket some on the left and experienced labial spillage on the left (OT and SLP) so BSE ordered. Oral motor examination reveals moderate left facial asymmetry and weakness (CN VII), however she does achieve some movement. Pt with labial spillage with cup sips of liquids (improved with straw sips) on the left side with reduced but emerging awareness of the same. Pt with mildly prolonged oral phase with solids likely due to left facial weakness. She does not show signs or symptoms of aspiration despite reduced oral control from weakness. Recommend D3/mech soft with thin liquids (may prefer to use a straw at this time) with aspiration and reflux precautions with intermittent supervision, ok for po medications whole with water; po when Pt is alert and upright. SLP will follow for SLE (dysarthria and reduced awareness).   SLP Visit Diagnosis: Dysphagia, oral phase (R13.11)    Aspiration Risk  Mild aspiration risk    Diet Recommendation Dysphagia 3 (Mech soft);Thin liquid   Liquid Administration via: Cup;Straw Medication Administration: Whole meds with liquid Supervision: Patient able to self feed;Intermittent supervision to cue for compensatory strategies Compensations: Lingual sweep for clearance of pocketing;Monitor for anterior loss Postural Changes: Seated upright at 90 degrees;Remain upright for at  least 30 minutes after po intake    Other  Recommendations Oral Care Recommendations: Oral care BID;Staff/trained caregiver to provide oral care Other Recommendations: Clarify dietary restrictions   Follow up Recommendations Skilled Nursing facility;Home health SLP      Frequency and Duration min 2x/week  1 week       Prognosis Prognosis for Safe Diet Advancement: Good      Swallow Study   General Date of Onset: 07/24/20 HPI: SHELLI PORTILLA is a 86 y.o. 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  The patient woke up around 8 AM in the morning of 07/24/2020 and she was normal at that time.  The patient was sitting on her couch trying to get up to the kitchen when she had difficulty getting up and noted the left-sided weakness.  The patient denied any visual disturbance, headache, dysarthria, or dysesthesias.  She denied any fevers, chills, chest pain, shortness of breath, coughing, hemoptysis, nausea, vomiting, diarrhea, abdominal pain, dysuria, hematuria. MRI shows acute infarction affecting the right external capsule and corona radiata. No hemorrhage or mass effect. Two punctate acute infarctions in the left frontal cortex. Chronic small-vessel ischemic change elsewhere. Old infarction right caudate head and adjacent white matter. SLE and BSE requested. Type of Study: Bedside Swallow Evaluation Previous Swallow Assessment: None on record Diet Prior to this Study: Regular;Thin liquids Temperature Spikes Noted: No Respiratory Status:  Room air History of Recent Intubation: No Behavior/Cognition: Alert;Cooperative;Pleasant mood Oral Cavity Assessment: Within Functional Limits Oral Care Completed by SLP: Yes Oral Cavity - Dentition: Adequate natural dentition Vision: Functional for self-feeding Self-Feeding Abilities: Able to feed  self Patient Positioning: Upright in bed Baseline Vocal Quality: Normal Volitional Cough: Weak Volitional Swallow: Able to elicit    Oral/Motor/Sensory Function Overall Oral Motor/Sensory Function: Moderate impairment Facial ROM: Reduced left;Suspected CN VII (facial) dysfunction Facial Symmetry: Abnormal symmetry left;Suspected CN VII (facial) dysfunction Facial Strength: Reduced left;Suspected CN VII (facial) dysfunction Lingual ROM: Within Functional Limits Lingual Symmetry: Within Functional Limits Lingual Strength: Within Functional Limits Velum: Within Functional Limits Mandible: Within Functional Limits   Ice Chips Ice chips: Within functional limits Presentation: Spoon   Thin Liquid Thin Liquid: Impaired Presentation: Cup;Self Fed;Straw Oral Phase Impairments: Reduced labial seal (labial spillage on the left) Oral Phase Functional Implications: Left anterior spillage    Nectar Thick Nectar Thick Liquid: Not tested   Honey Thick Honey Thick Liquid: Not tested   Puree Puree: Within functional limits Presentation: Self Fed;Spoon   Solid     Solid: Impaired Presentation: Self Fed Oral Phase Impairments: Reduced labial seal Oral Phase Functional Implications: Prolonged oral transit     Thank you,  Havery Moros, CCC-SLP [phone]  Kevin Mario 07/25/2020,10:32 AM

## 2020-07-26 DIAGNOSIS — I639 Cerebral infarction, unspecified: Secondary | ICD-10-CM | POA: Diagnosis not present

## 2020-07-26 DIAGNOSIS — E785 Hyperlipidemia, unspecified: Secondary | ICD-10-CM | POA: Diagnosis not present

## 2020-07-26 DIAGNOSIS — M1990 Unspecified osteoarthritis, unspecified site: Secondary | ICD-10-CM | POA: Diagnosis not present

## 2020-07-26 DIAGNOSIS — I5032 Chronic diastolic (congestive) heart failure: Secondary | ICD-10-CM | POA: Diagnosis not present

## 2020-07-26 DIAGNOSIS — I350 Nonrheumatic aortic (valve) stenosis: Secondary | ICD-10-CM | POA: Diagnosis not present

## 2020-07-26 DIAGNOSIS — I69391 Dysphagia following cerebral infarction: Secondary | ICD-10-CM | POA: Diagnosis not present

## 2020-07-26 DIAGNOSIS — I480 Paroxysmal atrial fibrillation: Secondary | ICD-10-CM | POA: Diagnosis not present

## 2020-07-26 DIAGNOSIS — I10 Essential (primary) hypertension: Secondary | ICD-10-CM | POA: Diagnosis not present

## 2020-07-26 DIAGNOSIS — R630 Anorexia: Secondary | ICD-10-CM | POA: Diagnosis not present

## 2020-07-26 DIAGNOSIS — Z7401 Bed confinement status: Secondary | ICD-10-CM | POA: Diagnosis not present

## 2020-07-26 DIAGNOSIS — R5381 Other malaise: Secondary | ICD-10-CM | POA: Diagnosis not present

## 2020-07-26 DIAGNOSIS — I69354 Hemiplegia and hemiparesis following cerebral infarction affecting left non-dominant side: Secondary | ICD-10-CM | POA: Diagnosis not present

## 2020-07-26 DIAGNOSIS — E7800 Pure hypercholesterolemia, unspecified: Secondary | ICD-10-CM | POA: Diagnosis not present

## 2020-07-26 DIAGNOSIS — G8194 Hemiplegia, unspecified affecting left nondominant side: Secondary | ICD-10-CM | POA: Diagnosis not present

## 2020-07-26 DIAGNOSIS — I69328 Other speech and language deficits following cerebral infarction: Secondary | ICD-10-CM | POA: Diagnosis not present

## 2020-07-26 DIAGNOSIS — E876 Hypokalemia: Secondary | ICD-10-CM | POA: Diagnosis not present

## 2020-07-26 DIAGNOSIS — R69 Illness, unspecified: Secondary | ICD-10-CM | POA: Diagnosis not present

## 2020-07-26 DIAGNOSIS — I5030 Unspecified diastolic (congestive) heart failure: Secondary | ICD-10-CM | POA: Diagnosis not present

## 2020-07-26 DIAGNOSIS — I129 Hypertensive chronic kidney disease with stage 1 through stage 4 chronic kidney disease, or unspecified chronic kidney disease: Secondary | ICD-10-CM | POA: Diagnosis not present

## 2020-07-26 DIAGNOSIS — I2510 Atherosclerotic heart disease of native coronary artery without angina pectoris: Secondary | ICD-10-CM | POA: Diagnosis not present

## 2020-07-26 DIAGNOSIS — N1832 Chronic kidney disease, stage 3b: Secondary | ICD-10-CM | POA: Diagnosis not present

## 2020-07-26 MED ORDER — POTASSIUM CHLORIDE CRYS ER 20 MEQ PO TBCR
40.0000 meq | EXTENDED_RELEASE_TABLET | Freq: Once | ORAL | Status: AC
  Administered 2020-07-26: 40 meq via ORAL
  Filled 2020-07-26: qty 2

## 2020-07-26 MED ORDER — ASPIRIN 81 MG PO TBEC
81.0000 mg | DELAYED_RELEASE_TABLET | Freq: Every day | ORAL | 0 refills | Status: DC
Start: 2020-07-27 — End: 2020-08-01

## 2020-07-26 NOTE — Discharge Summary (Addendum)
Physician Discharge Summary  Catherine Anthony BOF:[id number] DOB: September 14, 1930 DOA: 07/24/2020  PCP: [name], MD  Admit date: 07/24/2020 Discharge date: 07/26/2020  Admitted From: Home Disposition:  SNF  Recommendations for Outpatient Follow-up:  1. Follow up with PCP in 1-2 weeks 2. Please obtain BMP/CBC in one week   Discharge Condition: Stable CODE STATUS: FULL Diet recommendation: Heart Healthy--dysphagia 3 with thin liquid   Brief/Interim Summary: 86 y.o.femalewith medical history ofhypertension, paroxysmal atrial fibrillation,s/p TAVR July 2020, hyperlipidemia, diastolic CHF presenting with left-sided weakness that began around 10:30 AM on 07/24/2020. The patient woke up around 8 AM in the morning of 07/24/2020 and she was normal at that time. The patient was sitting on her couch trying to get up to the kitchen when she had difficulty getting up and noted the left-sided weakness. The patient denied any visual disturbance, headache, dysarthria, or dysesthesias. She denied any fevers, chills, chest pain, shortness of breath, coughing, hemoptysis, nausea, vomiting, diarrhea, abdominal pain, dysuria, hematuria. EMS was activated, and code stroke was initiated. In the emergency department, the patient was afebrile hemodynamically stable with oxygen saturation 99% on room air. BMP was unremarkable except for potassium 2.4. LFTs were unremarkable. CBC and LFTs were unremarkable. CT of the brain was negative for any acute findings but showed an age-indeterminate infarct in the right cerebellum. CT brain perfusion was negative for LVO. There was no evidence of core infarct or territory at risk. CT head and neck showed 50% stenosis of the right ICA and less than 50% stenosis of the left ICA. Neurology was consulted to assist with management.  Discharge Diagnoses:  Acute Ischemic/Embolic stroke -Concerned about right MCA stroke -Neurology Consulted -PT/OT  evaluation-->SNF -Speech therapy eval-->dys 3 with thin liquids -CT brain--neg for acute finding; age indeterminant R-cerebellar infarct -MRI brain--Acute infarction affecting the right external capsule and corona radiata;  2 punctate acute infarctions left frontal cortex -MRA brain--no LVO -CTA H&N--50% R-ICA stenosis; <50% L-ICA stenosis -Echo--EF 65-70%, no source of thrombi -LDL--42 -HbA1C--5.1 -Antiplatelet--ASA 81 mg x 1 week+apixaban -restarted Eliquis per neurology  Essential hypertension -Continue lower dose metoprolol -Holding hydralazine, lisinopril to allow for permissive hypertension -hydralazine prn SBP >220 -restart antihypertensive regimen at time of d/c  Hyperlipidemia -Continue Crestor -Check lipid panel--LDL 42  CKD stage IIIb -Baseline creatinine 1.0-1.3 -A.m. BMP  Paroxysmal atrial fibrillation -Continue lower dose metoprolol -currently in sinus -Holding apixaban pending neurology evaluation -Restart apixaban once cleared by neurology>>ok to restart -Continue amiodarone  Status postTAVR -echo--aortic valve was not well visualized  Hypokalemia -Replete -Check magnesium--2.3   Discharge Instructions   Allergies as of 07/26/2020      Reactions   Codeine Nausea And Vomiting      Medication List    TAKE these medications   amiodarone 200 MG tablet Commonly known as: PACERONE Take 200 mg by mouth as directed. Monday and Friday   aspirin 81 MG EC tablet Take 1 tablet (81 mg total) by mouth daily. Swallow whole. X 6 more days only, then discontinue Start taking on: July 27, 2020   Eliquis 2.5 MG Tabs tablet Generic drug: apixaban TAKE (1) TABLET BY MOUTH TWICE DAILY.   furosemide 20 MG tablet Commonly known as: LASIX Take 1 tablet (20 mg total) by mouth daily.   hydrALAZINE 25 MG tablet Commonly known as: APRESOLINE Take 1 tablet (25 mg total) by mouth 3 (three) times daily.   lisinopril 40 MG tablet Commonly known as:  ZESTRIL Take 40 mg by mouth daily.  metoprolol tartrate 25 MG tablet Commonly known as: LOPRESSOR TAKE (1) TABLET BY MOUTH TWICE DAILY.   mirtazapine 15 MG tablet Commonly known as: REMERON Take 7.5-15 mg by mouth at bedtime.   nitroGLYCERIN 0.4 MG SL tablet Commonly known as: NITROSTAT Place 0.4 mg under the tongue every 5 (five) minutes x 3 doses as needed for chest pain.   rosuvastatin 20 MG tablet Commonly known as: CRESTOR TAKE (1) TABLET BY MOUTH ONCE DAILY.   VITAMIN D (CHOLECALCIFEROL) PO Take 1 tablet by mouth daily.       Allergies  Allergen Reactions  . Codeine Nausea And Vomiting    Consultations:  neurology   Procedures/Studies: CT Angio Head W or Wo Contrast  Result Date: 07/24/2020 CLINICAL DATA:  Left-sided weakness EXAM: CT ANGIOGRAPHY HEAD AND NECK CT PERFUSION BRAIN TECHNIQUE: Multidetector CT imaging of the head and neck was performed using the standard protocol during bolus administration of intravenous contrast. Multiplanar CT image reconstructions and MIPs were obtained to evaluate the vascular anatomy. Carotid stenosis measurements (when applicable) are obtained utilizing NASCET criteria, using the distal internal carotid diameter as the denominator. Multiphase CT imaging of the brain was performed following IV bolus contrast injection. Subsequent parametric perfusion maps were calculated using RAPID software. CONTRAST:  40mL OMNIPAQUE IOHEXOL 350 MG/ML SOLN, 60mL OMNIPAQUE IOHEXOL 350 MG/ML SOLN COMPARISON:  None. FINDINGS: CTA NECK FINDINGS Aortic arch: Great vessel origins are patent with calcified plaque present. Right carotid system: Patent. Mixed but primarily calcified plaque at the ICA origin causing approximately 50% stenosis. Left carotid system: Patent. Mixed but primarily calcified plaque at the ICA origin causing less than 50% stenosis. Vertebral arteries: Patent. Right vertebral artery is dominant. There is calcified plaque at the left  vertebral origin with potential significant stenosis. Skeleton: Degenerative changes of the cervical spine. Other neck: No mass or adenopathy. Upper chest: No apical lung mass Review of the MIP images confirms the above findings CTA HEAD FINDINGS Anterior circulation: Intracranial internal carotid arteries are patent with mild calcified plaque. Anterior and middle cerebral arteries are patent. Right A1 ACA is dominant. Mild stenosis of the left M1 MCA. Posterior circulation: Intracranial vertebral arteries are patent with mild atherosclerotic irregularity. Patent PICA origins. Basilar artery is patent. Posterior cerebral arteries are patent. Large bilateral posterior communicating arteries. Venous sinuses: As permitted by contrast timing, patent. Review of the MIP images confirms the above findings CT Brain Perfusion Findings: CBF (<30%) Volume: 0mL Perfusion (Tmax>6.0s) volume: 0mL Mismatch Volume: 0mL Infarction Location:None. IMPRESSION: No large vessel occlusion. Perfusion imaging demonstrates no evidence of core infarction or territory at risk. Primarily calcified plaque at the ICA origins causing approximately 50% stenosis on the right and less than 50% stenosis on the left. Mild stenosis of the intracranial carotids and left M1 MCA. Electronically Signed   By: Guadlupe Spanish M.D.   On: 07/24/2020 12:49   DG Chest 2 View  Result Date: 07/24/2020 CLINICAL DATA:  Stroke, potential discharge EXAM: CHEST - 2 VIEW COMPARISON:  06/06/2019 FINDINGS: Heart is normal size. Mild hyperinflation/COPD. No confluent opacities or effusions. Prior aortic valve repair. No acute bony abnormality. IMPRESSION: Hyperinflation/COPD.  No active disease. Electronically Signed   By: Charlett Nose M.D.   On: 07/24/2020 19:41   CT Angio Neck W and/or Wo Contrast  Result Date: 07/24/2020 CLINICAL DATA:  Left-sided weakness EXAM: CT ANGIOGRAPHY HEAD AND NECK CT PERFUSION BRAIN TECHNIQUE: Multidetector CT imaging of the head and  neck was performed using the standard protocol during  bolus administration of intravenous contrast. Multiplanar CT image reconstructions and MIPs were obtained to evaluate the vascular anatomy. Carotid stenosis measurements (when applicable) are obtained utilizing NASCET criteria, using the distal internal carotid diameter as the denominator. Multiphase CT imaging of the brain was performed following IV bolus contrast injection. Subsequent parametric perfusion maps were calculated using RAPID software. CONTRAST:  40mL OMNIPAQUE IOHEXOL 350 MG/ML SOLN, 60mL OMNIPAQUE IOHEXOL 350 MG/ML SOLN COMPARISON:  None. FINDINGS: CTA NECK FINDINGS Aortic arch: Great vessel origins are patent with calcified plaque present. Right carotid system: Patent. Mixed but primarily calcified plaque at the ICA origin causing approximately 50% stenosis. Left carotid system: Patent. Mixed but primarily calcified plaque at the ICA origin causing less than 50% stenosis. Vertebral arteries: Patent. Right vertebral artery is dominant. There is calcified plaque at the left vertebral origin with potential significant stenosis. Skeleton: Degenerative changes of the cervical spine. Other neck: No mass or adenopathy. Upper chest: No apical lung mass Review of the MIP images confirms the above findings CTA HEAD FINDINGS Anterior circulation: Intracranial internal carotid arteries are patent with mild calcified plaque. Anterior and middle cerebral arteries are patent. Right A1 ACA is dominant. Mild stenosis of the left M1 MCA. Posterior circulation: Intracranial vertebral arteries are patent with mild atherosclerotic irregularity. Patent PICA origins. Basilar artery is patent. Posterior cerebral arteries are patent. Large bilateral posterior communicating arteries. Venous sinuses: As permitted by contrast timing, patent. Review of the MIP images confirms the above findings CT Brain Perfusion Findings: CBF (<30%) Volume: 0mL Perfusion (Tmax>6.0s) volume:  0mL Mismatch Volume: 0mL Infarction Location:None. IMPRESSION: No large vessel occlusion. Perfusion imaging demonstrates no evidence of core infarction or territory at risk. Primarily calcified plaque at the ICA origins causing approximately 50% stenosis on the right and less than 50% stenosis on the left. Mild stenosis of the intracranial carotids and left M1 MCA. Electronically Signed   By: Guadlupe Spanish M.D.   On: 07/24/2020 12:49   MR ANGIO HEAD WO CONTRAST  Result Date: 07/24/2020 CLINICAL DATA:  Left-sided weakness noticed today. EXAM: MRI HEAD WITHOUT CONTRAST MRA HEAD WITHOUT CONTRAST TECHNIQUE: Multiplanar, multiecho pulse sequences of the brain and surrounding structures were obtained without intravenous contrast. Angiographic images of the head were obtained using MRA technique without contrast. COMPARISON:  CT studies same day. FINDINGS: MRI HEAD FINDINGS Brain: Diffusion imaging shows acute infarction on the right affecting the external capsule and corona radiata region. No hemorrhage or mass effect. 2 acute punctate infarctions in the left frontal cortex region. Old small vessel cerebellar infarctions. Old small vessel infarction right pons. Wallerian degeneration on the right. Old infarction of the right caudate head and adjacent white matter tracks per moderate chronic small-vessel ischemic changes of the white matter elsewhere. No cortical or large vessel territory infarction. No sign of hemorrhage, hydrocephalus or extra-axial collection. Vascular: Major vessels at the base of the brain show flow. Skull and upper cervical spine: Negative Sinuses/Orbits: Clear/normal Other: Small left mastoid effusion. MRA HEAD FINDINGS Some motion degradation. Both internal carotid arteries are patent through the skull base and siphon regions. Both anterior and middle cerebral arteries are patent. Distal vessels not accurately evaluated because of motion. Right vertebral artery is widely patent to the basilar.  Question abnormal slow or retrograde flow in the left vertebral artery. No basilar stenosis. Flow is present in the superior cerebellar and posterior cerebral arteries. Primary anterior circulation supply of the posterior cerebral arteries. Again, distal vessel detail is compromised by motion. IMPRESSION: Acute infarction  affecting the right external capsule and corona radiata. No hemorrhage or mass effect. Two punctate acute infarctions in the left frontal cortex. Chronic small-vessel ischemic change elsewhere. Old infarction right caudate head and adjacent white matter. Motion degraded intracranial MR angiography does not show any large or medium vessel occlusion. Apparent slow or retrograde flow in the distal left vertebral artery, not confidently established because of motion. Electronically Signed   By: Paulina Fusi M.D.   On: 07/24/2020 15:23   MR BRAIN WO CONTRAST  Result Date: 07/24/2020 CLINICAL DATA:  Left-sided weakness noticed today. EXAM: MRI HEAD WITHOUT CONTRAST MRA HEAD WITHOUT CONTRAST TECHNIQUE: Multiplanar, multiecho pulse sequences of the brain and surrounding structures were obtained without intravenous contrast. Angiographic images of the head were obtained using MRA technique without contrast. COMPARISON:  CT studies same day. FINDINGS: MRI HEAD FINDINGS Brain: Diffusion imaging shows acute infarction on the right affecting the external capsule and corona radiata region. No hemorrhage or mass effect. 2 acute punctate infarctions in the left frontal cortex region. Old small vessel cerebellar infarctions. Old small vessel infarction right pons. Wallerian degeneration on the right. Old infarction of the right caudate head and adjacent white matter tracks per moderate chronic small-vessel ischemic changes of the white matter elsewhere. No cortical or large vessel territory infarction. No sign of hemorrhage, hydrocephalus or extra-axial collection. Vascular: Major vessels at the base of the  brain show flow. Skull and upper cervical spine: Negative Sinuses/Orbits: Clear/normal Other: Small left mastoid effusion. MRA HEAD FINDINGS Some motion degradation. Both internal carotid arteries are patent through the skull base and siphon regions. Both anterior and middle cerebral arteries are patent. Distal vessels not accurately evaluated because of motion. Right vertebral artery is widely patent to the basilar. Question abnormal slow or retrograde flow in the left vertebral artery. No basilar stenosis. Flow is present in the superior cerebellar and posterior cerebral arteries. Primary anterior circulation supply of the posterior cerebral arteries. Again, distal vessel detail is compromised by motion. IMPRESSION: Acute infarction affecting the right external capsule and corona radiata. No hemorrhage or mass effect. Two punctate acute infarctions in the left frontal cortex. Chronic small-vessel ischemic change elsewhere. Old infarction right caudate head and adjacent white matter. Motion degraded intracranial MR angiography does not show any large or medium vessel occlusion. Apparent slow or retrograde flow in the distal left vertebral artery, not confidently established because of motion. Electronically Signed   By: Paulina Fusi M.D.   On: 07/24/2020 15:23   CT CEREBRAL PERFUSION W CONTRAST  Result Date: 07/24/2020 CLINICAL DATA:  Left-sided weakness EXAM: CT ANGIOGRAPHY HEAD AND NECK CT PERFUSION BRAIN TECHNIQUE: Multidetector CT imaging of the head and neck was performed using the standard protocol during bolus administration of intravenous contrast. Multiplanar CT image reconstructions and MIPs were obtained to evaluate the vascular anatomy. Carotid stenosis measurements (when applicable) are obtained utilizing NASCET criteria, using the distal internal carotid diameter as the denominator. Multiphase CT imaging of the brain was performed following IV bolus contrast injection. Subsequent parametric  perfusion maps were calculated using RAPID software. CONTRAST:  40mL OMNIPAQUE IOHEXOL 350 MG/ML SOLN, 60mL OMNIPAQUE IOHEXOL 350 MG/ML SOLN COMPARISON:  None. FINDINGS: CTA NECK FINDINGS Aortic arch: Great vessel origins are patent with calcified plaque present. Right carotid system: Patent. Mixed but primarily calcified plaque at the ICA origin causing approximately 50% stenosis. Left carotid system: Patent. Mixed but primarily calcified plaque at the ICA origin causing less than 50% stenosis. Vertebral arteries: Patent. Right vertebral artery  is dominant. There is calcified plaque at the left vertebral origin with potential significant stenosis. Skeleton: Degenerative changes of the cervical spine. Other neck: No mass or adenopathy. Upper chest: No apical lung mass Review of the MIP images confirms the above findings CTA HEAD FINDINGS Anterior circulation: Intracranial internal carotid arteries are patent with mild calcified plaque. Anterior and middle cerebral arteries are patent. Right A1 ACA is dominant. Mild stenosis of the left M1 MCA. Posterior circulation: Intracranial vertebral arteries are patent with mild atherosclerotic irregularity. Patent PICA origins. Basilar artery is patent. Posterior cerebral arteries are patent. Large bilateral posterior communicating arteries. Venous sinuses: As permitted by contrast timing, patent. Review of the MIP images confirms the above findings CT Brain Perfusion Findings: CBF (<30%) Volume: 0mL Perfusion (Tmax>6.0s) volume: 0mL Mismatch Volume: 0mL Infarction Location:None. IMPRESSION: No large vessel occlusion. Perfusion imaging demonstrates no evidence of core infarction or territory at risk. Primarily calcified plaque at the ICA origins causing approximately 50% stenosis on the right and less than 50% stenosis on the left. Mild stenosis of the intracranial carotids and left M1 MCA. Electronically Signed   By: Guadlupe Spanish M.D.   On: 07/24/2020 12:49    ECHOCARDIOGRAM COMPLETE  Result Date: 07/25/2020    ECHOCARDIOGRAM REPORT   Patient Name:   Catherine Anthony Date of Exam: 07/25/2020 Medical Rec #:  [id number]       Height:       64.0 in Accession #:    [id number]      Weight:       92.4 lb Date of Birth:  1930-09-09       BSA:          1.409 m Patient Age:    86 years        BP:           167/74 mmHg Patient Gender: F               HR:           60 bpm. [facility] Procedure: 2D Echo Indications:    stroke 434.91  History:        Patient has prior history of Echocardiogram examinations, most                 recent 05/09/2020. CAD; Risk Factors:Hypertension and                 Dyslipidemia. S/p TAVR. PAF. Hx of severe AS.  Sonographer:    Celene Skeen RDCS (AE) Referring Phys: [phone] Larry Knipp  Sonographer Comments: Suboptimal parasternal window. IMPRESSIONS  1. Left ventricular ejection fraction, by estimation, is 65 to 70%. The left ventricle has normal function. The left ventricle has no regional wall motion abnormalities. Left ventricular diastolic parameters are consistent with Grade I diastolic dysfunction (impaired relaxation). Elevated left atrial pressure.  2. Right ventricular systolic function was not well visualized. The right ventricular size is not well visualized. There is mildly elevated pulmonary artery systolic pressure.  3. The mitral valve is normal in structure. Mild mitral valve regurgitation. No evidence of mitral stenosis.  4. Edwards Sapien 3 Ultra THV size 23 mm. The aortic valve was not well visualized. Aortic valve regurgitation is not visualized. No aortic stenosis is present. FINDINGS  Left Ventricle: Left ventricular ejection fraction, by estimation, is 65 to 70%. The left ventricle has normal function. The left ventricle has no regional wall motion abnormalities. The left ventricular internal cavity size was normal  in size. There is  no left ventricular hypertrophy. Left ventricular diastolic parameters are  consistent with Grade I diastolic dysfunction (impaired relaxation). Elevated left atrial pressure. Right Ventricle: The right ventricular size is not well visualized. Right vetricular wall thickness was not assessed. Right ventricular systolic function was not well visualized. There is mildly elevated pulmonary artery systolic pressure. The tricuspid regurgitant velocity is 2.92 m/s, and with an assumed right atrial pressure of 8 mmHg, the estimated right ventricular systolic pressure is 42.1 mmHg. Left Atrium: Left atrial size was normal in size. Right Atrium: Right atrial size was not well visualized. Pericardium: There is no evidence of pericardial effusion. Mitral Valve: The mitral valve is normal in structure. Moderate mitral annular calcification. Mild mitral valve regurgitation. No evidence of mitral valve stenosis. Tricuspid Valve: The tricuspid valve is not well visualized. Tricuspid valve regurgitation is not demonstrated. No evidence of tricuspid stenosis. Aortic Valve: Edwards Sapien 3 Ultra THV size 23 mm. The aortic valve was not well visualized. Aortic valve regurgitation is not visualized. No aortic stenosis is present. Aortic valve mean gradient measures 5.2 mmHg. Aortic valve peak gradient measures 8.6 mmHg. Aortic valve area, by VTI measures 1.77 cm. Pulmonic Valve: The pulmonic valve was not well visualized. Pulmonic valve regurgitation is not visualized. No evidence of pulmonic stenosis. Aorta: The aortic root is normal in size and structure. Pulmonary Artery: Indeterminant PASP, IVC poorly visualized. Venous: The inferior vena cava was not well visualized. IAS/Shunts: The interatrial septum was not well visualized.  LEFT VENTRICLE PLAX 2D LVIDd:         3.40 cm  Diastology LVIDs:         2.10 cm  LV e' lateral:   5.87 cm/s LV PW:         0.88 cm  LV E/e' lateral: 15.8 LV IVS:        1.12 cm  LV e' medial:    4.79 cm/s LVOT diam:     1.80 cm  LV E/e' medial:  19.3 LV SV:         59 LV SV  Index:   42 LVOT Area:     2.54 cm  LEFT ATRIUM             Index LA diam:        2.50 cm 1.77 cm/m LA Vol (A2C):   16.8 ml 11.92 ml/m LA Vol (A4C):   28.8 ml 20.44 ml/m LA Biplane Vol: 24.4 ml 17.32 ml/m  AORTIC VALVE AV Area (Vmax):    1.75 cm AV Area (Vmean):   1.59 cm AV Area (VTI):     1.77 cm AV Vmax:           146.60 cm/s AV Vmean:          108.005 cm/s AV VTI:            0.331 m AV Peak Grad:      8.6 mmHg AV Mean Grad:      5.2 mmHg LVOT Vmax:         101.00 cm/s LVOT Vmean:        67.500 cm/s LVOT VTI:          0.230 m LVOT/AV VTI ratio: 0.70  AORTA Ao Root diam: 2.50 cm MITRAL VALVE                TRICUSPID VALVE MV Area (PHT): 1.63 cm     TR Peak grad:   34.1 mmHg MV Decel  Time: 466 msec     TR Vmax:        292.00 cm/s MV E velocity: 92.60 cm/s MV A velocity: 133.00 cm/s  SHUNTS MV E/A ratio:  0.70         Systemic VTI:  0.23 m                             Systemic Diam: 1.80 cm Dina Rich MD Electronically signed by Dina Rich MD Signature Date/Time: 07/25/2020/1:07:29 PM    Final    CT HEAD CODE STROKE WO CONTRAST  Result Date: 07/24/2020 CLINICAL DATA:  Code stroke.  Left-sided weakness EXAM: CT HEAD WITHOUT CONTRAST TECHNIQUE: Contiguous axial images were obtained from the base of the skull through the vertex without intravenous contrast. COMPARISON:  None. FINDINGS: Brain: There is no acute intracranial hemorrhage, mass effect, or edema. There is no acute appearing loss of gray-white differentiation. Prominence of the ventricles and sulci reflects generalized parenchymal volume loss. There is ex vacuo dilatation of the frontal horn of the right lateral ventricle contiguous with an infarct. Patchy and confluent areas of hypoattenuation in the supratentorial white matter are nonspecific but probably reflect moderate chronic microvascular ischemic changes. There are age-indeterminate small vessel infarcts of the right cerebellum. Vascular: Mild focal hyperdensity along a proximal  right M2 MCA branch. There is intracranial atherosclerotic calcification at the skull base. Skull: Unremarkable. Sinuses/Orbits: No acute finding. Other: Mastoid air cells are clear. ASPECTS ([place] Stroke Program Early CT Score) - Ganglionic level infarction (caudate, lentiform nuclei, internal capsule, insula, M1-M3 cortex): 7 - Supraganglionic infarction (M4-M6 cortex): 3 Total score (0-10 with 10 being normal): 10 IMPRESSION: No acute intracranial hemorrhage or evidence of acute infarction. ASPECT score is 10. Age-indeterminate small vessel infarcts of the right cerebellum. Focus of mild hyperdensity along a proximal right M2 MCA branch may be artifactual. Correlation will be made with forthcoming CT vascular imaging. These results were called by telephone at the time of interpretation on 07/24/2020 at 12:22 pm to provider [facility] , who verbally acknowledged these results. Electronically Signed   By: Guadlupe Spanish M.D.   On: 07/24/2020 12:27        Discharge Exam: Vitals:   07/26/20 0917 07/26/20 0920  BP: (!) 167/106 (!) 167/106  Pulse: 93 (!) 102  Resp:  16  Temp:  99.1 F (37.3 C)  SpO2:  99%   Vitals:   07/25/20 1522 07/25/20 2132 07/26/20 0917 07/26/20 0920  BP: (!) 166/74 (!) 147/68 (!) 167/106 (!) 167/106  Pulse: 67 73 93 (!) 102  Resp: 18 17  16   Temp: 97.8 F (36.6 C) 98 F (36.7 C)  99.1 F (37.3 C)  TempSrc: Oral Oral  Oral  SpO2: 98% 99%  99%  Weight:        General: Pt is alert, awake, not in acute distress Cardiovascular: RRR, S1/S2 +, no rubs, no gallops Respiratory: CTA bilaterally, no wheezing, no rhonchi Abdominal: Soft, NT, ND, bowel sounds + Extremities: no edema, no cyanosis   The results of significant diagnostics from this hospitalization (including imaging, microbiology, ancillary and laboratory) are listed below for reference.    Significant Diagnostic Studies: CT Angio Head W or Wo Contrast  Result Date: 07/24/2020 CLINICAL DATA:   Left-sided weakness EXAM: CT ANGIOGRAPHY HEAD AND NECK CT PERFUSION BRAIN TECHNIQUE: Multidetector CT imaging of the head and neck was performed using the standard protocol during bolus administration of intravenous  contrast. Multiplanar CT image reconstructions and MIPs were obtained to evaluate the vascular anatomy. Carotid stenosis measurements (when applicable) are obtained utilizing NASCET criteria, using the distal internal carotid diameter as the denominator. Multiphase CT imaging of the brain was performed following IV bolus contrast injection. Subsequent parametric perfusion maps were calculated using RAPID software. CONTRAST:  40mL OMNIPAQUE IOHEXOL 350 MG/ML SOLN, 60mL OMNIPAQUE IOHEXOL 350 MG/ML SOLN COMPARISON:  None. FINDINGS: CTA NECK FINDINGS Aortic arch: Great vessel origins are patent with calcified plaque present. Right carotid system: Patent. Mixed but primarily calcified plaque at the ICA origin causing approximately 50% stenosis. Left carotid system: Patent. Mixed but primarily calcified plaque at the ICA origin causing less than 50% stenosis. Vertebral arteries: Patent. Right vertebral artery is dominant. There is calcified plaque at the left vertebral origin with potential significant stenosis. Skeleton: Degenerative changes of the cervical spine. Other neck: No mass or adenopathy. Upper chest: No apical lung mass Review of the MIP images confirms the above findings CTA HEAD FINDINGS Anterior circulation: Intracranial internal carotid arteries are patent with mild calcified plaque. Anterior and middle cerebral arteries are patent. Right A1 ACA is dominant. Mild stenosis of the left M1 MCA. Posterior circulation: Intracranial vertebral arteries are patent with mild atherosclerotic irregularity. Patent PICA origins. Basilar artery is patent. Posterior cerebral arteries are patent. Large bilateral posterior communicating arteries. Venous sinuses: As permitted by contrast timing, patent. Review  of the MIP images confirms the above findings CT Brain Perfusion Findings: CBF (<30%) Volume: 0mL Perfusion (Tmax>6.0s) volume: 0mL Mismatch Volume: 0mL Infarction Location:None. IMPRESSION: No large vessel occlusion. Perfusion imaging demonstrates no evidence of core infarction or territory at risk. Primarily calcified plaque at the ICA origins causing approximately 50% stenosis on the right and less than 50% stenosis on the left. Mild stenosis of the intracranial carotids and left M1 MCA. Electronically Signed   By: Guadlupe SpanishPraneil  Patel M.D.   On: 07/24/2020 12:49   DG Chest 2 View  Result Date: 07/24/2020 CLINICAL DATA:  Stroke, potential discharge EXAM: CHEST - 2 VIEW COMPARISON:  06/06/2019 FINDINGS: Heart is normal size. Mild hyperinflation/COPD. No confluent opacities or effusions. Prior aortic valve repair. No acute bony abnormality. IMPRESSION: Hyperinflation/COPD.  No active disease. Electronically Signed   By: Charlett NoseKevin  Dover M.D.   On: 07/24/2020 19:41   CT Angio Neck W and/or Wo Contrast  Result Date: 07/24/2020 CLINICAL DATA:  Left-sided weakness EXAM: CT ANGIOGRAPHY HEAD AND NECK CT PERFUSION BRAIN TECHNIQUE: Multidetector CT imaging of the head and neck was performed using the standard protocol during bolus administration of intravenous contrast. Multiplanar CT image reconstructions and MIPs were obtained to evaluate the vascular anatomy. Carotid stenosis measurements (when applicable) are obtained utilizing NASCET criteria, using the distal internal carotid diameter as the denominator. Multiphase CT imaging of the brain was performed following IV bolus contrast injection. Subsequent parametric perfusion maps were calculated using RAPID software. CONTRAST:  40mL OMNIPAQUE IOHEXOL 350 MG/ML SOLN, 60mL OMNIPAQUE IOHEXOL 350 MG/ML SOLN COMPARISON:  None. FINDINGS: CTA NECK FINDINGS Aortic arch: Great vessel origins are patent with calcified plaque present. Right carotid system: Patent. Mixed but  primarily calcified plaque at the ICA origin causing approximately 50% stenosis. Left carotid system: Patent. Mixed but primarily calcified plaque at the ICA origin causing less than 50% stenosis. Vertebral arteries: Patent. Right vertebral artery is dominant. There is calcified plaque at the left vertebral origin with potential significant stenosis. Skeleton: Degenerative changes of the cervical spine. Other neck: No mass or adenopathy. Upper  chest: No apical lung mass Review of the MIP images confirms the above findings CTA HEAD FINDINGS Anterior circulation: Intracranial internal carotid arteries are patent with mild calcified plaque. Anterior and middle cerebral arteries are patent. Right A1 ACA is dominant. Mild stenosis of the left M1 MCA. Posterior circulation: Intracranial vertebral arteries are patent with mild atherosclerotic irregularity. Patent PICA origins. Basilar artery is patent. Posterior cerebral arteries are patent. Large bilateral posterior communicating arteries. Venous sinuses: As permitted by contrast timing, patent. Review of the MIP images confirms the above findings CT Brain Perfusion Findings: CBF (<30%) Volume: 0mL Perfusion (Tmax>6.0s) volume: 0mL Mismatch Volume: 0mL Infarction Location:None. IMPRESSION: No large vessel occlusion. Perfusion imaging demonstrates no evidence of core infarction or territory at risk. Primarily calcified plaque at the ICA origins causing approximately 50% stenosis on the right and less than 50% stenosis on the left. Mild stenosis of the intracranial carotids and left M1 MCA. Electronically Signed   By: Guadlupe Spanish M.D.   On: 07/24/2020 12:49   MR ANGIO HEAD WO CONTRAST  Result Date: 07/24/2020 CLINICAL DATA:  Left-sided weakness noticed today. EXAM: MRI HEAD WITHOUT CONTRAST MRA HEAD WITHOUT CONTRAST TECHNIQUE: Multiplanar, multiecho pulse sequences of the brain and surrounding structures were obtained without intravenous contrast. Angiographic images  of the head were obtained using MRA technique without contrast. COMPARISON:  CT studies same day. FINDINGS: MRI HEAD FINDINGS Brain: Diffusion imaging shows acute infarction on the right affecting the external capsule and corona radiata region. No hemorrhage or mass effect. 2 acute punctate infarctions in the left frontal cortex region. Old small vessel cerebellar infarctions. Old small vessel infarction right pons. Wallerian degeneration on the right. Old infarction of the right caudate head and adjacent white matter tracks per moderate chronic small-vessel ischemic changes of the white matter elsewhere. No cortical or large vessel territory infarction. No sign of hemorrhage, hydrocephalus or extra-axial collection. Vascular: Major vessels at the base of the brain show flow. Skull and upper cervical spine: Negative Sinuses/Orbits: Clear/normal Other: Small left mastoid effusion. MRA HEAD FINDINGS Some motion degradation. Both internal carotid arteries are patent through the skull base and siphon regions. Both anterior and middle cerebral arteries are patent. Distal vessels not accurately evaluated because of motion. Right vertebral artery is widely patent to the basilar. Question abnormal slow or retrograde flow in the left vertebral artery. No basilar stenosis. Flow is present in the superior cerebellar and posterior cerebral arteries. Primary anterior circulation supply of the posterior cerebral arteries. Again, distal vessel detail is compromised by motion. IMPRESSION: Acute infarction affecting the right external capsule and corona radiata. No hemorrhage or mass effect. Two punctate acute infarctions in the left frontal cortex. Chronic small-vessel ischemic change elsewhere. Old infarction right caudate head and adjacent white matter. Motion degraded intracranial MR angiography does not show any large or medium vessel occlusion. Apparent slow or retrograde flow in the distal left vertebral artery, not  confidently established because of motion. Electronically Signed   By: Paulina Fusi M.D.   On: 07/24/2020 15:23   MR BRAIN WO CONTRAST  Result Date: 07/24/2020 CLINICAL DATA:  Left-sided weakness noticed today. EXAM: MRI HEAD WITHOUT CONTRAST MRA HEAD WITHOUT CONTRAST TECHNIQUE: Multiplanar, multiecho pulse sequences of the brain and surrounding structures were obtained without intravenous contrast. Angiographic images of the head were obtained using MRA technique without contrast. COMPARISON:  CT studies same day. FINDINGS: MRI HEAD FINDINGS Brain: Diffusion imaging shows acute infarction on the right affecting the external capsule and corona radiata  region. No hemorrhage or mass effect. 2 acute punctate infarctions in the left frontal cortex region. Old small vessel cerebellar infarctions. Old small vessel infarction right pons. Wallerian degeneration on the right. Old infarction of the right caudate head and adjacent white matter tracks per moderate chronic small-vessel ischemic changes of the white matter elsewhere. No cortical or large vessel territory infarction. No sign of hemorrhage, hydrocephalus or extra-axial collection. Vascular: Major vessels at the base of the brain show flow. Skull and upper cervical spine: Negative Sinuses/Orbits: Clear/normal Other: Small left mastoid effusion. MRA HEAD FINDINGS Some motion degradation. Both internal carotid arteries are patent through the skull base and siphon regions. Both anterior and middle cerebral arteries are patent. Distal vessels not accurately evaluated because of motion. Right vertebral artery is widely patent to the basilar. Question abnormal slow or retrograde flow in the left vertebral artery. No basilar stenosis. Flow is present in the superior cerebellar and posterior cerebral arteries. Primary anterior circulation supply of the posterior cerebral arteries. Again, distal vessel detail is compromised by motion. IMPRESSION: Acute infarction  affecting the right external capsule and corona radiata. No hemorrhage or mass effect. Two punctate acute infarctions in the left frontal cortex. Chronic small-vessel ischemic change elsewhere. Old infarction right caudate head and adjacent white matter. Motion degraded intracranial MR angiography does not show any large or medium vessel occlusion. Apparent slow or retrograde flow in the distal left vertebral artery, not confidently established because of motion. Electronically Signed   By: Paulina Fusi M.D.   On: 07/24/2020 15:23   CT CEREBRAL PERFUSION W CONTRAST  Result Date: 07/24/2020 CLINICAL DATA:  Left-sided weakness EXAM: CT ANGIOGRAPHY HEAD AND NECK CT PERFUSION BRAIN TECHNIQUE: Multidetector CT imaging of the head and neck was performed using the standard protocol during bolus administration of intravenous contrast. Multiplanar CT image reconstructions and MIPs were obtained to evaluate the vascular anatomy. Carotid stenosis measurements (when applicable) are obtained utilizing NASCET criteria, using the distal internal carotid diameter as the denominator. Multiphase CT imaging of the brain was performed following IV bolus contrast injection. Subsequent parametric perfusion maps were calculated using RAPID software. CONTRAST:  40mL OMNIPAQUE IOHEXOL 350 MG/ML SOLN, 60mL OMNIPAQUE IOHEXOL 350 MG/ML SOLN COMPARISON:  None. FINDINGS: CTA NECK FINDINGS Aortic arch: Great vessel origins are patent with calcified plaque present. Right carotid system: Patent. Mixed but primarily calcified plaque at the ICA origin causing approximately 50% stenosis. Left carotid system: Patent. Mixed but primarily calcified plaque at the ICA origin causing less than 50% stenosis. Vertebral arteries: Patent. Right vertebral artery is dominant. There is calcified plaque at the left vertebral origin with potential significant stenosis. Skeleton: Degenerative changes of the cervical spine. Other neck: No mass or adenopathy. Upper  chest: No apical lung mass Review of the MIP images confirms the above findings CTA HEAD FINDINGS Anterior circulation: Intracranial internal carotid arteries are patent with mild calcified plaque. Anterior and middle cerebral arteries are patent. Right A1 ACA is dominant. Mild stenosis of the left M1 MCA. Posterior circulation: Intracranial vertebral arteries are patent with mild atherosclerotic irregularity. Patent PICA origins. Basilar artery is patent. Posterior cerebral arteries are patent. Large bilateral posterior communicating arteries. Venous sinuses: As permitted by contrast timing, patent. Review of the MIP images confirms the above findings CT Brain Perfusion Findings: CBF (<30%) Volume: 0mL Perfusion (Tmax>6.0s) volume: 0mL Mismatch Volume: 0mL Infarction Location:None. IMPRESSION: No large vessel occlusion. Perfusion imaging demonstrates no evidence of core infarction or territory at risk. Primarily calcified plaque at the  ICA origins causing approximately 50% stenosis on the right and less than 50% stenosis on the left. Mild stenosis of the intracranial carotids and left M1 MCA. Electronically Signed   By: Guadlupe Spanish M.D.   On: 07/24/2020 12:49   ECHOCARDIOGRAM COMPLETE  Result Date: 07/25/2020    ECHOCARDIOGRAM REPORT   Patient Name:   Catherine Anthony Date of Exam: 07/25/2020 Medical Rec #:  [id number]       Height:       64.0 in Accession #:    [id number]      Weight:       92.4 lb Date of Birth:  July 02, 1930       BSA:          1.409 m Patient Age:    86 years        BP:           167/74 mmHg Patient Gender: F               HR:           60 bpm. [facility] Procedure: 2D Echo Indications:    stroke 434.91  History:        Patient has prior history of Echocardiogram examinations, most                 recent 05/09/2020. CAD; Risk Factors:Hypertension and                 Dyslipidemia. S/p TAVR. PAF. Hx of severe AS.  Sonographer:    Celene Skeen RDCS (AE) Referring Phys: [phone] Javonne Dorko  Sonographer Comments: Suboptimal parasternal window. IMPRESSIONS  1. Left ventricular ejection fraction, by estimation, is 65 to 70%. The left ventricle has normal function. The left ventricle has no regional wall motion abnormalities. Left ventricular diastolic parameters are consistent with Grade I diastolic dysfunction (impaired relaxation). Elevated left atrial pressure.  2. Right ventricular systolic function was not well visualized. The right ventricular size is not well visualized. There is mildly elevated pulmonary artery systolic pressure.  3. The mitral valve is normal in structure. Mild mitral valve regurgitation. No evidence of mitral stenosis.  4. Edwards Sapien 3 Ultra THV size 23 mm. The aortic valve was not well visualized. Aortic valve regurgitation is not visualized. No aortic stenosis is present. FINDINGS  Left Ventricle: Left ventricular ejection fraction, by estimation, is 65 to 70%. The left ventricle has normal function. The left ventricle has no regional wall motion abnormalities. The left ventricular internal cavity size was normal in size. There is  no left ventricular hypertrophy. Left ventricular diastolic parameters are consistent with Grade I diastolic dysfunction (impaired relaxation). Elevated left atrial pressure. Right Ventricle: The right ventricular size is not well visualized. Right vetricular wall thickness was not assessed. Right ventricular systolic function was not well visualized. There is mildly elevated pulmonary artery systolic pressure. The tricuspid regurgitant velocity is 2.92 m/s, and with an assumed right atrial pressure of 8 mmHg, the estimated right ventricular systolic pressure is 42.1 mmHg. Left Atrium: Left atrial size was normal in size. Right Atrium: Right atrial size was not well visualized. Pericardium: There is no evidence of pericardial effusion. Mitral Valve: The mitral valve is normal in structure. Moderate mitral annular calcification. Mild mitral  valve regurgitation. No evidence of mitral valve stenosis. Tricuspid Valve: The tricuspid valve is not well visualized. Tricuspid valve regurgitation is not demonstrated. No evidence of tricuspid stenosis. Aortic Valve: Edwards Sapien 3 Ultra  THV size 23 mm. The aortic valve was not well visualized. Aortic valve regurgitation is not visualized. No aortic stenosis is present. Aortic valve mean gradient measures 5.2 mmHg. Aortic valve peak gradient measures 8.6 mmHg. Aortic valve area, by VTI measures 1.77 cm. Pulmonic Valve: The pulmonic valve was not well visualized. Pulmonic valve regurgitation is not visualized. No evidence of pulmonic stenosis. Aorta: The aortic root is normal in size and structure. Pulmonary Artery: Indeterminant PASP, IVC poorly visualized. Venous: The inferior vena cava was not well visualized. IAS/Shunts: The interatrial septum was not well visualized.  LEFT VENTRICLE PLAX 2D LVIDd:         3.40 cm  Diastology LVIDs:         2.10 cm  LV e' lateral:   5.87 cm/s LV PW:         0.88 cm  LV E/e' lateral: 15.8 LV IVS:        1.12 cm  LV e' medial:    4.79 cm/s LVOT diam:     1.80 cm  LV E/e' medial:  19.3 LV SV:         59 LV SV Index:   42 LVOT Area:     2.54 cm  LEFT ATRIUM             Index LA diam:        2.50 cm 1.77 cm/m LA Vol (A2C):   16.8 ml 11.92 ml/m LA Vol (A4C):   28.8 ml 20.44 ml/m LA Biplane Vol: 24.4 ml 17.32 ml/m  AORTIC VALVE AV Area (Vmax):    1.75 cm AV Area (Vmean):   1.59 cm AV Area (VTI):     1.77 cm AV Vmax:           146.60 cm/s AV Vmean:          108.005 cm/s AV VTI:            0.331 m AV Peak Grad:      8.6 mmHg AV Mean Grad:      5.2 mmHg LVOT Vmax:         101.00 cm/s LVOT Vmean:        67.500 cm/s LVOT VTI:          0.230 m LVOT/AV VTI ratio: 0.70  AORTA Ao Root diam: 2.50 cm MITRAL VALVE                TRICUSPID VALVE MV Area (PHT): 1.63 cm     TR Peak grad:   34.1 mmHg MV Decel Time: 466 msec     TR Vmax:        292.00 cm/s MV E velocity: 92.60 cm/s MV A  velocity: 133.00 cm/s  SHUNTS MV E/A ratio:  0.70         Systemic VTI:  0.23 m                             Systemic Diam: 1.80 cm Dina Rich MD Electronically signed by Dina Rich MD Signature Date/Time: 07/25/2020/1:07:29 PM    Final    CT HEAD CODE STROKE WO CONTRAST  Result Date: 07/24/2020 CLINICAL DATA:  Code stroke.  Left-sided weakness EXAM: CT HEAD WITHOUT CONTRAST TECHNIQUE: Contiguous axial images were obtained from the base of the skull through the vertex without intravenous contrast. COMPARISON:  None. FINDINGS: Brain: There is no acute intracranial hemorrhage, mass effect, or edema. There is no acute appearing  loss of gray-white differentiation. Prominence of the ventricles and sulci reflects generalized parenchymal volume loss. There is ex vacuo dilatation of the frontal horn of the right lateral ventricle contiguous with an infarct. Patchy and confluent areas of hypoattenuation in the supratentorial white matter are nonspecific but probably reflect moderate chronic microvascular ischemic changes. There are age-indeterminate small vessel infarcts of the right cerebellum. Vascular: Mild focal hyperdensity along a proximal right M2 MCA branch. There is intracranial atherosclerotic calcification at the skull base. Skull: Unremarkable. Sinuses/Orbits: No acute finding. Other: Mastoid air cells are clear. ASPECTS ([place] Stroke Program Early CT Score) - Ganglionic level infarction (caudate, lentiform nuclei, internal capsule, insula, M1-M3 cortex): 7 - Supraganglionic infarction (M4-M6 cortex): 3 Total score (0-10 with 10 being normal): 10 IMPRESSION: No acute intracranial hemorrhage or evidence of acute infarction. ASPECT score is 10. Age-indeterminate small vessel infarcts of the right cerebellum. Focus of mild hyperdensity along a proximal right M2 MCA branch may be artifactual. Correlation will be made with forthcoming CT vascular imaging. These results were called by telephone at the  time of interpretation on 07/24/2020 at 12:22 pm to provider [facility] , who verbally acknowledged these results. Electronically Signed   By: Guadlupe Spanish M.D.   On: 07/24/2020 12:27     Microbiology: Recent Results (from the past 240 hour(s))  SARS Coronavirus 2 by RT PCR (hospital order, performed in [facility] hospital lab) Nasopharyngeal Nasopharyngeal Swab     Status: None   Collection Time: 07/24/20 12:20 PM   Specimen: Nasopharyngeal Swab  Result Value Ref Range Status   SARS Coronavirus 2 NEGATIVE NEGATIVE Final    Comment: (NOTE) SARS-CoV-2 target nucleic acids are NOT DETECTED.  The SARS-CoV-2 RNA is generally detectable in upper and lower respiratory specimens during the acute phase of infection. The lowest concentration of SARS-CoV-2 viral copies this assay can detect is 250 copies / mL. A negative result does not preclude SARS-CoV-2 infection and should not be used as the sole basis for treatment or other patient management decisions.  A negative result may occur with improper specimen collection / handling, submission of specimen other than nasopharyngeal swab, presence of viral mutation(s) within the areas targeted by this assay, and inadequate number of viral copies (<250 copies / mL). A negative result must be combined with clinical observations, patient history, and epidemiological information.  Fact Sheet for Patients:   BoilerBrush.com.cy  Fact Sheet for Healthcare Providers: https://pope.com/  This test is not yet approved or  cleared by the [place] FDA and has been authorized for detection and/or diagnosis of SARS-CoV-2 by FDA under an Emergency Use Authorization (EUA).  This EUA will remain in effect (meaning this test can be used) for the duration of the COVID-19 declaration under Section 564(b)(1) of the Act, 21 U.S.C. section 360bbb-3(b)(1), unless the authorization is terminated or revoked  sooner.  Performed at [facility], [address], [place], [place] [address]      Labs: Basic Metabolic Panel: Recent Labs  Lab 07/24/20 1205 07/24/20 1205 07/24/20 1210 07/25/20 0623  NA 136  --  139 137  K 2.4*   < > 2.4* 3.1*  CL 98  --  97* 105  CO2 27  --   --  23  GLUCOSE 103*  --  100* 79  BUN 18  --  17 16  CREATININE 1.26*  --  1.30* 1.13*  CALCIUM 8.9  --   --  8.9  MG 2.3  --   --  2.3   < > = values in this interval not displayed.   Liver Function Tests: Recent Labs  Lab 07/24/20 1205  AST 23  ALT 19  ALKPHOS 43  BILITOT 1.0  PROT 6.4*  ALBUMIN 3.6   No results for input(s): LIPASE, AMYLASE in the last 168 hours. No results for input(s): AMMONIA in the last 168 hours. CBC: Recent Labs  Lab 07/24/20 1205 07/24/20 1210  WBC 6.5  --   NEUTROABS 3.5  --   HGB 11.9* 11.9*  HCT 35.3* 35.0*  MCV 99.2  --   PLT 167  --    Cardiac Enzymes: No results for input(s): CKTOTAL, CKMB, CKMBINDEX, TROPONINI in the last 168 hours. BNP: Invalid input(s): POCBNP CBG: Recent Labs  Lab 07/24/20 1205  GLUCAP 82    Time coordinating discharge:  36 minutes  Signed:  Catarina Hartshorn, DO [facility] Pager: [phone] 07/26/2020, 12:09 PM

## 2020-07-26 NOTE — TOC Progression Note (Signed)
Transition of Care [facility]) - Progression Note    Patient Details  Name: Catherine Anthony MRN: [id number] Date of Birth: Feb 25, 1930  Transition of [facility]) CM/SW Contact  Barry Brunner, LCSW Phone Number: 07/26/2020, 4:05 PM  Clinical Narrative:     To whom this may concern, it is necessary to dicharge Catherine Anthony to SNF before she has bet her 3 night stay in the hospital due to increase census during Covid 19 pandemic.      Barriers to Discharge: Barriers Resolved  Expected Discharge Plan and Services           Expected Discharge Date: 07/26/20                  Readmission Risk Interventions No flowsheet data found.

## 2020-07-26 NOTE — TOC Transition Note (Addendum)
Transition of Care [facility]) - CM/SW Discharge Note   Patient Details  Name: Catherine Anthony MRN: [id number] Date of Birth: May 04, 1930  Transition of Care [facility]) CM/SW Contact:  Barry Brunner, LCSW Phone Number: 07/26/2020, 3:43 PM   Clinical Narrative:    Patient rescinded acceptance for [facility] SNF due to notification by facility that a member was tested positive for Covid 19. Patient's family reported that patient has not been vaccinated for Covid 19. Patient's family reported that they would like to utilize [facility] instead of [facility]. CSW contacted [facility] to verify their ability to take patient on 08/27. Katy with [facility] reported that paperwork for admission would be need to be completed by family member prior to patient's arrival due to timing of patient's discharge. Patient's daughter agreeable to complete paperwork and has confirmed willingness with [facility]. CSW notified Olegario Messier with [facility] of patient's vaccination status. Olegario Messier identified appropriate room for patient given vaccination status. CSW scheduled priority transport with [facility] and printed med necessity to 300 floor. Olegario Messier requested signed notice of medicare waiver. CSW has faxed waiver signed by MD to [place]. Nurse to call report. TOC signing off.      Barriers to Discharge: Continued Medical Work up   Patient Goals and CMS Choice        Discharge Placement                       Discharge Plan and Services                                     Social Determinants of Health (SDOH) Interventions     Readmission Risk Interventions No flowsheet data found.

## 2020-07-26 NOTE — Care Management Important Message (Signed)
Important Message  Patient Details  Name: Catherine Anthony MRN: [id number] Date of Birth: 1929/12/08   Medicare Important Message Given:  Yes     Corey Harold 07/26/2020, 4:07 PM

## 2020-07-29 ENCOUNTER — Non-Acute Institutional Stay (SKILLED_NURSING_FACILITY): Payer: Medicare Other | Admitting: Adult Health

## 2020-07-29 ENCOUNTER — Encounter: Payer: Self-pay | Admitting: Adult Health

## 2020-07-29 DIAGNOSIS — I10 Essential (primary) hypertension: Secondary | ICD-10-CM | POA: Diagnosis not present

## 2020-07-29 DIAGNOSIS — I480 Paroxysmal atrial fibrillation: Secondary | ICD-10-CM | POA: Diagnosis not present

## 2020-07-29 DIAGNOSIS — R630 Anorexia: Secondary | ICD-10-CM | POA: Diagnosis not present

## 2020-07-29 DIAGNOSIS — I639 Cerebral infarction, unspecified: Secondary | ICD-10-CM | POA: Diagnosis not present

## 2020-07-29 DIAGNOSIS — E876 Hypokalemia: Secondary | ICD-10-CM

## 2020-07-29 NOTE — Progress Notes (Addendum)
Location:  [facility] Room Number: 313-A Place of Service:  SNF (31) Provider:  Kenard Gower, DNP, FNP-BC  Patient Care Team: [name], MD as PCP - General (Internal Medicine)  Extended Emergency Contact Information Primary Emergency Contact: Schreier,[name] Address: [address]          [place], [place] [address] [name] of [place] Home Phone: [phone] Mobile Phone: [phone] Relation: Daughter Secondary Emergency Contact: Ladawna, [place] Home Phone: [phone] Mobile Phone: [phone] Relation: Daughter  Code Status:  FULL CODE  Goals of care: Advanced Directive information Advanced Directives 07/24/2020  Does Patient Have a Medical Advance Directive? No  Type of Advance Directive -  Does patient want to make changes to medical advance directive? -  Copy of Healthcare Power of Attorney in Chart? -  Would patient like information on creating a medical advance directive? No - Patient declined  Pre-existing out of facility DNR order (yellow form or pink MOST form) -     Chief Complaint  Patient presents with  . Acute Visit    Patient is seen for hospital followup, status post    Pt is an 86 y.o. female seen today for hospital follow-up. She was admitted to [facility] on 07/26/2020 post hospitalization 07/24/2020 to  07/26/20 for an acute ischemic embolic stroke. She has a PMH of essential hypertension, hyperlipidemia, chronic kidney disease stage IIIb, PAF and S/P TAVR. On 07/24/2020, she woke up feeling normal. She was sitting on her couch trying to get up to the kitchen when she had difficulty getting up and noted the left-sided weakness. EMS was activated and code stroke was initiated. In the ED, BMP was unremarkable except for K2.4, low. CT of the brain was negative for any acute findings but showed an age-indeterminate infarct in the right cerebellum. CT brain perfusion was negative for LVO CT head and  neck showed 50% stenosis of the right ICA and less than 50% stenosis of the left ICA. Neurology was consulted. She was put on aspirin 81 mg x 1 week with apixaban. Lisinopril and hydralazine were held to allow permissive hypertension but is now taking them again. SBPs ranging 131-167.  She was seen in the room today. Noted to have left hemiplegia but alert and oriented x3. She denies any pain.   Past Medical History:  Diagnosis Date  . Arthritis    Right hand  . Complication of anesthesia   . Coronary artery disease    s/p mutliple PCIs  . Family hx of colon cancer   . High cholesterol   . History of kidney stones   . Hypertension   . PAF (paroxysmal atrial fibrillation) (HCC)    on Eliquis  . S/P TAVR (transcatheter aortic valve replacement)   . Severe aortic stenosis    Past Surgical History:  Procedure Laterality Date  . CATARACT EXTRACTION W/ INTRAOCULAR LENS IMPLANT Right 06/04/2011   [facility]  . CATARACT EXTRACTION W/PHACO  06/22/2011   Procedure: CATARACT EXTRACTION PHACO AND INTRAOCULAR LENS PLACEMENT (IOC);  Surgeon: Gemma Payor;  Location: [facility] ORS;  Service: Ophthalmology;  Laterality: Left;  CDE 14.34  . COLONOSCOPY  06/23/08  . COLONOSCOPY N/A 01/18/2014   Procedure: COLONOSCOPY;  Surgeon: Malissa Hippo, MD;  Location: [facility] ENDO SUITE;  Service: Endoscopy;  Laterality: N/A;  145  . CORONARY ANGIOPLASTY WITH STENT PLACEMENT    . CORONARY ATHERECTOMY N/A 09/21/2017   Procedure: CORONARY ATHERECTOMY;  Surgeon: Elder Negus, MD;  Location: [facility] INVASIVE  CV LAB;  Service: Cardiovascular;  Laterality: N/A;  . CORONARY STENT INTERVENTION N/A 08/31/2017   Procedure: CORONARY STENT INTERVENTION;  Surgeon: Elder Negus, MD;  Location: [facility] INVASIVE CV LAB;  Service: Cardiovascular;  Laterality: N/A;  . CORONARY STENT INTERVENTION N/A 09/21/2017   Procedure: CORONARY STENT INTERVENTION;  Surgeon: Elder Negus, MD;  Location: [facility] INVASIVE CV LAB;  Service: Cardiovascular;   Laterality: N/A;  . EYE SURGERY Bilateral    "after cataract OR"  . INTRAVASCULAR ULTRASOUND/IVUS N/A 09/21/2017   Procedure: Intravascular Ultrasound/IVUS;  Surgeon: Elder Negus, MD;  Location: [facility] INVASIVE CV LAB;  Service: Cardiovascular;  Laterality: N/A;  . RIGHT/LEFT HEART CATH AND CORONARY ANGIOGRAPHY N/A 08/31/2017   Procedure: RIGHT/LEFT HEART CATH AND CORONARY ANGIOGRAPHY;  Surgeon: Elder Negus, MD;  Location: [facility] INVASIVE CV LAB;  Service: Cardiovascular;  Laterality: N/A;  . RIGHT/LEFT HEART CATH AND CORONARY ANGIOGRAPHY N/A 05/16/2019   Procedure: RIGHT/LEFT HEART CATH AND CORONARY ANGIOGRAPHY;  Surgeon: Elder Negus, MD;  Location: [facility] INVASIVE CV LAB;  Service: Cardiovascular;  Laterality: N/A;  . TEE WITHOUT CARDIOVERSION N/A 06/06/2019   Procedure: TRANSESOPHAGEAL ECHOCARDIOGRAM (TEE);  Surgeon: Tonny Bollman, MD;  [facility] INV[facility] CV LAB;  Service: Open Heart Surgery;  Laterality: N/A;  . TRANSCATHETER AORTIC VALVE REPLACEMENT, TRANSFEMORAL N/A 06/06/2019   Procedure: TRANSCATHETER AORTIC VALVE REPLACEMENT, TRANSFEMORAL;  Surgeon: Tonny Bollman, MD;  Location: [facility] INVASIVE CV LAB;  Service: Open Heart Surgery;  Laterality: N/A;  . ULTRASOUND GUIDANCE FOR VASCULAR ACCESS  09/21/2017   Procedure: Ultrasound Guidance For Vascular Access;  Surgeon: Elder Negus, MD;  Location: [facility] INVASIVE CV LAB;  Service: Cardiovascular;;  . VARICOSE VEIN SURGERY Bilateral 1960s   [facility]    Allergies  Allergen Reactions  . Codeine Nausea And Vomiting    Outpatient Encounter Medications as of 07/29/2020  Medication Sig  . acetaminophen (TYLENOL) 325 MG tablet Take 650 mg by mouth every 6 (six) hours as needed.  Marland Kitchen amiodarone (PACERONE) 200 MG tablet Take 200 mg by mouth as directed. Monday and Friday  . aspirin EC 81 MG EC tablet Take 1 tablet (81 mg total) by mouth daily. Swallow whole. X 6 more days only, then discontinue  . Cholecalciferol (VITAMIN D3) 50  MCG (2000 UT) TABS Take 1 tablet by mouth daily.  Marland Kitchen ELIQUIS 2.5 MG TABS tablet TAKE (1) TABLET BY MOUTH TWICE DAILY.  . furosemide (LASIX) 20 MG tablet Take 1 tablet (20 mg total) by mouth daily.  . hydrALAZINE (APRESOLINE) 25 MG tablet Take 1 tablet (25 mg total) by mouth 3 (three) times daily.  Marland Kitchen lisinopril (PRINIVIL,ZESTRIL) 40 MG tablet Take 40 mg by mouth daily.   . metoprolol tartrate (LOPRESSOR) 25 MG tablet TAKE (1) TABLET BY MOUTH TWICE DAILY.  . mirtazapine (REMERON) 15 MG tablet Take 7.5-15 mg by mouth at bedtime.  . nitroGLYCERIN (NITROSTAT) 0.4 MG SL tablet Place 0.4 mg under the tongue every 5 (five) minutes x 3 doses as needed for chest pain.   . rosuvastatin (CRESTOR) 20 MG tablet TAKE (1) TABLET BY MOUTH ONCE DAILY.  . [DISCONTINUED] VITAMIN D, CHOLECALCIFEROL, PO Take 1 tablet by mouth daily.   No facility-administered encounter medications on file as of 07/29/2020.    Review of Systems  GENERAL: No change in appetite, no fatigue, no weight changes, no fever, chills or weakness MOUTH and THROAT: Denies oral discomfort, gingival pain or bleeding RESPIRATORY: no cough, SOB, DOE, wheezing, hemoptysis CARDIAC: No chest pain, edema or  palpitations GI: No abdominal pain, diarrhea, constipation, heart burn, nausea or vomiting GU: Denies dysuria, frequency, hematuria, incontinence, or discharge NEUROLOGICAL: Denies dizziness, syncope or headache PSYCHIATRIC: Denies feelings of depression or anxiety. No report of hallucinations, insomnia, paranoia, or agitation   Immunization History  Administered Date(s) Administered  . Influenza-Unspecified 11/30/2018  . Zoster Recombinat (Shingrix) 02/02/2019, 04/11/2019   Pertinent  Health Maintenance Due  Topic Date Due  . DEXA SCAN  Never done  . PNA vac Low Risk Adult (1 of 2 - PCV13) Never done  . INFLUENZA VACCINE  06/30/2020   Fall Risk  05/25/2019 07/24/2016  Falls in the past year? 0 No  Comment - Emmi Telephone Survey: data  to providers prior to load     Vitals:   07/29/20 1156  BP: 131/69  Pulse: 90  Resp: 18  Temp: (!) 97.4 F (36.3 C)  TempSrc: Oral  Weight: 92 lb 5.9 oz (41.9 kg)  Height: 5\' 4"  (1.626 m)   Body mass index is 15.86 kg/m.  Physical Exam  GENERAL APPEARANCE:  In no acute distress.  SKIN:  Skin is warm and dry.  MOUTH and THROAT: Lips are without lesions. Oral mucosa is moist and without lesions. Tongue is normal in shape, size, and color and without lesions RESPIRATORY: Breathing is even & unlabored, BS CTAB CARDIAC: Irregularly irregular, no murmur,no extra heart sounds, no edema GI: Abdomen soft, normal BS, no masses, no tenderness NEUROLOGICAL: There is no tremor. Speech is clear. Alert and oriented X 3. Left hemiplegia. PSYCHIATRIC:  Affect and behavior are appropriate  Labs reviewed: Recent Labs    09/01/19 1205 07/24/20 1205 07/24/20 1210 07/25/20 0623  NA  --  136 139 137  K  --  2.4* 2.4* 3.1*  CL  --  98 97* 105  CO2  --  27  --  23  GLUCOSE  --  103* 100* 79  BUN  --  18 17 16   CREATININE  --  1.26* 1.30* 1.13*  CALCIUM  --  8.9  --  8.9  MG 2.2 2.3  --  2.3   Recent Labs    07/24/20 1205  AST 23  ALT 19  ALKPHOS 43  BILITOT 1.0  PROT 6.4*  ALBUMIN 3.6   Recent Labs    07/24/20 1205 07/24/20 1210  WBC 6.5  --   NEUTROABS 3.5  --   HGB 11.9* 11.9*  HCT 35.3* 35.0*  MCV 99.2  --   PLT 167  --    No results found for: TSH Lab Results  Component Value Date   HGBA1C 5.1 07/24/2020   Lab Results  Component Value Date   CHOL 112 07/25/2020   HDL 54 07/25/2020   LDLCALC 42 07/25/2020   TRIG 81 07/25/2020   CHOLHDL 2.1 07/25/2020    Significant Diagnostic Results in last 30 days:  CT Angio Head W or Wo Contrast  Result Date: 07/24/2020 CLINICAL DATA:  Left-sided weakness EXAM: CT ANGIOGRAPHY HEAD AND NECK CT PERFUSION BRAIN TECHNIQUE: Multidetector CT imaging of the head and neck was performed using the standard protocol during bolus  administration of intravenous contrast. Multiplanar CT image reconstructions and MIPs were obtained to evaluate the vascular anatomy. Carotid stenosis measurements (when applicable) are obtained utilizing NASCET criteria, using the distal internal carotid diameter as the denominator. Multiphase CT imaging of the brain was performed following IV bolus contrast injection. Subsequent parametric perfusion maps were calculated using RAPID software. CONTRAST:  74mL OMNIPAQUE IOHEXOL  350 MG/ML SOLN, 60mL OMNIPAQUE IOHEXOL 350 MG/ML SOLN COMPARISON:  None. FINDINGS: CTA NECK FINDINGS Aortic arch: Great vessel origins are patent with calcified plaque present. Right carotid system: Patent. Mixed but primarily calcified plaque at the ICA origin causing approximately 50% stenosis. Left carotid system: Patent. Mixed but primarily calcified plaque at the ICA origin causing less than 50% stenosis. Vertebral arteries: Patent. Right vertebral artery is dominant. There is calcified plaque at the left vertebral origin with potential significant stenosis. Skeleton: Degenerative changes of the cervical spine. Other neck: No mass or adenopathy. Upper chest: No apical lung mass Review of the MIP images confirms the above findings CTA HEAD FINDINGS Anterior circulation: Intracranial internal carotid arteries are patent with mild calcified plaque. Anterior and middle cerebral arteries are patent. Right A1 ACA is dominant. Mild stenosis of the left M1 MCA. Posterior circulation: Intracranial vertebral arteries are patent with mild atherosclerotic irregularity. Patent PICA origins. Basilar artery is patent. Posterior cerebral arteries are patent. Large bilateral posterior communicating arteries. Venous sinuses: As permitted by contrast timing, patent. Review of the MIP images confirms the above findings CT Brain Perfusion Findings: CBF (<30%) Volume: 0mL Perfusion (Tmax>6.0s) volume: 0mL Mismatch Volume: 0mL Infarction Location:None.  IMPRESSION: No large vessel occlusion. Perfusion imaging demonstrates no evidence of core infarction or territory at risk. Primarily calcified plaque at the ICA origins causing approximately 50% stenosis on the right and less than 50% stenosis on the left. Mild stenosis of the intracranial carotids and left M1 MCA. Electronically Signed   By: Guadlupe Spanish M.D.   On: 07/24/2020 12:49   DG Chest 2 View  Result Date: 07/24/2020 CLINICAL DATA:  Stroke, potential discharge EXAM: CHEST - 2 VIEW COMPARISON:  06/06/2019 FINDINGS: Heart is normal size. Mild hyperinflation/COPD. No confluent opacities or effusions. Prior aortic valve repair. No acute bony abnormality. IMPRESSION: Hyperinflation/COPD.  No active disease. Electronically Signed   By: Charlett Nose M.D.   On: 07/24/2020 19:41   CT Angio Neck W and/or Wo Contrast  Result Date: 07/24/2020 CLINICAL DATA:  Left-sided weakness EXAM: CT ANGIOGRAPHY HEAD AND NECK CT PERFUSION BRAIN TECHNIQUE: Multidetector CT imaging of the head and neck was performed using the standard protocol during bolus administration of intravenous contrast. Multiplanar CT image reconstructions and MIPs were obtained to evaluate the vascular anatomy. Carotid stenosis measurements (when applicable) are obtained utilizing NASCET criteria, using the distal internal carotid diameter as the denominator. Multiphase CT imaging of the brain was performed following IV bolus contrast injection. Subsequent parametric perfusion maps were calculated using RAPID software. CONTRAST:  40mL OMNIPAQUE IOHEXOL 350 MG/ML SOLN, 60mL OMNIPAQUE IOHEXOL 350 MG/ML SOLN COMPARISON:  None. FINDINGS: CTA NECK FINDINGS Aortic arch: Great vessel origins are patent with calcified plaque present. Right carotid system: Patent. Mixed but primarily calcified plaque at the ICA origin causing approximately 50% stenosis. Left carotid system: Patent. Mixed but primarily calcified plaque at the ICA origin causing less than 50%  stenosis. Vertebral arteries: Patent. Right vertebral artery is dominant. There is calcified plaque at the left vertebral origin with potential significant stenosis. Skeleton: Degenerative changes of the cervical spine. Other neck: No mass or adenopathy. Upper chest: No apical lung mass Review of the MIP images confirms the above findings CTA HEAD FINDINGS Anterior circulation: Intracranial internal carotid arteries are patent with mild calcified plaque. Anterior and middle cerebral arteries are patent. Right A1 ACA is dominant. Mild stenosis of the left M1 MCA. Posterior circulation: Intracranial vertebral arteries are patent with mild atherosclerotic irregularity. Patent  PICA origins. Basilar artery is patent. Posterior cerebral arteries are patent. Large bilateral posterior communicating arteries. Venous sinuses: As permitted by contrast timing, patent. Review of the MIP images confirms the above findings CT Brain Perfusion Findings: CBF (<30%) Volume: 0mL Perfusion (Tmax>6.0s) volume: 0mL Mismatch Volume: 0mL Infarction Location:None. IMPRESSION: No large vessel occlusion. Perfusion imaging demonstrates no evidence of core infarction or territory at risk. Primarily calcified plaque at the ICA origins causing approximately 50% stenosis on the right and less than 50% stenosis on the left. Mild stenosis of the intracranial carotids and left M1 MCA. Electronically Signed   By: Guadlupe SpanishPraneil  Patel M.D.   On: 07/24/2020 12:49   MR ANGIO HEAD WO CONTRAST  Result Date: 07/24/2020 CLINICAL DATA:  Left-sided weakness noticed today. EXAM: MRI HEAD WITHOUT CONTRAST MRA HEAD WITHOUT CONTRAST TECHNIQUE: Multiplanar, multiecho pulse sequences of the brain and surrounding structures were obtained without intravenous contrast. Angiographic images of the head were obtained using MRA technique without contrast. COMPARISON:  CT studies same day. FINDINGS: MRI HEAD FINDINGS Brain: Diffusion imaging shows acute infarction on the right  affecting the external capsule and corona radiata region. No hemorrhage or mass effect. 2 acute punctate infarctions in the left frontal cortex region. Old small vessel cerebellar infarctions. Old small vessel infarction right pons. Wallerian degeneration on the right. Old infarction of the right caudate head and adjacent white matter tracks per moderate chronic small-vessel ischemic changes of the white matter elsewhere. No cortical or large vessel territory infarction. No sign of hemorrhage, hydrocephalus or extra-axial collection. Vascular: Major vessels at the base of the brain show flow. Skull and upper cervical spine: Negative Sinuses/Orbits: Clear/normal Other: Small left mastoid effusion. MRA HEAD FINDINGS Some motion degradation. Both internal carotid arteries are patent through the skull base and siphon regions. Both anterior and middle cerebral arteries are patent. Distal vessels not accurately evaluated because of motion. Right vertebral artery is widely patent to the basilar. Question abnormal slow or retrograde flow in the left vertebral artery. No basilar stenosis. Flow is present in the superior cerebellar and posterior cerebral arteries. Primary anterior circulation supply of the posterior cerebral arteries. Again, distal vessel detail is compromised by motion. IMPRESSION: Acute infarction affecting the right external capsule and corona radiata. No hemorrhage or mass effect. Two punctate acute infarctions in the left frontal cortex. Chronic small-vessel ischemic change elsewhere. Old infarction right caudate head and adjacent white matter. Motion degraded intracranial MR angiography does not show any large or medium vessel occlusion. Apparent slow or retrograde flow in the distal left vertebral artery, not confidently established because of motion. Electronically Signed   By: Paulina FusiMark  Shogry M.D.   On: 07/24/2020 15:23   MR BRAIN WO CONTRAST  Result Date: 07/24/2020 CLINICAL DATA:  Left-sided  weakness noticed today. EXAM: MRI HEAD WITHOUT CONTRAST MRA HEAD WITHOUT CONTRAST TECHNIQUE: Multiplanar, multiecho pulse sequences of the brain and surrounding structures were obtained without intravenous contrast. Angiographic images of the head were obtained using MRA technique without contrast. COMPARISON:  CT studies same day. FINDINGS: MRI HEAD FINDINGS Brain: Diffusion imaging shows acute infarction on the right affecting the external capsule and corona radiata region. No hemorrhage or mass effect. 2 acute punctate infarctions in the left frontal cortex region. Old small vessel cerebellar infarctions. Old small vessel infarction right pons. Wallerian degeneration on the right. Old infarction of the right caudate head and adjacent white matter tracks per moderate chronic small-vessel ischemic changes of the white matter elsewhere. No cortical or large vessel  territory infarction. No sign of hemorrhage, hydrocephalus or extra-axial collection. Vascular: Major vessels at the base of the brain show flow. Skull and upper cervical spine: Negative Sinuses/Orbits: Clear/normal Other: Small left mastoid effusion. MRA HEAD FINDINGS Some motion degradation. Both internal carotid arteries are patent through the skull base and siphon regions. Both anterior and middle cerebral arteries are patent. Distal vessels not accurately evaluated because of motion. Right vertebral artery is widely patent to the basilar. Question abnormal slow or retrograde flow in the left vertebral artery. No basilar stenosis. Flow is present in the superior cerebellar and posterior cerebral arteries. Primary anterior circulation supply of the posterior cerebral arteries. Again, distal vessel detail is compromised by motion. IMPRESSION: Acute infarction affecting the right external capsule and corona radiata. No hemorrhage or mass effect. Two punctate acute infarctions in the left frontal cortex. Chronic small-vessel ischemic change elsewhere. Old  infarction right caudate head and adjacent white matter. Motion degraded intracranial MR angiography does not show any large or medium vessel occlusion. Apparent slow or retrograde flow in the distal left vertebral artery, not confidently established because of motion. Electronically Signed   By: Paulina Fusi M.D.   On: 07/24/2020 15:23   CT CEREBRAL PERFUSION W CONTRAST  Result Date: 07/24/2020 CLINICAL DATA:  Left-sided weakness EXAM: CT ANGIOGRAPHY HEAD AND NECK CT PERFUSION BRAIN TECHNIQUE: Multidetector CT imaging of the head and neck was performed using the standard protocol during bolus administration of intravenous contrast. Multiplanar CT image reconstructions and MIPs were obtained to evaluate the vascular anatomy. Carotid stenosis measurements (when applicable) are obtained utilizing NASCET criteria, using the distal internal carotid diameter as the denominator. Multiphase CT imaging of the brain was performed following IV bolus contrast injection. Subsequent parametric perfusion maps were calculated using RAPID software. CONTRAST:  40mL OMNIPAQUE IOHEXOL 350 MG/ML SOLN, 60mL OMNIPAQUE IOHEXOL 350 MG/ML SOLN COMPARISON:  None. FINDINGS: CTA NECK FINDINGS Aortic arch: Great vessel origins are patent with calcified plaque present. Right carotid system: Patent. Mixed but primarily calcified plaque at the ICA origin causing approximately 50% stenosis. Left carotid system: Patent. Mixed but primarily calcified plaque at the ICA origin causing less than 50% stenosis. Vertebral arteries: Patent. Right vertebral artery is dominant. There is calcified plaque at the left vertebral origin with potential significant stenosis. Skeleton: Degenerative changes of the cervical spine. Other neck: No mass or adenopathy. Upper chest: No apical lung mass Review of the MIP images confirms the above findings CTA HEAD FINDINGS Anterior circulation: Intracranial internal carotid arteries are patent with mild calcified plaque.  Anterior and middle cerebral arteries are patent. Right A1 ACA is dominant. Mild stenosis of the left M1 MCA. Posterior circulation: Intracranial vertebral arteries are patent with mild atherosclerotic irregularity. Patent PICA origins. Basilar artery is patent. Posterior cerebral arteries are patent. Large bilateral posterior communicating arteries. Venous sinuses: As permitted by contrast timing, patent. Review of the MIP images confirms the above findings CT Brain Perfusion Findings: CBF (<30%) Volume: 0mL Perfusion (Tmax>6.0s) volume: 0mL Mismatch Volume: 0mL Infarction Location:None. IMPRESSION: No large vessel occlusion. Perfusion imaging demonstrates no evidence of core infarction or territory at risk. Primarily calcified plaque at the ICA origins causing approximately 50% stenosis on the right and less than 50% stenosis on the left. Mild stenosis of the intracranial carotids and left M1 MCA. Electronically Signed   By: Guadlupe Spanish M.D.   On: 07/24/2020 12:49   ECHOCARDIOGRAM COMPLETE  Result Date: 07/25/2020    ECHOCARDIOGRAM REPORT   Patient Name:  Catherine Anthony Date of Exam: 07/25/2020 Medical Rec #:  [id number]       Height:       64.0 in Accession #:    [id number]      Weight:       92.4 lb Date of Birth:  1930-06-11       BSA:          1.409 m Patient Age:    86 years        BP:           167/74 mmHg Patient Gender: F               HR:           60 bpm. [facility] Procedure: 2D Echo Indications:    stroke 434.91  History:        Patient has prior history of Echocardiogram examinations, most                 recent 05/09/2020. CAD; Risk Factors:Hypertension and                 Dyslipidemia. S/p TAVR. PAF. Hx of severe AS.  Sonographer:    Celene Skeen RDCS (AE) Referring Phys: [phone] DAVID TAT  Sonographer Comments: Suboptimal parasternal window. IMPRESSIONS  1. Left ventricular ejection fraction, by estimation, is 65 to 70%. The left ventricle has normal function. The left ventricle  has no regional wall motion abnormalities. Left ventricular diastolic parameters are consistent with Grade I diastolic dysfunction (impaired relaxation). Elevated left atrial pressure.  2. Right ventricular systolic function was not well visualized. The right ventricular size is not well visualized. There is mildly elevated pulmonary artery systolic pressure.  3. The mitral valve is normal in structure. Mild mitral valve regurgitation. No evidence of mitral stenosis.  4. Edwards Sapien 3 Ultra THV size 23 mm. The aortic valve was not well visualized. Aortic valve regurgitation is not visualized. No aortic stenosis is present. FINDINGS  Left Ventricle: Left ventricular ejection fraction, by estimation, is 65 to 70%. The left ventricle has normal function. The left ventricle has no regional wall motion abnormalities. The left ventricular internal cavity size was normal in size. There is  no left ventricular hypertrophy. Left ventricular diastolic parameters are consistent with Grade I diastolic dysfunction (impaired relaxation). Elevated left atrial pressure. Right Ventricle: The right ventricular size is not well visualized. Right vetricular wall thickness was not assessed. Right ventricular systolic function was not well visualized. There is mildly elevated pulmonary artery systolic pressure. The tricuspid regurgitant velocity is 2.92 m/s, and with an assumed right atrial pressure of 8 mmHg, the estimated right ventricular systolic pressure is 42.1 mmHg. Left Atrium: Left atrial size was normal in size. Right Atrium: Right atrial size was not well visualized. Pericardium: There is no evidence of pericardial effusion. Mitral Valve: The mitral valve is normal in structure. Moderate mitral annular calcification. Mild mitral valve regurgitation. No evidence of mitral valve stenosis. Tricuspid Valve: The tricuspid valve is not well visualized. Tricuspid valve regurgitation is not demonstrated. No evidence of tricuspid  stenosis. Aortic Valve: Edwards Sapien 3 Ultra THV size 23 mm. The aortic valve was not well visualized. Aortic valve regurgitation is not visualized. No aortic stenosis is present. Aortic valve mean gradient measures 5.2 mmHg. Aortic valve peak gradient measures 8.6 mmHg. Aortic valve area, by VTI measures 1.77 cm. Pulmonic Valve: The pulmonic valve was not well visualized. Pulmonic valve regurgitation is not visualized. No  evidence of pulmonic stenosis. Aorta: The aortic root is normal in size and structure. Pulmonary Artery: Indeterminant PASP, IVC poorly visualized. Venous: The inferior vena cava was not well visualized. IAS/Shunts: The interatrial septum was not well visualized.  LEFT VENTRICLE PLAX 2D LVIDd:         3.40 cm  Diastology LVIDs:         2.10 cm  LV e' lateral:   5.87 cm/s LV PW:         0.88 cm  LV E/e' lateral: 15.8 LV IVS:        1.12 cm  LV e' medial:    4.79 cm/s LVOT diam:     1.80 cm  LV E/e' medial:  19.3 LV SV:         59 LV SV Index:   42 LVOT Area:     2.54 cm  LEFT ATRIUM             Index LA diam:        2.50 cm 1.77 cm/m LA Vol (A2C):   16.8 ml 11.92 ml/m LA Vol (A4C):   28.8 ml 20.44 ml/m LA Biplane Vol: 24.4 ml 17.32 ml/m  AORTIC VALVE AV Area (Vmax):    1.75 cm AV Area (Vmean):   1.59 cm AV Area (VTI):     1.77 cm AV Vmax:           146.60 cm/s AV Vmean:          108.005 cm/s AV VTI:            0.331 m AV Peak Grad:      8.6 mmHg AV Mean Grad:      5.2 mmHg LVOT Vmax:         101.00 cm/s LVOT Vmean:        67.500 cm/s LVOT VTI:          0.230 m LVOT/AV VTI ratio: 0.70  AORTA Ao Root diam: 2.50 cm MITRAL VALVE                TRICUSPID VALVE MV Area (PHT): 1.63 cm     TR Peak grad:   34.1 mmHg MV Decel Time: 466 msec     TR Vmax:        292.00 cm/s MV E velocity: 92.60 cm/s MV A velocity: 133.00 cm/s  SHUNTS MV E/A ratio:  0.70         Systemic VTI:  0.23 m                             Systemic Diam: 1.80 cm Dina Rich MD Electronically signed by Dina Rich MD  Signature Date/Time: 07/25/2020/1:07:29 PM    Final    CT HEAD CODE STROKE WO CONTRAST  Result Date: 07/24/2020 CLINICAL DATA:  Code stroke.  Left-sided weakness EXAM: CT HEAD WITHOUT CONTRAST TECHNIQUE: Contiguous axial images were obtained from the base of the skull through the vertex without intravenous contrast. COMPARISON:  None. FINDINGS: Brain: There is no acute intracranial hemorrhage, mass effect, or edema. There is no acute appearing loss of gray-white differentiation. Prominence of the ventricles and sulci reflects generalized parenchymal volume loss. There is ex vacuo dilatation of the frontal horn of the right lateral ventricle contiguous with an infarct. Patchy and confluent areas of hypoattenuation in the supratentorial white matter are nonspecific but probably reflect moderate chronic microvascular ischemic changes. There are age-indeterminate small vessel infarcts  of the right cerebellum. Vascular: Mild focal hyperdensity along a proximal right M2 MCA branch. There is intracranial atherosclerotic calcification at the skull base. Skull: Unremarkable. Sinuses/Orbits: No acute finding. Other: Mastoid air cells are clear. ASPECTS (Alberta Stroke Program Early CT Score) - Ganglionic level infarction (caudate, lentiform nuclei, internal capsule, insula, M1-M3 cortex): 7 - Supraganglionic infarction (M4-M6 cortex): 3 Total score (0-10 with 10 being normal): 10 IMPRESSION: No acute intracranial hemorrhage or evidence of acute infarction. ASPECT score is 10. Age-indeterminate small vessel infarcts of the right cerebellum. Focus of mild hyperdensity along a proximal right M2 MCA branch may be artifactual. Correlation will be made with forthcoming CT vascular imaging. These results were called by telephone at the time of interpretation on 07/24/2020 at 12:22 pm to provider [facility] , who verbally acknowledged these results. Electronically Signed   By: Guadlupe Spanish M.D.   On: 07/24/2020 12:27     Assessment/Plan  1. Acute ischemic stroke (HCC) -CT brain was negative for acute finding, age indeterminant right cerebellar infarct MRI brain acute infarction affecting the right external capsule and corona radiata, 2 punctate acute infarctions left frontal cortex -CTA H&N showed 50% right ICA stenosis, <50% left ICA stenosis - Neurology was consulted and was a started on aspirin 81 mg x 1 week plus apixaban -Has left hemiplegia, for PT and OT, for therapeutic strengthening exercises -When resident gets discharged to home, will need DME wheelchair and 3-in-1. Wheelchair -she had acute ischemic/embolic stroke which impairs her ability to perform daily activities like toileting, dressing, grooming and bathing in the home. A cane or walker will not resolve the issue with performing activities of daily living. A wheelchair will allow resident to safely perform daily activities. She has a caregiver who can provide assistance  2. PAF (paroxysmal atrial fibrillation) (HCC) -Rate controlled, continue metoprolol tartrate and amiodarone for rate control and Eliquis for anticoagulation  3. Essential hypertension -Stable, continue metoprolol tartrate, hydralazine and lisinopril  4. Poor appetite -Continue Remeron  5. Hypokalemia Lab Results  Component Value Date   K 3.1 (L) 07/25/2020   -Was repleted and will  recheck    Family/ staff Communication: Discussed plan of care with resident and charge nurse.  Labs/tests ordered: None  Goals of care:   Short-term care   Kenard Gower, DNP, MSN, FNP-BC [facility] and Adult Medicine [phone] (Monday-Friday 8:00 a.m. - 5:00 p.m.) [phone] (after hours)

## 2020-07-30 ENCOUNTER — Non-Acute Institutional Stay (SKILLED_NURSING_FACILITY): Payer: Medicare Other | Admitting: Internal Medicine

## 2020-07-30 ENCOUNTER — Other Ambulatory Visit: Payer: Self-pay | Admitting: *Deleted

## 2020-07-30 ENCOUNTER — Encounter: Payer: Self-pay | Admitting: Internal Medicine

## 2020-07-30 DIAGNOSIS — I480 Paroxysmal atrial fibrillation: Secondary | ICD-10-CM | POA: Diagnosis not present

## 2020-07-30 DIAGNOSIS — I10 Essential (primary) hypertension: Secondary | ICD-10-CM | POA: Diagnosis not present

## 2020-07-30 DIAGNOSIS — I639 Cerebral infarction, unspecified: Secondary | ICD-10-CM | POA: Diagnosis not present

## 2020-07-30 DIAGNOSIS — G8194 Hemiplegia, unspecified affecting left nondominant side: Secondary | ICD-10-CM

## 2020-07-30 NOTE — Assessment & Plan Note (Signed)
BP controlled; no change in antihypertensive medications  

## 2020-07-30 NOTE — Assessment & Plan Note (Addendum)
07/30/2020 PT/OT describe left-sided flaccid paralysis.  They state that she is unable to sit up for even an hour.  Left hand edema did seem to improve with positioning and elevation.  PT/OT staff stated they were very concerned about her being at home without 24/7 supervision & rehab.

## 2020-07-30 NOTE — Assessment & Plan Note (Signed)
PT/OT at SNF °

## 2020-07-30 NOTE — Patient Instructions (Signed)
See assessment and plan under each diagnosis in the problem list and acutely for this visit 

## 2020-07-30 NOTE — Patient Outreach (Signed)
Member screened for potential [facility] Care Management needs as a benefit of [employer].  Per Patient Ilda Foil member resides in [place] and Rehab  Communication sent to SNF SW to request update on transtion plans.   Will continue to follow while member resides in SNF.  Raiford Noble, MSN-Ed, RN,BSN [facility] Post Acute Care Coordinator [phone] [facility]) [phone]  (Toll free office)

## 2020-07-30 NOTE — Progress Notes (Signed)
NURSING HOME LOCATION:  [facility] ROOM NUMBER:  [id number]  CODE STATUS:  FULL CODE  PCP:  [name], MD  [address] [place] [address]   This is a comprehensive admission note to [facility] performed on this date less than 30 days from date of admission. Included are preadmission medical/surgical history; reconciled medication list; family history; social history and comprehensive review of systems.  Corrections and additions to the records were documented. Comprehensive physical exam was also performed. Additionally a clinical summary was entered for each active diagnosis pertinent to this admission in the Problem List to enhance continuity of care.  HPI: Patient was hospitalized 8/25-8/27/2021 presenting from home with acute left-sided weakness which began approximately 10:30 AM on 8/25.  In the ED potassium was 2.4 which was repleted.CT of the brain was negative for acute findings but revealed an age indeterminate infarct in the right cerebellum.  CT brain perfusion was negative for LVO.  CTA of head/neck revealed 50% stenosis of the right ICA and less than 50% stenosis of the left ICA.  MRI of the brain revealed an acute infarction affecting the right external capsule and corona radiata; 2 punctate acute infarctions in the left frontal cortex.  Echo revealed EF of 65-70% with no source of thrombi.  LDL was 42 and A1c 5.1. Neurology was concerned about right MCA stroke.  Aspirin 81 mg x 6 days (8/28 through 9/2) plus maintenance apixaban was recommended.  To allow for permissive hypertension hydralazine was initially held unless the SBP was greater than 220.  At discharge hydralazine 25 mg 3 times daily was reinitiated.  Low-dose metoprolol and amiodarone 200 mg Monday and Friday were continued for PAF. CKD stage IIIb was present with baseline creatinine of 1-1 0.3.  Past medical and surgical history: Includes severe aortic stenosis, PAF, essential  hypertension, history of renal calculi, dyslipidemia, and CAD. Surgeries and procedures include TAVR and multiple (6) PCI's.  Social history: Nondrinker, never smoked.  Family history: Noncontributory due to advanced age.   Review of systems: She is an excellent historian.  She made the comment that "I am doing the best I can, but not much".  Her major issue obviously is a left-sided paralysis. She states that the entire left side is numb.She states that she has very little pain and this is only in the left leg.    She validates intermittent shortness of breath which she relates to having had 6 stents.  She rarely has any chest pain.   PT/OT documents faccidity of the left upper extremity.  Left hand was reportedly swollen but this has improved with elevation and positioning.  They state that she was able to sit for less than 1 hour and are concerned about her going home without 24/7 supervision & therapy.  Despite the catastrophic events; she denies any anxiety or depression but made the comment that "I might become depressed."  Constitutional: No fever, significant weight change  Eyes: No redness, discharge, pain, vision change ENT/mouth: No nasal congestion, purulent discharge, earache, change in hearing, sore throat  Cardiovascular: No  palpitations, paroxysmal nocturnal dyspnea Respiratory: No cough, sputum production, hemoptysis, significant snoring, apnea  Gastrointestinal: No heartburn, dysphagia, abdominal pain, nausea /vomiting, rectal bleeding, melena, change in bowels Genitourinary: No dysuria, hematuria, pyuria, incontinence, nocturia Musculoskeletal: No joint stiffness, joint swelling Dermatologic: No rash, pruritus, change in appearance of skin Neurologic: No dizziness, headache, syncope, seizures Psychiatric: No insomnia, anorexia Endocrine: No change in hair/skin/nails, excessive thirst,  excessive hunger, excessive urination  Hematologic/lymphatic: No significant bruising,  lymphadenopathy, abnormal bleeding Allergy/immunology: No itchy/watery eyes, significant sneezing, urticaria, angioedema  Physical exam:  Pertinent or positive findings: Affect is flat.  The left nasolabial fold is slightly decreased.  She exhibits a singsong type speech pattern.  She has a small nevus over the left lower lip.  There is a raspy systolic murmur at the lower left sternal border.  Second heart sound is increased.  Pedal pulses are decreased.  Left hemiparesis is present.  She is also weak in the right upper and right lower extremities as well.  She has extensive scattered bruising over the forearms.  General appearance: Adequately nourished; no acute distress, increased work of breathing is present.   Lymphatic: No lymphadenopathy about the head, neck, axilla. Eyes: No conjunctival inflammation or lid edema is present. There is no scleral icterus. Ears:  External ear exam shows no significant lesions or deformities.   Nose:  External nasal examination shows no deformity or inflammation. Nasal mucosa are pink and moist without lesions, exudates Oral exam: Lips and gums are healthy appearing.There is no oropharyngeal erythema or exudate. Neck:  No thyromegaly, masses, tenderness noted.    Heart:  Normal rate and regular rhythm. S1 normal without gallop,  click, rub.  Lungs: Chest clear to auscultation without wheezes, rhonchi, rales, rubs. Abdomen: Bowel sounds are normal.  Abdomen is soft and nontender with no organomegaly, hernias, masses. GU: Deferred  Extremities:  No cyanosis, clubbing. Neurologic exam: Balance, Rhomberg, finger to nose testing could not be completed due to clinical state Skin: Warm & dry w/o tenting. No significant rash.  See clinical summary under each active problem in the Problem List with associated updated therapeutic plan

## 2020-07-30 NOTE — Assessment & Plan Note (Addendum)
Amiodarone 200 mg Monday and Friday and low-dose metoprolol daily were continued for PAF. On today's exam rhythm is clinically regular.

## 2020-07-31 ENCOUNTER — Other Ambulatory Visit: Payer: Self-pay | Admitting: *Deleted

## 2020-07-31 NOTE — Patient Outreach (Signed)
[facility] Post- Acute Care Coordinator follow up.   Update received from [facility] SNF SW indicating member's family wants to take her home on this Friday.  Writer to outreach family/DPR to discuss [facility] Care Management follow up.  Raiford Noble, MSN-Ed, RN,BSN [facility] Post Acute Care Coordinator [phone] [facility]) [phone]  (Toll free office)

## 2020-08-01 ENCOUNTER — Non-Acute Institutional Stay (SKILLED_NURSING_FACILITY): Payer: Medicare Other | Admitting: Adult Health

## 2020-08-01 ENCOUNTER — Encounter: Payer: Self-pay | Admitting: Adult Health

## 2020-08-01 DIAGNOSIS — I5032 Chronic diastolic (congestive) heart failure: Secondary | ICD-10-CM

## 2020-08-01 DIAGNOSIS — E876 Hypokalemia: Secondary | ICD-10-CM | POA: Diagnosis not present

## 2020-08-01 DIAGNOSIS — I10 Essential (primary) hypertension: Secondary | ICD-10-CM

## 2020-08-01 DIAGNOSIS — I639 Cerebral infarction, unspecified: Secondary | ICD-10-CM

## 2020-08-01 DIAGNOSIS — I480 Paroxysmal atrial fibrillation: Secondary | ICD-10-CM

## 2020-08-01 DIAGNOSIS — R630 Anorexia: Secondary | ICD-10-CM | POA: Diagnosis not present

## 2020-08-01 MED ORDER — ROSUVASTATIN CALCIUM 20 MG PO TABS: ORAL_TABLET | ORAL | 0 refills | Status: DC

## 2020-08-01 MED ORDER — AMIODARONE HCL 200 MG PO TABS: 200.0000 mg | ORAL_TABLET | ORAL | 0 refills | Status: DC

## 2020-08-01 MED ORDER — HYDRALAZINE HCL 25 MG PO TABS: 25.0000 mg | ORAL_TABLET | Freq: Three times a day (TID) | ORAL | 0 refills | Status: DC

## 2020-08-01 MED ORDER — NITROGLYCERIN 0.4 MG SL SUBL: 0.4000 mg | SUBLINGUAL_TABLET | SUBLINGUAL | 0 refills | Status: AC | PRN

## 2020-08-01 MED ORDER — METOPROLOL TARTRATE 25 MG PO TABS: ORAL_TABLET | ORAL | 0 refills | Status: DC

## 2020-08-01 MED ORDER — APIXABAN 2.5 MG PO TABS: ORAL_TABLET | ORAL | 0 refills | Status: AC

## 2020-08-01 MED ORDER — LISINOPRIL 40 MG PO TABS: 40.0000 mg | ORAL_TABLET | Freq: Every day | ORAL | 0 refills | Status: DC

## 2020-08-01 MED ORDER — FUROSEMIDE 20 MG PO TABS: 20.0000 mg | ORAL_TABLET | Freq: Every day | ORAL | 0 refills | Status: DC

## 2020-08-01 MED ORDER — MIRTAZAPINE 15 MG PO TABS: 7.5000 mg | ORAL_TABLET | Freq: Every day | ORAL | 0 refills | Status: DC

## 2020-08-01 NOTE — Progress Notes (Signed)
Location:  [facility] Room Number: 313-A Place of Service:  SNF (31) Provider:  Kenard Gower, DNP, FNP-BC  Patient Care Team: [name], MD as PCP - General (Internal Medicine)  Extended Emergency Contact Information Primary Emergency Contact: Finder,[name] Address: [address]          [place], [place] [address] [name] of [place] Home Phone: [phone] Mobile Phone: [phone] Relation: Daughter Secondary Emergency Contact: Sybella, Harnish Home Phone: [phone] Mobile Phone: [phone] Relation: Daughter  Code Status:   Full Code  Goals of care: Advanced Directive information Advanced Directives 07/24/2020  Does Patient Have a Medical Advance Directive? No  Type of Advance Directive -  Does patient want to make changes to medical advance directive? -  Copy of Healthcare Power of Attorney in Chart? -  Would patient like information on creating a medical advance directive? No - Patient declined  Pre-existing out of facility DNR order (yellow form or pink MOST form) -     Chief Complaint  Patient presents with   Discharge Note    Patient is seen for discharge home on 08/02/20.    HPI:  Pt is an 86 y.o. female seen today for discharge home with her daughter on 08/02/20 with Home health PT, OT, ST and a Nurse for medication management.  She was admitted to [facility] on 07/26/20 for an acute ischemic embolic stroke. She has a PMH of essential hypertension, hyperlipidemia, chronic kidney disease stage IIIb, PAF and S/P TAVR. On 07/24/20, she woke up feeling normal. She was sitting on her couch trying to get up to the kitchen when she had difficulty getting up and noted left-sided weakness. EMS was activated and code stroke was initiated. In the ED, BMP was unremarkable except for K 2.4, low. CT of the brain was negative for any acute findings but showed an age-indeterminate infarct in the right cerebellum. CT  brain perfusion was negative for LVO. CT head and neck showed 50% stenosis of the right ICA and less than 50% stenosis of the left ICA. Neurology was consulted. She was put on ASA 81 mg X 1 week with Apixaban. Lisinopril and Hydralazine were held to allow permissive hypertension but is now taking them again. SBPs ranging from 131 to 167.  Patient was admitted to this facility for short-term rehabilitation after the patient's recent hospitalization.  Patient has completed SNF rehabilitation and therapy has cleared the patient for discharge.   Past Medical History:  Diagnosis Date   Arthritis    Right hand   Complication of anesthesia    Coronary artery disease    s/p mutliple PCIs   Dyslipidemia    Family hx of colon cancer    History of kidney stones    Hypertension    PAF (paroxysmal atrial fibrillation) (HCC)    on Eliquis   S/P TAVR (transcatheter aortic valve replacement)    Severe aortic stenosis    Past Surgical History:  Procedure Laterality Date   CATARACT EXTRACTION W/ INTRAOCULAR LENS IMPLANT Right 06/04/2011   [facility]   CATARACT EXTRACTION W/PHACO  06/22/2011   Procedure: CATARACT EXTRACTION PHACO AND INTRAOCULAR LENS PLACEMENT (IOC);  Surgeon: Gemma Payor;  Location: [facility] ORS;  Service: Ophthalmology;  Laterality: Left;  CDE 14.34   COLONOSCOPY  06/23/08   COLONOSCOPY N/A 01/18/2014   Procedure: COLONOSCOPY;  Surgeon: Malissa Hippo, MD;  Location: [facility] ENDO SUITE;  Service: Endoscopy;  Laterality: N/A;  145   CORONARY ANGIOPLASTY WITH STENT  PLACEMENT     CORONARY ATHERECTOMY N/A 09/21/2017   Procedure: CORONARY ATHERECTOMY;  Surgeon: Elder Negus, MD;  Location: [facility] INVASIVE CV LAB;  Service: Cardiovascular;  Laterality: N/A;   CORONARY STENT INTERVENTION N/A 08/31/2017   Procedure: CORONARY STENT INTERVENTION;  Surgeon: Elder Negus, MD;  Location: [facility] INVASIVE CV LAB;  Service: Cardiovascular;  Laterality: N/A;   CORONARY STENT INTERVENTION N/A  09/21/2017   Procedure: CORONARY STENT INTERVENTION;  Surgeon: Elder Negus, MD;  Location: [facility] INVASIVE CV LAB;  Service: Cardiovascular;  Laterality: N/A;   EYE SURGERY Bilateral    "after cataract OR"   INTRAVASCULAR ULTRASOUND/IVUS N/A 09/21/2017   Procedure: Intravascular Ultrasound/IVUS;  Surgeon: Elder Negus, MD;  Location: [facility] INVASIVE CV LAB;  Service: Cardiovascular;  Laterality: N/A;   RIGHT/LEFT HEART CATH AND CORONARY ANGIOGRAPHY N/A 08/31/2017   Procedure: RIGHT/LEFT HEART CATH AND CORONARY ANGIOGRAPHY;  Surgeon: Elder Negus, MD;  Location: [facility] INVASIVE CV LAB;  Service: Cardiovascular;  Laterality: N/A;   RIGHT/LEFT HEART CATH AND CORONARY ANGIOGRAPHY N/A 05/16/2019   Procedure: RIGHT/LEFT HEART CATH AND CORONARY ANGIOGRAPHY;  Surgeon: Elder Negus, MD;  Location: [facility]VAS[facility] CV LAB;  Service: Cardiovascular;  Laterality: N/A;   TEE WITHOUT CARDIOVERSION N/A 06/06/2019   Procedure: TRANSESOPHAGEAL ECHOCARDIOGRAM (TEE);  Surgeon: Tonny Bollman, MD;  Location: [facility] INVASIVE CV LAB;  Service: Open Heart Surgery;  Laterality: N/A;   TRANSCATHETER AORTIC VALVE REPLACEMENT, TRANSFEMORAL N/A 06/06/2019   Procedure: TRANSCATHETER AORTIC VALVE REPLACEMENT, TRANSFEMORAL;  Surgeon: Tonny Bollman, MD;  Location: [facility] INVASIVE CV LAB;  Service: Open Heart Surgery;  Laterality: N/A;   ULTRASOUND GUIDANCE FOR VASCULAR ACCESS  09/21/2017   Procedure: Ultrasound Guidance For Vascular Access;  Surgeon: Elder Negus, MD;  Location: [facility] INVASIVE CV LAB;  Service: Cardiovascular;;   VARICOSE VEIN SURGERY Bilateral 1960s   [facility]    Allergies  Allergen Reactions   Codeine Nausea And Vomiting    Outpatient Encounter Medications as of 08/01/2020  Medication Sig   acetaminophen (TYLENOL) 325 MG tablet Take 650 mg by mouth every 6 (six) hours as needed.   amiodarone (PACERONE) 200 MG tablet Take 200 mg by mouth as directed. Monday and Friday    Cholecalciferol (VITAMIN D3) 50 MCG (2000 UT) TABS Take 2 tablets by mouth daily.    ELIQUIS 2.5 MG TABS tablet TAKE (1) TABLET BY MOUTH TWICE DAILY.   furosemide (LASIX) 20 MG tablet Take 1 tablet (20 mg total) by mouth daily.   hydrALAZINE (APRESOLINE) 25 MG tablet Take 1 tablet (25 mg total) by mouth 3 (three) times daily.   lisinopril (PRINIVIL,ZESTRIL) 40 MG tablet Take 40 mg by mouth daily.    metoprolol tartrate (LOPRESSOR) 25 MG tablet TAKE (1) TABLET BY MOUTH TWICE DAILY.   mirtazapine (REMERON) 15 MG tablet Take 7.5 mg by mouth at bedtime.    nitroGLYCERIN (NITROSTAT) 0.4 MG SL tablet Place 0.4 mg under the tongue every 5 (five) minutes x 3 doses as needed for chest pain.    Nutritional Supplements (NUTRITIONAL SUPPLEMENT PO) Take 1 each by mouth in the morning and at bedtime. Magic Cup   rosuvastatin (CRESTOR) 20 MG tablet TAKE (1) TABLET BY MOUTH ONCE DAILY.   [DISCONTINUED] aspirin EC 81 MG EC tablet Take 1 tablet (81 mg total) by mouth daily. Swallow whole. X 6 more days only, then discontinue   No facility-administered encounter medications on file as of 08/01/2020.    Review of Systems  GENERAL: No change in  appetite, no fatigue, no weight changes, no fever, chills or weakness MOUTH and THROAT: Denies oral discomfort, gingival pain or bleeding RESPIRATORY: no cough, SOB, DOE, wheezing, hemoptysis CARDIAC: No chest pain, edema or palpitations GI: No abdominal pain, diarrhea, constipation, heart burn, nausea or vomiting GU: Denies dysuria, frequency, hematuria or discharge NEUROLOGICAL: Denies dizziness, syncope, numbness, or headache PSYCHIATRIC: Denies feelings of depression or anxiety. No report of hallucinations, insomnia, paranoia, or agitation   Immunization History  Administered Date(s) Administered   Influenza-Unspecified 11/30/2018   Zoster Recombinat (Shingrix) 02/02/2019, 04/11/2019   Pertinent  Health Maintenance Due  Topic Date Due   DEXA SCAN   Never done   PNA vac Low Risk Adult (1 of 2 - PCV13) Never done   INFLUENZA VACCINE  06/30/2020   Fall Risk  05/25/2019 07/24/2016  Falls in the past year? 0 No  Comment - Emmi Telephone Survey: data to providers prior to load     Vitals:   08/01/20 0949  BP: 98/66  Pulse: 62  Resp: 20  Temp: 97.9 F (36.6 C)  TempSrc: Oral  SpO2: 98%  Weight: 98 lb 4.8 oz (44.6 kg)  Height: 5\' 4"  (1.626 m)   Body mass index is 16.87 kg/m.  Physical Exam  GENERAL APPEARANCE:  In no acute distress.  SKIN:  Skin is warm and dry.  MOUTH and THROAT: Lips are without lesions. Oral mucosa is moist and without lesions.  RESPIRATORY: Breathing is even & unlabored, BS CTAB CARDIAC: RRR, no murmur,no extra heart sounds, no edema GI: Abdomen soft, normal BS, no masses, no tenderness NEUROLOGICAL: There is no tremor. Speech is slurred. Left hemiplegia. PSYCHIATRIC:  Affect and behavior are appropriate  Labs reviewed: Recent Labs    09/01/19 1205 07/24/20 1205 07/24/20 1210 07/25/20 0623  NA  --  136 139 137  K  --  2.4* 2.4* 3.1*  CL  --  98 97* 105  CO2  --  27  --  23  GLUCOSE  --  103* 100* 79  BUN  --  18 17 16   CREATININE  --  1.26* 1.30* 1.13*  CALCIUM  --  8.9  --  8.9  MG 2.2 2.3  --  2.3   Recent Labs    07/24/20 1205  AST 23  ALT 19  ALKPHOS 43  BILITOT 1.0  PROT 6.4*  ALBUMIN 3.6   Recent Labs    07/24/20 1205 07/24/20 1210  WBC 6.5  --   NEUTROABS 3.5  --   HGB 11.9* 11.9*  HCT 35.3* 35.0*  MCV 99.2  --   PLT 167  --     Lab Results  Component Value Date   HGBA1C 5.1 07/24/2020   Lab Results  Component Value Date   CHOL 112 07/25/2020   HDL 54 07/25/2020   LDLCALC 42 07/25/2020   TRIG 81 07/25/2020   CHOLHDL 2.1 07/25/2020    Significant Diagnostic Results in last 30 days:  CT Angio Head W or Wo Contrast  Result Date: 07/24/2020 CLINICAL DATA:  Left-sided weakness EXAM: CT ANGIOGRAPHY HEAD AND NECK CT PERFUSION BRAIN TECHNIQUE: Multidetector  CT imaging of the head and neck was performed using the standard protocol during bolus administration of intravenous contrast. Multiplanar CT image reconstructions and MIPs were obtained to evaluate the vascular anatomy. Carotid stenosis measurements (when applicable) are obtained utilizing NASCET criteria, using the distal internal carotid diameter as the denominator. Multiphase CT imaging of the brain was performed following IV bolus  contrast injection. Subsequent parametric perfusion maps were calculated using RAPID software. CONTRAST:  40mL OMNIPAQUE IOHEXOL 350 MG/ML SOLN, 60mL OMNIPAQUE IOHEXOL 350 MG/ML SOLN COMPARISON:  None. FINDINGS: CTA NECK FINDINGS Aortic arch: Great vessel origins are patent with calcified plaque present. Right carotid system: Patent. Mixed but primarily calcified plaque at the ICA origin causing approximately 50% stenosis. Left carotid system: Patent. Mixed but primarily calcified plaque at the ICA origin causing less than 50% stenosis. Vertebral arteries: Patent. Right vertebral artery is dominant. There is calcified plaque at the left vertebral origin with potential significant stenosis. Skeleton: Degenerative changes of the cervical spine. Other neck: No mass or adenopathy. Upper chest: No apical lung mass Review of the MIP images confirms the above findings CTA HEAD FINDINGS Anterior circulation: Intracranial internal carotid arteries are patent with mild calcified plaque. Anterior and middle cerebral arteries are patent. Right A1 ACA is dominant. Mild stenosis of the left M1 MCA. Posterior circulation: Intracranial vertebral arteries are patent with mild atherosclerotic irregularity. Patent PICA origins. Basilar artery is patent. Posterior cerebral arteries are patent. Large bilateral posterior communicating arteries. Venous sinuses: As permitted by contrast timing, patent. Review of the MIP images confirms the above findings CT Brain Perfusion Findings: CBF (<30%) Volume: 0mL  Perfusion (Tmax>6.0s) volume: 0mL Mismatch Volume: 0mL Infarction Location:None. IMPRESSION: No large vessel occlusion. Perfusion imaging demonstrates no evidence of core infarction or territory at risk. Primarily calcified plaque at the ICA origins causing approximately 50% stenosis on the right and less than 50% stenosis on the left. Mild stenosis of the intracranial carotids and left M1 MCA. Electronically Signed   By: Guadlupe Spanish M.D.   On: 07/24/2020 12:49   DG Chest 2 View  Result Date: 07/24/2020 CLINICAL DATA:  Stroke, potential discharge EXAM: CHEST - 2 VIEW COMPARISON:  06/06/2019 FINDINGS: Heart is normal size. Mild hyperinflation/COPD. No confluent opacities or effusions. Prior aortic valve repair. No acute bony abnormality. IMPRESSION: Hyperinflation/COPD.  No active disease. Electronically Signed   By: Charlett Nose M.D.   On: 07/24/2020 19:41   CT Angio Neck W and/or Wo Contrast  Result Date: 07/24/2020 CLINICAL DATA:  Left-sided weakness EXAM: CT ANGIOGRAPHY HEAD AND NECK CT PERFUSION BRAIN TECHNIQUE: Multidetector CT imaging of the head and neck was performed using the standard protocol during bolus administration of intravenous contrast. Multiplanar CT image reconstructions and MIPs were obtained to evaluate the vascular anatomy. Carotid stenosis measurements (when applicable) are obtained utilizing NASCET criteria, using the distal internal carotid diameter as the denominator. Multiphase CT imaging of the brain was performed following IV bolus contrast injection. Subsequent parametric perfusion maps were calculated using RAPID software. CONTRAST:  40mL OMNIPAQUE IOHEXOL 350 MG/ML SOLN, 60mL OMNIPAQUE IOHEXOL 350 MG/ML SOLN COMPARISON:  None. FINDINGS: CTA NECK FINDINGS Aortic arch: Great vessel origins are patent with calcified plaque present. Right carotid system: Patent. Mixed but primarily calcified plaque at the ICA origin causing approximately 50% stenosis. Left carotid system:  Patent. Mixed but primarily calcified plaque at the ICA origin causing less than 50% stenosis. Vertebral arteries: Patent. Right vertebral artery is dominant. There is calcified plaque at the left vertebral origin with potential significant stenosis. Skeleton: Degenerative changes of the cervical spine. Other neck: No mass or adenopathy. Upper chest: No apical lung mass Review of the MIP images confirms the above findings CTA HEAD FINDINGS Anterior circulation: Intracranial internal carotid arteries are patent with mild calcified plaque. Anterior and middle cerebral arteries are patent. Right A1 ACA is dominant. Mild stenosis of  the left M1 MCA. Posterior circulation: Intracranial vertebral arteries are patent with mild atherosclerotic irregularity. Patent PICA origins. Basilar artery is patent. Posterior cerebral arteries are patent. Large bilateral posterior communicating arteries. Venous sinuses: As permitted by contrast timing, patent. Review of the MIP images confirms the above findings CT Brain Perfusion Findings: CBF (<30%) Volume: 32mL Perfusion (Tmax>6.0s) volume: 29mL Mismatch Volume: 72mL Infarction Location:None. IMPRESSION: No large vessel occlusion. Perfusion imaging demonstrates no evidence of core infarction or territory at risk. Primarily calcified plaque at the ICA origins causing approximately 50% stenosis on the right and less than 50% stenosis on the left. Mild stenosis of the intracranial carotids and left M1 MCA. Electronically Signed   By: Guadlupe Spanish M.D.   On: 07/24/2020 12:49   MR ANGIO HEAD WO CONTRAST  Result Date: 07/24/2020 CLINICAL DATA:  Left-sided weakness noticed today. EXAM: MRI HEAD WITHOUT CONTRAST MRA HEAD WITHOUT CONTRAST TECHNIQUE: Multiplanar, multiecho pulse sequences of the brain and surrounding structures were obtained without intravenous contrast. Angiographic images of the head were obtained using MRA technique without contrast. COMPARISON:  CT studies same day.  FINDINGS: MRI HEAD FINDINGS Brain: Diffusion imaging shows acute infarction on the right affecting the external capsule and corona radiata region. No hemorrhage or mass effect. 2 acute punctate infarctions in the left frontal cortex region. Old small vessel cerebellar infarctions. Old small vessel infarction right pons. Wallerian degeneration on the right. Old infarction of the right caudate head and adjacent white matter tracks per moderate chronic small-vessel ischemic changes of the white matter elsewhere. No cortical or large vessel territory infarction. No sign of hemorrhage, hydrocephalus or extra-axial collection. Vascular: Major vessels at the base of the brain show flow. Skull and upper cervical spine: Negative Sinuses/Orbits: Clear/normal Other: Small left mastoid effusion. MRA HEAD FINDINGS Some motion degradation. Both internal carotid arteries are patent through the skull base and siphon regions. Both anterior and middle cerebral arteries are patent. Distal vessels not accurately evaluated because of motion. Right vertebral artery is widely patent to the basilar. Question abnormal slow or retrograde flow in the left vertebral artery. No basilar stenosis. Flow is present in the superior cerebellar and posterior cerebral arteries. Primary anterior circulation supply of the posterior cerebral arteries. Again, distal vessel detail is compromised by motion. IMPRESSION: Acute infarction affecting the right external capsule and corona radiata. No hemorrhage or mass effect. Two punctate acute infarctions in the left frontal cortex. Chronic small-vessel ischemic change elsewhere. Old infarction right caudate head and adjacent white matter. Motion degraded intracranial MR angiography does not show any large or medium vessel occlusion. Apparent slow or retrograde flow in the distal left vertebral artery, not confidently established because of motion. Electronically Signed   By: Paulina Fusi M.D.   On: 07/24/2020  15:23   MR BRAIN WO CONTRAST  Result Date: 07/24/2020 CLINICAL DATA:  Left-sided weakness noticed today. EXAM: MRI HEAD WITHOUT CONTRAST MRA HEAD WITHOUT CONTRAST TECHNIQUE: Multiplanar, multiecho pulse sequences of the brain and surrounding structures were obtained without intravenous contrast. Angiographic images of the head were obtained using MRA technique without contrast. COMPARISON:  CT studies same day. FINDINGS: MRI HEAD FINDINGS Brain: Diffusion imaging shows acute infarction on the right affecting the external capsule and corona radiata region. No hemorrhage or mass effect. 2 acute punctate infarctions in the left frontal cortex region. Old small vessel cerebellar infarctions. Old small vessel infarction right pons. Wallerian degeneration on the right. Old infarction of the right caudate head and adjacent white matter tracks  per moderate chronic small-vessel ischemic changes of the white matter elsewhere. No cortical or large vessel territory infarction. No sign of hemorrhage, hydrocephalus or extra-axial collection. Vascular: Major vessels at the base of the brain show flow. Skull and upper cervical spine: Negative Sinuses/Orbits: Clear/normal Other: Small left mastoid effusion. MRA HEAD FINDINGS Some motion degradation. Both internal carotid arteries are patent through the skull base and siphon regions. Both anterior and middle cerebral arteries are patent. Distal vessels not accurately evaluated because of motion. Right vertebral artery is widely patent to the basilar. Question abnormal slow or retrograde flow in the left vertebral artery. No basilar stenosis. Flow is present in the superior cerebellar and posterior cerebral arteries. Primary anterior circulation supply of the posterior cerebral arteries. Again, distal vessel detail is compromised by motion. IMPRESSION: Acute infarction affecting the right external capsule and corona radiata. No hemorrhage or mass effect. Two punctate acute  infarctions in the left frontal cortex. Chronic small-vessel ischemic change elsewhere. Old infarction right caudate head and adjacent white matter. Motion degraded intracranial MR angiography does not show any large or medium vessel occlusion. Apparent slow or retrograde flow in the distal left vertebral artery, not confidently established because of motion. Electronically Signed   By: Paulina Fusi M.D.   On: 07/24/2020 15:23   CT CEREBRAL PERFUSION W CONTRAST  Result Date: 07/24/2020 CLINICAL DATA:  Left-sided weakness EXAM: CT ANGIOGRAPHY HEAD AND NECK CT PERFUSION BRAIN TECHNIQUE: Multidetector CT imaging of the head and neck was performed using the standard protocol during bolus administration of intravenous contrast. Multiplanar CT image reconstructions and MIPs were obtained to evaluate the vascular anatomy. Carotid stenosis measurements (when applicable) are obtained utilizing NASCET criteria, using the distal internal carotid diameter as the denominator. Multiphase CT imaging of the brain was performed following IV bolus contrast injection. Subsequent parametric perfusion maps were calculated using RAPID software. CONTRAST:  40mL OMNIPAQUE IOHEXOL 350 MG/ML SOLN, 60mL OMNIPAQUE IOHEXOL 350 MG/ML SOLN COMPARISON:  None. FINDINGS: CTA NECK FINDINGS Aortic arch: Great vessel origins are patent with calcified plaque present. Right carotid system: Patent. Mixed but primarily calcified plaque at the ICA origin causing approximately 50% stenosis. Left carotid system: Patent. Mixed but primarily calcified plaque at the ICA origin causing less than 50% stenosis. Vertebral arteries: Patent. Right vertebral artery is dominant. There is calcified plaque at the left vertebral origin with potential significant stenosis. Skeleton: Degenerative changes of the cervical spine. Other neck: No mass or adenopathy. Upper chest: No apical lung mass Review of the MIP images confirms the above findings CTA HEAD FINDINGS  Anterior circulation: Intracranial internal carotid arteries are patent with mild calcified plaque. Anterior and middle cerebral arteries are patent. Right A1 ACA is dominant. Mild stenosis of the left M1 MCA. Posterior circulation: Intracranial vertebral arteries are patent with mild atherosclerotic irregularity. Patent PICA origins. Basilar artery is patent. Posterior cerebral arteries are patent. Large bilateral posterior communicating arteries. Venous sinuses: As permitted by contrast timing, patent. Review of the MIP images confirms the above findings CT Brain Perfusion Findings: CBF (<30%) Volume: 0mL Perfusion (Tmax>6.0s) volume: 0mL Mismatch Volume: 0mL Infarction Location:None. IMPRESSION: No large vessel occlusion. Perfusion imaging demonstrates no evidence of core infarction or territory at risk. Primarily calcified plaque at the ICA origins causing approximately 50% stenosis on the right and less than 50% stenosis on the left. Mild stenosis of the intracranial carotids and left M1 MCA. Electronically Signed   By: Guadlupe Spanish M.D.   On: 07/24/2020 12:49   ECHOCARDIOGRAM  COMPLETE  Result Date: 07/25/2020    ECHOCARDIOGRAM REPORT   Patient Name:   Catherine Anthony Date of Exam: 07/25/2020 Medical Rec #:  [id number]       Height:       64.0 in Accession #:    [id number]      Weight:       92.4 lb Date of Birth:  1930-03-13       BSA:          1.409 m Patient Age:    89 years        BP:           167/74 mmHg Patient Gender: F               HR:           60 bpm. [facility] Procedure: 2D Echo Indications:    stroke 434.91  History:        Patient has prior history of Echocardiogram examinations, most                 recent 05/09/2020. CAD; Risk Factors:Hypertension and                 Dyslipidemia. S/p TAVR. PAF. Hx of severe AS.  Sonographer:    Celene Skeen RDCS (AE) Referring Phys: [phone] DAVID TAT  Sonographer Comments: Suboptimal parasternal window. IMPRESSIONS  1. Left ventricular  ejection fraction, by estimation, is 65 to 70%. The left ventricle has normal function. The left ventricle has no regional wall motion abnormalities. Left ventricular diastolic parameters are consistent with Grade I diastolic dysfunction (impaired relaxation). Elevated left atrial pressure.  2. Right ventricular systolic function was not well visualized. The right ventricular size is not well visualized. There is mildly elevated pulmonary artery systolic pressure.  3. The mitral valve is normal in structure. Mild mitral valve regurgitation. No evidence of mitral stenosis.  4. Edwards Sapien 3 Ultra THV size 23 mm. The aortic valve was not well visualized. Aortic valve regurgitation is not visualized. No aortic stenosis is present. FINDINGS  Left Ventricle: Left ventricular ejection fraction, by estimation, is 65 to 70%. The left ventricle has normal function. The left ventricle has no regional wall motion abnormalities. The left ventricular internal cavity size was normal in size. There is  no left ventricular hypertrophy. Left ventricular diastolic parameters are consistent with Grade I diastolic dysfunction (impaired relaxation). Elevated left atrial pressure. Right Ventricle: The right ventricular size is not well visualized. Right vetricular wall thickness was not assessed. Right ventricular systolic function was not well visualized. There is mildly elevated pulmonary artery systolic pressure. The tricuspid regurgitant velocity is 2.92 m/s, and with an assumed right atrial pressure of 8 mmHg, the estimated right ventricular systolic pressure is 42.1 mmHg. Left Atrium: Left atrial size was normal in size. Right Atrium: Right atrial size was not well visualized. Pericardium: There is no evidence of pericardial effusion. Mitral Valve: The mitral valve is normal in structure. Moderate mitral annular calcification. Mild mitral valve regurgitation. No evidence of mitral valve stenosis. Tricuspid Valve: The tricuspid  valve is not well visualized. Tricuspid valve regurgitation is not demonstrated. No evidence of tricuspid stenosis. Aortic Valve: Edwards Sapien 3 Ultra THV size 23 mm. The aortic valve was not well visualized. Aortic valve regurgitation is not visualized. No aortic stenosis is present. Aortic valve mean gradient measures 5.2 mmHg. Aortic valve peak gradient measures 8.6 mmHg. Aortic valve area, by VTI measures 1.77 cm.  Pulmonic Valve: The pulmonic valve was not well visualized. Pulmonic valve regurgitation is not visualized. No evidence of pulmonic stenosis. Aorta: The aortic root is normal in size and structure. Pulmonary Artery: Indeterminant PASP, IVC poorly visualized. Venous: The inferior vena cava was not well visualized. IAS/Shunts: The interatrial septum was not well visualized.  LEFT VENTRICLE PLAX 2D LVIDd:         3.40 cm  Diastology LVIDs:         2.10 cm  LV e' lateral:   5.87 cm/s LV PW:         0.88 cm  LV E/e' lateral: 15.8 LV IVS:        1.12 cm  LV e' medial:    4.79 cm/s LVOT diam:     1.80 cm  LV E/e' medial:  19.3 LV SV:         59 LV SV Index:   42 LVOT Area:     2.54 cm  LEFT ATRIUM             Index LA diam:        2.50 cm 1.77 cm/m LA Vol (A2C):   16.8 ml 11.92 ml/m LA Vol (A4C):   28.8 ml 20.44 ml/m LA Biplane Vol: 24.4 ml 17.32 ml/m  AORTIC VALVE AV Area (Vmax):    1.75 cm AV Area (Vmean):   1.59 cm AV Area (VTI):     1.77 cm AV Vmax:           146.60 cm/s AV Vmean:          108.005 cm/s AV VTI:            0.331 m AV Peak Grad:      8.6 mmHg AV Mean Grad:      5.2 mmHg LVOT Vmax:         101.00 cm/s LVOT Vmean:        67.500 cm/s LVOT VTI:          0.230 m LVOT/AV VTI ratio: 0.70  AORTA Ao Root diam: 2.50 cm MITRAL VALVE                TRICUSPID VALVE MV Area (PHT): 1.63 cm     TR Peak grad:   34.1 mmHg MV Decel Time: 466 msec     TR Vmax:        292.00 cm/s MV E velocity: 92.60 cm/s MV A velocity: 133.00 cm/s  SHUNTS MV E/A ratio:  0.70         Systemic VTI:  0.23 m                              Systemic Diam: 1.80 cm Dina Rich MD Electronically signed by Dina Rich MD Signature Date/Time: 07/25/2020/1:07:29 PM    Final    CT HEAD CODE STROKE WO CONTRAST  Result Date: 07/24/2020 CLINICAL DATA:  Code stroke.  Left-sided weakness EXAM: CT HEAD WITHOUT CONTRAST TECHNIQUE: Contiguous axial images were obtained from the base of the skull through the vertex without intravenous contrast. COMPARISON:  None. FINDINGS: Brain: There is no acute intracranial hemorrhage, mass effect, or edema. There is no acute appearing loss of gray-white differentiation. Prominence of the ventricles and sulci reflects generalized parenchymal volume loss. There is ex vacuo dilatation of the frontal horn of the right lateral ventricle contiguous with an infarct. Patchy and confluent areas of hypoattenuation in the supratentorial white matter  are nonspecific but probably reflect moderate chronic microvascular ischemic changes. There are age-indeterminate small vessel infarcts of the right cerebellum. Vascular: Mild focal hyperdensity along a proximal right M2 MCA branch. There is intracranial atherosclerotic calcification at the skull base. Skull: Unremarkable. Sinuses/Orbits: No acute finding. Other: Mastoid air cells are clear. ASPECTS (Alberta Stroke Program Early CT Score) - Ganglionic level infarction (caudate, lentiform nuclei, internal capsule, insula, M1-M3 cortex): 7 - Supraganglionic infarction (M4-M6 cortex): 3 Total score (0-10 with 10 being normal): 10 IMPRESSION: No acute intracranial hemorrhage or evidence of acute infarction. ASPECT score is 10. Age-indeterminate small vessel infarcts of the right cerebellum. Focus of mild hyperdensity along a proximal right M2 MCA branch may be artifactual. Correlation will be made with forthcoming CT vascular imaging. These results were called by telephone at the time of interpretation on 07/24/2020 at 12:22 pm to provider [facility] , who verbally  acknowledged these results. Electronically Signed   By: Guadlupe Spanish M.D.   On: 07/24/2020 12:27    Assessment/Plan  1. Acute ischemic stroke (HCC) -  Follow up with neurology - has left hemiplegia, for Home health PT, OT, ST and Nurse - apixaban (ELIQUIS) 2.5 MG TABS tablet; TAKE (1) TABLET BY MOUTH TWICE DAILY.  Dispense: 60 tablet; Refill: 0 - rosuvastatin (CRESTOR) 20 MG tablet; TAKE (1) TABLET BY MOUTH ONCE DAILY.  Dispense: 30 tablet; Refill: 0  2. Essential hypertension - hydrALAZINE (APRESOLINE) 25 MG tablet; Take 1 tablet (25 mg total) by mouth 3 (three) times daily.  Dispense: 90 tablet; Refill: 0 - lisinopril (ZESTRIL) 40 MG tablet; Take 1 tablet (40 mg total) by mouth daily.  Dispense: 30 tablet; Refill: 0 - metoprolol tartrate (LOPRESSOR) 25 MG tablet; TAKE (1) TABLET BY MOUTH TWICE DAILY.  Dispense: 60 tablet; Refill: 0  3. PAF (paroxysmal atrial fibrillation) (HCC) - amiodarone (PACERONE) 200 MG tablet; Take 1 tablet (200 mg total) by mouth as directed. Monday and Friday  Dispense: 8 tablet; Refill: 0 - apixaban (ELIQUIS) 2.5 MG TABS tablet; TAKE (1) TABLET BY MOUTH TWICE DAILY.  Dispense: 60 tablet; Refill: 0 - nitroGLYCERIN (NITROSTAT) 0.4 MG SL tablet; Place 1 tablet (0.4 mg total) under the tongue every 5 (five) minutes x 3 doses as needed for chest pain.  Dispense: 25 tablet; Refill: 0  4. Hypokalemia Lab Results  Component Value Date   K 3.1 (L) 07/25/2020   - was repleted   5. Chronic diastolic heart failure (HCC) - no SOB, stable - furosemide (LASIX) 20 MG tablet; Take 1 tablet (20 mg total) by mouth daily.  Dispense: 30 tablet; Refill: 0 - hydrALAZINE (APRESOLINE) 25 MG tablet; Take 1 tablet (25 mg total) by mouth 3 (three) times daily.  Dispense: 90 tablet; Refill: 0 - metoprolol tartrate (LOPRESSOR) 25 MG tablet; TAKE (1) TABLET BY MOUTH TWICE DAILY.  Dispense: 60 tablet; Refill: 0  6. Poor appetite - mirtazapine (REMERON) 15 MG tablet; Take 0.5 tablets  (7.5 mg total) by mouth at bedtime.  Dispense: 15 tablet; Refill: 0    I have filled out patient's discharge paperwork and written prescriptions.  Patient will have home health PT, OT, ST and Nurse for medication management.   DME provided: shower bench, wheelchair and 3-in-1  Wheelchair -  She had an acute ischemic/embolic stroke resulting in her having left hemiplegia which impairs her ability to perform daily activities like toileting, dressing, grooming and bathing in the home. A cane or walker will not resolve the issue with performing activities of  daily living. A wheelchair will allow resident to safely perform daily activities. She has a caregiver who can provide assistance.   Total discharge time: Greater than 30 minutes Greater than 50% was spent in counseling and coordination of care.    Discharge time involved coordination of the discharge process with social worker, nursing staff and therapy department. Medical justification for home health services/DME verified.    Kenard Gower, DNP, MSN, FNP-BC [facility] [phone] (Monday-Friday 8:00 a.m. - 5:00 p.m.) [phone] (after hours)

## 2020-08-03 DIAGNOSIS — M150 Primary generalized (osteo)arthritis: Secondary | ICD-10-CM | POA: Diagnosis not present

## 2020-08-03 DIAGNOSIS — I5030 Unspecified diastolic (congestive) heart failure: Secondary | ICD-10-CM | POA: Diagnosis not present

## 2020-08-03 DIAGNOSIS — Z743 Need for continuous supervision: Secondary | ICD-10-CM | POA: Diagnosis not present

## 2020-08-03 DIAGNOSIS — I2510 Atherosclerotic heart disease of native coronary artery without angina pectoris: Secondary | ICD-10-CM | POA: Diagnosis not present

## 2020-08-03 DIAGNOSIS — R1310 Dysphagia, unspecified: Secondary | ICD-10-CM | POA: Diagnosis not present

## 2020-08-03 DIAGNOSIS — I480 Paroxysmal atrial fibrillation: Secondary | ICD-10-CM | POA: Diagnosis not present

## 2020-08-03 DIAGNOSIS — Z79899 Other long term (current) drug therapy: Secondary | ICD-10-CM | POA: Diagnosis not present

## 2020-08-03 DIAGNOSIS — L89606 Pressure-induced deep tissue damage of unspecified heel: Secondary | ICD-10-CM | POA: Diagnosis not present

## 2020-08-03 DIAGNOSIS — Z7901 Long term (current) use of anticoagulants: Secondary | ICD-10-CM | POA: Diagnosis not present

## 2020-08-03 DIAGNOSIS — I340 Nonrheumatic mitral (valve) insufficiency: Secondary | ICD-10-CM | POA: Diagnosis not present

## 2020-08-03 DIAGNOSIS — I130 Hypertensive heart and chronic kidney disease with heart failure and stage 1 through stage 4 chronic kidney disease, or unspecified chronic kidney disease: Secondary | ICD-10-CM | POA: Diagnosis not present

## 2020-08-03 DIAGNOSIS — I69354 Hemiplegia and hemiparesis following cerebral infarction affecting left non-dominant side: Secondary | ICD-10-CM | POA: Diagnosis not present

## 2020-08-03 DIAGNOSIS — I69991 Dysphagia following unspecified cerebrovascular disease: Secondary | ICD-10-CM | POA: Diagnosis not present

## 2020-08-03 DIAGNOSIS — Z741 Need for assistance with personal care: Secondary | ICD-10-CM | POA: Diagnosis not present

## 2020-08-03 DIAGNOSIS — N1832 Chronic kidney disease, stage 3b: Secondary | ICD-10-CM | POA: Diagnosis not present

## 2020-08-03 DIAGNOSIS — Z9181 History of falling: Secondary | ICD-10-CM | POA: Diagnosis not present

## 2020-08-07 DIAGNOSIS — I5030 Unspecified diastolic (congestive) heart failure: Secondary | ICD-10-CM | POA: Diagnosis not present

## 2020-08-07 DIAGNOSIS — N1832 Chronic kidney disease, stage 3b: Secondary | ICD-10-CM | POA: Diagnosis not present

## 2020-08-07 DIAGNOSIS — I69354 Hemiplegia and hemiparesis following cerebral infarction affecting left non-dominant side: Secondary | ICD-10-CM | POA: Diagnosis not present

## 2020-08-07 DIAGNOSIS — I69991 Dysphagia following unspecified cerebrovascular disease: Secondary | ICD-10-CM | POA: Diagnosis not present

## 2020-08-07 DIAGNOSIS — I130 Hypertensive heart and chronic kidney disease with heart failure and stage 1 through stage 4 chronic kidney disease, or unspecified chronic kidney disease: Secondary | ICD-10-CM | POA: Diagnosis not present

## 2020-08-07 DIAGNOSIS — I480 Paroxysmal atrial fibrillation: Secondary | ICD-10-CM | POA: Diagnosis not present

## 2020-08-08 DIAGNOSIS — I130 Hypertensive heart and chronic kidney disease with heart failure and stage 1 through stage 4 chronic kidney disease, or unspecified chronic kidney disease: Secondary | ICD-10-CM | POA: Diagnosis not present

## 2020-08-08 DIAGNOSIS — I480 Paroxysmal atrial fibrillation: Secondary | ICD-10-CM | POA: Diagnosis not present

## 2020-08-08 DIAGNOSIS — I69991 Dysphagia following unspecified cerebrovascular disease: Secondary | ICD-10-CM | POA: Diagnosis not present

## 2020-08-08 DIAGNOSIS — I5030 Unspecified diastolic (congestive) heart failure: Secondary | ICD-10-CM | POA: Diagnosis not present

## 2020-08-08 DIAGNOSIS — I69354 Hemiplegia and hemiparesis following cerebral infarction affecting left non-dominant side: Secondary | ICD-10-CM | POA: Diagnosis not present

## 2020-08-08 DIAGNOSIS — N1832 Chronic kidney disease, stage 3b: Secondary | ICD-10-CM | POA: Diagnosis not present

## 2020-08-12 ENCOUNTER — Other Ambulatory Visit: Payer: Self-pay

## 2020-08-12 ENCOUNTER — Emergency Department (HOSPITAL_COMMUNITY): Payer: Medicare Other

## 2020-08-12 ENCOUNTER — Encounter (HOSPITAL_COMMUNITY): Payer: Self-pay | Admitting: Emergency Medicine

## 2020-08-12 ENCOUNTER — Inpatient Hospital Stay (HOSPITAL_COMMUNITY)
Admission: EM | Admit: 2020-08-12 | Discharge: 2020-08-15 | DRG: 871 | Disposition: A | Discharge status: Home Health | Payer: Medicare Other | Attending: Internal Medicine | Admitting: Internal Medicine

## 2020-08-12 DIAGNOSIS — Z952 Presence of prosthetic heart valve: Secondary | ICD-10-CM | POA: Diagnosis not present

## 2020-08-12 DIAGNOSIS — R9431 Abnormal electrocardiogram [ECG] [EKG]: Secondary | ICD-10-CM

## 2020-08-12 DIAGNOSIS — I10 Essential (primary) hypertension: Secondary | ICD-10-CM | POA: Diagnosis not present

## 2020-08-12 DIAGNOSIS — Z7901 Long term (current) use of anticoagulants: Secondary | ICD-10-CM | POA: Diagnosis not present

## 2020-08-12 DIAGNOSIS — I2510 Atherosclerotic heart disease of native coronary artery without angina pectoris: Secondary | ICD-10-CM | POA: Diagnosis present

## 2020-08-12 DIAGNOSIS — J449 Chronic obstructive pulmonary disease, unspecified: Secondary | ICD-10-CM | POA: Diagnosis not present

## 2020-08-12 DIAGNOSIS — N1832 Chronic kidney disease, stage 3b: Secondary | ICD-10-CM | POA: Diagnosis present

## 2020-08-12 DIAGNOSIS — E872 Acidosis, unspecified: Secondary | ICD-10-CM

## 2020-08-12 DIAGNOSIS — R0689 Other abnormalities of breathing: Secondary | ICD-10-CM | POA: Diagnosis not present

## 2020-08-12 DIAGNOSIS — R739 Hyperglycemia, unspecified: Secondary | ICD-10-CM | POA: Diagnosis present

## 2020-08-12 DIAGNOSIS — I480 Paroxysmal atrial fibrillation: Secondary | ICD-10-CM | POA: Diagnosis present

## 2020-08-12 DIAGNOSIS — R531 Weakness: Secondary | ICD-10-CM

## 2020-08-12 DIAGNOSIS — L0390 Cellulitis, unspecified: Secondary | ICD-10-CM

## 2020-08-12 DIAGNOSIS — S91302A Unspecified open wound, left foot, initial encounter: Secondary | ICD-10-CM

## 2020-08-12 DIAGNOSIS — N179 Acute kidney failure, unspecified: Secondary | ICD-10-CM | POA: Diagnosis present

## 2020-08-12 DIAGNOSIS — E860 Dehydration: Secondary | ICD-10-CM | POA: Diagnosis present

## 2020-08-12 DIAGNOSIS — Z20822 Contact with and (suspected) exposure to covid-19: Secondary | ICD-10-CM | POA: Diagnosis present

## 2020-08-12 DIAGNOSIS — K529 Noninfective gastroenteritis and colitis, unspecified: Secondary | ICD-10-CM | POA: Diagnosis present

## 2020-08-12 DIAGNOSIS — Z79899 Other long term (current) drug therapy: Secondary | ICD-10-CM

## 2020-08-12 DIAGNOSIS — E782 Mixed hyperlipidemia: Secondary | ICD-10-CM | POA: Diagnosis present

## 2020-08-12 DIAGNOSIS — I248 Other forms of acute ischemic heart disease: Secondary | ICD-10-CM | POA: Diagnosis present

## 2020-08-12 DIAGNOSIS — R6520 Severe sepsis without septic shock: Secondary | ICD-10-CM | POA: Diagnosis present

## 2020-08-12 DIAGNOSIS — I959 Hypotension, unspecified: Secondary | ICD-10-CM | POA: Diagnosis not present

## 2020-08-12 DIAGNOSIS — R778 Other specified abnormalities of plasma proteins: Secondary | ICD-10-CM

## 2020-08-12 DIAGNOSIS — S90922A Unspecified superficial injury of left foot, initial encounter: Secondary | ICD-10-CM | POA: Diagnosis present

## 2020-08-12 DIAGNOSIS — E876 Hypokalemia: Secondary | ICD-10-CM | POA: Diagnosis present

## 2020-08-12 DIAGNOSIS — I5032 Chronic diastolic (congestive) heart failure: Secondary | ICD-10-CM | POA: Diagnosis present

## 2020-08-12 DIAGNOSIS — I491 Atrial premature depolarization: Secondary | ICD-10-CM | POA: Diagnosis not present

## 2020-08-12 DIAGNOSIS — I130 Hypertensive heart and chronic kidney disease with heart failure and stage 1 through stage 4 chronic kidney disease, or unspecified chronic kidney disease: Secondary | ICD-10-CM | POA: Diagnosis present

## 2020-08-12 DIAGNOSIS — T68XXXA Hypothermia, initial encounter: Secondary | ICD-10-CM

## 2020-08-12 DIAGNOSIS — Z955 Presence of coronary angioplasty implant and graft: Secondary | ICD-10-CM | POA: Diagnosis not present

## 2020-08-12 DIAGNOSIS — X58XXXA Exposure to other specified factors, initial encounter: Secondary | ICD-10-CM | POA: Diagnosis present

## 2020-08-12 DIAGNOSIS — R197 Diarrhea, unspecified: Secondary | ICD-10-CM | POA: Diagnosis not present

## 2020-08-12 DIAGNOSIS — I69991 Dysphagia following unspecified cerebrovascular disease: Secondary | ICD-10-CM | POA: Diagnosis not present

## 2020-08-12 DIAGNOSIS — Z681 Body mass index (BMI) 19 or less, adult: Secondary | ICD-10-CM

## 2020-08-12 DIAGNOSIS — I5030 Unspecified diastolic (congestive) heart failure: Secondary | ICD-10-CM | POA: Diagnosis not present

## 2020-08-12 DIAGNOSIS — I639 Cerebral infarction, unspecified: Secondary | ICD-10-CM | POA: Diagnosis not present

## 2020-08-12 DIAGNOSIS — R0902 Hypoxemia: Secondary | ICD-10-CM | POA: Diagnosis not present

## 2020-08-12 DIAGNOSIS — E43 Unspecified severe protein-calorie malnutrition: Secondary | ICD-10-CM | POA: Diagnosis present

## 2020-08-12 DIAGNOSIS — I4891 Unspecified atrial fibrillation: Secondary | ICD-10-CM | POA: Diagnosis present

## 2020-08-12 DIAGNOSIS — I69354 Hemiplegia and hemiparesis following cerebral infarction affecting left non-dominant side: Secondary | ICD-10-CM | POA: Diagnosis not present

## 2020-08-12 DIAGNOSIS — M7732 Calcaneal spur, left foot: Secondary | ICD-10-CM | POA: Diagnosis not present

## 2020-08-12 DIAGNOSIS — L03116 Cellulitis of left lower limb: Secondary | ICD-10-CM | POA: Diagnosis not present

## 2020-08-12 DIAGNOSIS — A419 Sepsis, unspecified organism: Principal | ICD-10-CM | POA: Diagnosis present

## 2020-08-12 DIAGNOSIS — R000 Tachycardia, unspecified: Secondary | ICD-10-CM | POA: Diagnosis not present

## 2020-08-12 DIAGNOSIS — R0600 Dyspnea, unspecified: Secondary | ICD-10-CM | POA: Diagnosis not present

## 2020-08-12 DIAGNOSIS — R112 Nausea with vomiting, unspecified: Secondary | ICD-10-CM

## 2020-08-12 LAB — BASIC METABOLIC PANEL
Anion gap: 19 — ABNORMAL HIGH (ref 5–15)
BUN: 36 mg/dL — ABNORMAL HIGH (ref 8–23)
CO2: 21 mmol/L — ABNORMAL LOW (ref 22–32)
Calcium: 9.5 mg/dL (ref 8.9–10.3)
Chloride: 101 mmol/L (ref 98–111)
Creatinine, Ser: 2.6 mg/dL — ABNORMAL HIGH (ref 0.44–1.00)
GFR calc Af Amer: 18 mL/min — ABNORMAL LOW (ref >60)
GFR calc non Af Amer: 16 mL/min — ABNORMAL LOW (ref >60)
Glucose, Bld: 168 mg/dL — ABNORMAL HIGH (ref 70–99)
Potassium: 2.4 mmol/L — CL (ref 3.5–5.1)
Sodium: 141 mmol/L (ref 135–145)

## 2020-08-12 LAB — CBC
HCT: 38.9 % (ref 36.0–46.0)
Hemoglobin: 12.9 g/dL (ref 12.0–15.0)
MCH: 33.1 pg (ref 26.0–34.0)
MCHC: 33.2 g/dL (ref 30.0–36.0)
MCV: 99.7 fL (ref 80.0–100.0)
Platelets: 327 10*3/uL (ref 150–400)
RBC: 3.9 MIL/uL (ref 3.87–5.11)
RDW: 13.2 % (ref 11.5–15.5)
WBC: 9.9 10*3/uL (ref 4.0–10.5)
nRBC: 0 % (ref 0.0–0.2)

## 2020-08-12 LAB — LACTIC ACID, PLASMA: Lactic Acid, Venous: 4.7 mmol/L (ref 0.5–1.9)

## 2020-08-12 LAB — TROPONIN I (HIGH SENSITIVITY): Troponin I (High Sensitivity): 63 ng/L — ABNORMAL HIGH (ref <18)

## 2020-08-12 LAB — CBG MONITORING, ED: Glucose-Capillary: 155 mg/dL — ABNORMAL HIGH (ref 70–99)

## 2020-08-12 MED ORDER — POTASSIUM CHLORIDE 10 MEQ/100ML IV SOLN
10.0000 meq | INTRAVENOUS | Status: AC
  Administered 2020-08-12 – 2020-08-13 (×4): 10 meq via INTRAVENOUS
  Filled 2020-08-12 (×4): qty 100

## 2020-08-12 MED ORDER — VANCOMYCIN HCL IN DEXTROSE 1-5 GM/200ML-% IV SOLN
1000.0000 mg | Freq: Once | INTRAVENOUS | Status: AC
  Administered 2020-08-12: 1000 mg via INTRAVENOUS
  Filled 2020-08-12: qty 200

## 2020-08-12 MED ORDER — LACTATED RINGERS IV BOLUS
1000.0000 mL | Freq: Once | INTRAVENOUS | Status: AC
  Administered 2020-08-12: 1000 mL via INTRAVENOUS

## 2020-08-12 MED ORDER — POTASSIUM CHLORIDE CRYS ER 20 MEQ PO TBCR
40.0000 meq | EXTENDED_RELEASE_TABLET | Freq: Once | ORAL | Status: AC
  Administered 2020-08-13: 40 meq via ORAL
  Filled 2020-08-12: qty 2

## 2020-08-12 NOTE — ED Notes (Signed)
Patient pale and diaphoretic at this time.

## 2020-08-12 NOTE — ED Triage Notes (Signed)
Patient brought in by [facility] for emesis, nausea and patient states she has not been able to eat today. Patient does have left sided weakness from a previous CVA. Patient alert and oriented x 4.

## 2020-08-12 NOTE — ED Notes (Signed)
CRITICAL VALUE ALERT  Critical Value:  Lactic 4.7  Date & Time Notied:  08/12/2020 @ 2329  Provider Notified: Dr. Juleen China  Orders Received/Actions taken: no/na

## 2020-08-12 NOTE — H&P (Addendum)
History and Physical  Catherine Anthony:[id number] DOB: Jul 29, 1930 DOA: 08/12/2020  Referring physician: Raeford Razor, MD PCP: [name], MD  Patient coming from: Home  Chief Complaint: Vomiting and diarrhea  HPI: Catherine Anthony is a 86 y.o. female with medical history significant for hypertension, paroxysmal atrial fibrillation, s/p TAVR July 2020, hyperlipidemia, diastolic CHF and recent CVA with left-sided weakness who presents to the emergency department via EMS due to several days of generalized weakness.  Patient states that she has had decreased appetite for the last couple of days, she endorsed several episodes of diarrhea yesterday (but this has since resolved) and she admitted to nausea and nonbloody vomiting x1 today.  Patient has several days of left heel superficial wound which she has been taking care of at home.  She was recently discharged from this facility in August of this year due to acute ischemic/embolic stroke.  Patient states that daughter thought that she looked sick, so she activated 911 for her  to be taken to the ED for further evaluation and management.  She denies chest pain, shortness of breath, fever, headache or blurry vision.  Patient states that she has not had any of the Covid vaccines.  ED Course:  In the emergency department, she was hypothermic (82.9) and BP was soft.  Work-up in the ED showed normal CBC, hypokalemia, BUN to creatinine 36/2.60, hyperglycemia, lactic acid 4.7, troponin x1- 63.  SARS coronavirus was negative.  Chest x-ray showed COPD without acute airspace disease.  IV hydration was provided, potassium was replenished and patient was empirically treated with IV vancomycin.  Hospitalist was asked to admit patient for further evaluation and management.  Review of Systems: Constitutional: Negative for chills and fever.  HENT: Negative for ear pain and sore throat.   Eyes: Negative for pain and visual disturbance.  Respiratory: Negative for  cough, chest tightness and shortness of breath.   Cardiovascular: Negative for chest pain and palpitations.  Gastrointestinal: Positive for nausea, vomiting and diarrhea.   Endocrine: Negative for polyphagia and polyuria.  Genitourinary: Negative for decreased urine volume, dysuria Musculoskeletal: Negative for arthralgias and back pain.  Skin: Negative for color change and rash.  Allergic/Immunologic: Negative for immunocompromised state.  Neurological: Positive for left-sided weakness.  Negative for tremors, syncope, speech difficulty Hematological: Does not bruise/bleed easily.  All other systems reviewed and are negative   Past Medical History:  Diagnosis Date  . Arthritis    Right hand  . Complication of anesthesia   . Coronary artery disease    s/p mutliple PCIs  . Dyslipidemia   . Family hx of colon cancer   . History of kidney stones   . Hypertension   . PAF (paroxysmal atrial fibrillation) (HCC)    on Eliquis  . S/P TAVR (transcatheter aortic valve replacement)   . Severe aortic stenosis    Past Surgical History:  Procedure Laterality Date  . CATARACT EXTRACTION W/ INTRAOCULAR LENS IMPLANT Right 06/04/2011   [facility]  . CATARACT EXTRACTION W/PHACO  06/22/2011   Procedure: CATARACT EXTRACTION PHACO AND INTRAOCULAR LENS PLACEMENT (IOC);  Surgeon: Gemma Payor;  Location: [facility];  Service: Ophthalmology;  Laterality: Left;  CDE 14.34  . COLONOSCOPY  06/23/08  . COLONOSCOPY N/A 01/18/2014   Procedure: COLONOSCOPY;  Surgeon: Malissa Hippo, MD;  Location: [facility];  Service: Endoscopy;  Laterality: N/A;  145  . CORONARY ANGIOPLASTY WITH STENT PLACEMENT    . CORONARY ATHERECTOMY N/A 09/21/2017   Procedure: CORONARY ATHERECTOMY;  Surgeon: Elder Negus, MD;  Location: [facility] INVASIVE CV LAB;  Service: Cardiovascular;  Laterality: N/A;  . CORONARY STENT INTERVENTION N/A 08/31/2017   Procedure: CORONARY STENT INTERVENTION;  Surgeon: Elder Negus, MD;  Location: [facility]  INVASIVE CV LAB;  Service: Cardiovascular;  Laterality: N/A;  . CORONARY STENT INTERVENTION N/A 09/21/2017   Procedure: CORONARY STENT INTERVENTION;  Surgeon: Elder Negus, MD;  Location: [facility] INVASIVE CV LAB;  Service: Cardiovascular;  Laterality: N/A;  . EYE SURGERY Bilateral    "after cataract OR"  . INTRAVASCULAR ULTRASOUND/IVUS N/A 09/21/2017   Procedure: Intravascular Ultrasound/IVUS;  Surgeon: Elder Negus, MD;  Location: [facility] INVASIVE CV LAB;  Service: Cardiovascular;  Laterality: N/A;  . RIGHT/LEFT HEART CATH AND CORONARY ANGIOGRAPHY N/A 08/31/2017   Procedure: RIGHT/LEFT HEART CATH AND CORONARY ANGIOGRAPHY;  Surgeon: Elder Negus, MD;  Location: [facility] INVASIVE CV LAB;  Service: Cardiovascular;  Laterality: N/A;  . RIGHT/LEFT HEART CATH AND CORONARY ANGIOGRAPHY N/A 05/16/2019   Procedure: RIGHT/LEFT HEART CATH AND CORONARY ANGIOGRAPHY;  Surgeon: Elder Negus, MD;  Location: [facility]VASIVE CV LAB;  Service: Cardiovascular;  Laterality: N/A;  . TEE WITHOUT CARDIOVERSION N/A 06/06/2019   Procedure: TRANSESOPHAGEAL ECHOCARDIOGRAM (TEE);  Surgeon: Tonny Bollman, MD;  Location: [facility] INVASIVE CV LAB;  Service: Open Heart Surgery;  Laterality: N/A;  . TRANSCATHETER AORTIC VALVE REPLACEMENT, TRANSFEMORAL N/A 06/06/2019   Procedure: TRANSCATHETER AORTIC VALVE REPLACEMENT, TRANSFEMORAL;  Surgeon: Tonny Bollman, MD;  Location: [facility] INVASIVE CV LAB;  Service: Open Heart Surgery;  Laterality: N/A;  . ULTRASOUND GUIDANCE FOR VASCULAR ACCESS  09/21/2017   Procedure: Ultrasound Guidance For Vascular Access;  Surgeon: Elder Negus, MD;  Location: [facility] INVASIVE CV LAB;  Service: Cardiovascular;;  . VARICOSE VEIN SURGERY Bilateral 1960s   [facility]    Social History:  reports that she has never smoked. She has never used smokeless tobacco. She reports that she does not drink alcohol and does not use drugs.   Allergies  Allergen Reactions  . Codeine Nausea And Vomiting    Family  History  Problem Relation Age of Onset  . Hypertension Mother     Prior to Admission medications   Medication Sig Start Date End Date Taking? Authorizing Provider  acetaminophen (TYLENOL) 325 MG tablet Take 650 mg by mouth every 6 (six) hours as needed.    [provider]  amiodarone (PACERONE) 200 MG tablet Take 1 tablet (200 mg total) by mouth as directed. Monday and Friday 08/01/20   Medina-Vargas, Monina C, NP  apixaban (ELIQUIS) 2.5 MG TABS tablet TAKE (1) TABLET BY MOUTH TWICE DAILY. 08/01/20   Medina-Vargas, Monina C, NP  Cholecalciferol (VITAMIN D3) 50 MCG (2000 UT) TABS Take 2 tablets by mouth daily.     [provider]  furosemide (LASIX) 20 MG tablet Take 1 tablet (20 mg total) by mouth daily. 08/01/20 08/31/20  Medina-Vargas, Monina C, NP  hydrALAZINE (APRESOLINE) 25 MG tablet Take 1 tablet (25 mg total) by mouth 3 (three) times daily. 08/01/20 08/31/20  Medina-Vargas, Monina C, NP  lisinopril (ZESTRIL) 40 MG tablet Take 1 tablet (40 mg total) by mouth daily. 08/01/20   Medina-Vargas, Monina C, NP  metoprolol tartrate (LOPRESSOR) 25 MG tablet TAKE (1) TABLET BY MOUTH TWICE DAILY. 08/01/20   Medina-Vargas, Monina C, NP  mirtazapine (REMERON) 15 MG tablet Take 0.5 tablets (7.5 mg total) by mouth at bedtime. 08/01/20   Medina-Vargas, Monina C, NP  nitroGLYCERIN (NITROSTAT) 0.4 MG SL tablet Place 1 tablet (0.4 mg total)  under the tongue every 5 (five) minutes x 3 doses as needed for chest pain. 08/01/20   Medina-Vargas, Monina C, NP  Nutritional Supplements (NUTRITIONAL SUPPLEMENT PO) Take 1 each by mouth in the morning and at bedtime. Manufacturing engineer, Historical, MD  rosuvastatin (CRESTOR) 20 MG tablet TAKE (1) TABLET BY MOUTH ONCE DAILY. 08/01/20   Medina-Vargas, Monina C, NP    Physical Exam: BP (!) 92/45   Pulse 66   Temp (!) 92.9 F (33.8 C) (Rectal)   Resp 16   Ht 5\' 4"  (1.626 m)   Wt 40.8 kg   SpO2 98%   BMI 15.45 kg/m   . General: 86 y.o. year-old female  cachectic, frail, but  in no acute distress.  Alert and oriented x3. 98 HEENT: Dry mucous membrane.  NCAT, EOMI . Neck: Supple, trachea midline . Cardiovascular: Regular rate and rhythm with no rubs or gallops.  No thyromegaly or JVD noted.  No lower extremity edema. 2/4 pulses in all 4 extremities. Marland Kitchen Respiratory: Clear to auscultation with no wheezes or rales. Good inspiratory effort. . Abdomen: Soft nontender nondistended with normal bowel sounds x4 quadrants. . Muskuloskeletal: Left heel superficial wound.  No cyanosis, clubbing or edema noted bilaterally . Neuro: Left-sided weakness.  CN II-XII intact, strength, sensation, reflexes . Skin: Left heel superficial wound noted . Psychiatry: Judgement and insight appear normal. Mood is appropriate for condition and setting          Labs on Admission:  Basic Metabolic Panel: Recent Labs  Lab 08/12/20 2204  NA 141  K 2.4*  CL 101  CO2 21*  GLUCOSE 168*  BUN 36*  CREATININE 2.60*  CALCIUM 9.5   Liver Function Tests: No results for input(s): AST, ALT, ALKPHOS, BILITOT, PROT, ALBUMIN in the last 168 hours. No results for input(s): LIPASE, AMYLASE in the last 168 hours. No results for input(s): AMMONIA in the last 168 hours. CBC: Recent Labs  Lab 08/12/20 2204  WBC 9.9  HGB 12.9  HCT 38.9  MCV 99.7  PLT 327   Cardiac Enzymes: No results for input(s): CKTOTAL, CKMB, CKMBINDEX, TROPONINI in the last 168 hours.  BNP (last 3 results) No results for input(s): BNP in the last 8760 hours.  ProBNP (last 3 results) No results for input(s): PROBNP in the last 8760 hours.  CBG: Recent Labs  Lab 08/12/20 2131  GLUCAP 155*    Radiological Exams on Admission: DG Chest Portable 1 View  Result Date: 08/12/2020 CLINICAL DATA:  Dyspnea EXAM: PORTABLE CHEST 1 VIEW COMPARISON:  07/24/2020 FINDINGS: The lungs are hyperinflated with diffuse interstitial prominence. No focal airspace consolidation or pulmonary edema. No pleural effusion  or pneumothorax. Normal cardiomediastinal contours. Status post TAVR. IMPRESSION: COPD without acute airspace disease. Electronically Signed   By: 07/26/2020 M.D.   On: 08/12/2020 23:18    EKG: I independently viewed the EKG done and my findings are as followed: Sinus tachycardia with occasional PVCs at rate of 105 bpm.  Prolonged QTc (08/14/2020)  Assessment/Plan Present on Admission: . Essential hypertension . Mixed hyperlipidemia . PAF (paroxysmal atrial fibrillation) (HCC) . Hypokalemia  Principal Problem:   Generalized weakness Active Problems:   Essential hypertension   S/P TAVR (transcatheter aortic valve replacement)   PAF (paroxysmal atrial fibrillation) (HCC)   Mixed hyperlipidemia   Left-sided weakness   Hypokalemia   Ischemic stroke (HCC)   Nausea & vomiting   Lactic acidosis   Hyperglycemia   Dehydration   AKI (  acute kidney injury) (HCC)   Elevated troponin   Prolonged QT interval   Diarrhea   Open wound of left heel   Generalized weakness secondary to multifactorial Patient has had poor appetite within last couple of days, she had nausea, vomiting, diarrhea She was noted to be hypothermic and hypotensive in the ED Nausea, vomiting and diarrhea have since resolved Bair Hugger provided in the ED IV hydration provided due to hypotension Protein supplement will be provided Continue fall precaution and neurochecks  Hypokalemia K+ 2.4, possibly due to diarrhea and diuretic effect Potassium will be replenished  Lactic acidosis possibly secondary to multifactorial Lactic acid 4.7, this may be due to vomiting effect, dehydration due to diarrhea and starvation ketosis considering poor appetite within last couple of days IV hydration provided in the ED Continue to trend lactic acid.  Hypotension IV hydration provided as indicated above Continue IV hydration  Hypothermia Bair Hugger provided TSH will be checked  Diarrhea, nausea, vomiting-resolved C.Diff  will be checked if patient continues to have diarrhea Antiemetic will be provided if nausea and vomiting returns-currently held due to prolonged QT interval  Dehydration Continue IV hydration for above  Acute kidney injury on CKD BUN to creatinine 36/2.60 (baseline creatinine at 1.1-1.3) This may be due to dehydration caused by diarrhea and vomiting as well diuretic effect Renally adjust medications, avoid nephrotoxic agents/dehydration/hypotension  Superficial left heel wound Continue wound care Patient was empirically started on IV vancomycin due to suspicion for infectious process considering elevated lactic acid, hypothermia, hypotension.  We shall continue with same at this time with plan to stop the antibiotic based on procalcitonin and blood culture  Prolonged QTc QTc 526ms K+ was low at 2.4, this will be replenished Avoid QT prolonging drugs Magnesium level will be checked Repeat EKG in the morning  Elevated troponin possibly secondary to type II demand ischemia Troponin 63> 61, patient denies any chest pain Continue to monitor and treat accordingly  Hyperglycemia with no known history of type 2 diabetes mellitus Blood glucose level 168, this may be a reactive process Hemoglobin A1c done on 07/24/2020 was 5.1  Essential hypertension BP meds will be held at this time due to hypotension  Hyperlipidemia Continue Crestor per home regimen  Paroxysmal atrial fibrillation Lopressor temporarily held at this time due to hypotension Amiodarone temporarily held at this time due to prolonged QT Continue Eliquis  History of ischemic stroke Continue Crestor and Eliquis Continue PT/OT eval and treat  DVT prophylaxis: Eliquis  Code Status: Full code  Family Communication: None at bedside  Disposition Plan:  Patient is from:                        home Anticipated DC to:                   home Anticipated DC date:               2-3 days Anticipated DC barriers:          Unstable to be discharged at this time due to electrolyte abnormality resulting in possible prolonged QT interval as well as being hypothermic, hypotensive that require inpatient management.  Consults called: None  Admission status: Inpatient    Frankey Shownladapo Amand Lemoine MD [facility]  If 7PM-7AM, please contact night-coverage [email or web address] Password [email or web address]  08/13/2020, 12:41 AM

## 2020-08-12 NOTE — ED Provider Notes (Signed)
[facility] EMERGENCY DEPARTMENT Provider Note   CSN: [id number] Arrival date & time: 08/12/20  2101     History Chief Complaint  Patient presents with  . Emesis    Catherine Anthony is a 86 y.o. female.  HPI   86 year old female coming from home via EMS.  Patient states that she was home and "my daughter did not like the way looked and called 911."  She states that she has had a poor appetite for the last couple days.  I diarrhea yesterday.  Nausea vomited once today.  Recent admission for cardioembolic stroke with left-sided weakness.  She reports that this is stable.  Some pain in her left leg since her last admission.  She denies any acute change or any other acute pain otherwise.  She has not felt like she has had a fever.  No acute respiratory complaints.  No sick contacts that she is aware of.  Past Medical History:  Diagnosis Date  . Arthritis    Right hand  . Complication of anesthesia   . Coronary artery disease    s/p mutliple PCIs  . Dyslipidemia   . Family hx of colon cancer   . History of kidney stones   . Hypertension   . PAF (paroxysmal atrial fibrillation) (HCC)    on Eliquis  . S/P TAVR (transcatheter aortic valve replacement)   . Severe aortic stenosis     Patient Active Problem List   Diagnosis Date Noted  . Acute ischemic stroke (HCC) 07/25/2020  . Left hemiparesis (HCC) 07/24/2020  . Acute left-sided weakness   . Hypokalemia   . Leg edema, left 12/11/2019  . Frequent unifocal PVCs 08/09/2019  . Indication present for endocarditis prophylaxis 08/09/2019  . Mixed hyperlipidemia 08/01/2019  . Acute on chronic diastolic heart failure (HCC) 06/06/2019  . S/P TAVR (transcatheter aortic valve replacement) 06/06/2019  . Essential hypertension   . High cholesterol   . Coronary artery disease   . PAF (paroxysmal atrial fibrillation) (HCC)   . Moderate aortic regurgitation   . Coronary artery disease with stable angina pectoris (HCC) 08/31/2017     Past Surgical History:  Procedure Laterality Date  . CATARACT EXTRACTION W/ INTRAOCULAR LENS IMPLANT Right 06/04/2011   [facility]  . CATARACT EXTRACTION W/PHACO  06/22/2011   Procedure: CATARACT EXTRACTION PHACO AND INTRAOCULAR LENS PLACEMENT (IOC);  Surgeon: Gemma Payor;  Location: [facility] ORS;  Service: Ophthalmology;  Laterality: Left;  CDE 14.34  . COLONOSCOPY  06/23/08  . COLONOSCOPY N/A 01/18/2014   Procedure: COLONOSCOPY;  Surgeon: Malissa Hippo, MD;  Location: [facility] ENDO SUITE;  Service: Endoscopy;  Laterality: N/A;  145  . CORONARY ANGIOPLASTY WITH STENT PLACEMENT    . CORONARY ATHERECTOMY N/A 09/21/2017   Procedure: CORONARY ATHERECTOMY;  Surgeon: Elder Negus, MD;  Location: [facility] INVASIVE CV LAB;  Service: Cardiovascular;  Laterality: N/A;  . CORONARY STENT INTERVENTION N/A 08/31/2017   Procedure: CORONARY STENT INTERVENTION;  Surgeon: Elder Negus, MD;  Location: [facility] INVASIVE CV LAB;  Service: Cardiovascular;  Laterality: N/A;  . CORONARY STENT INTERVENTION N/A 09/21/2017   Procedure: CORONARY STENT INTERVENTION;  Surgeon: Elder Negus, MD;  Location: [facility] INVASIVE CV LAB;  Service: Cardiovascular;  Laterality: N/A;  . EYE SURGERY Bilateral    "after cataract OR"  . INTRAVASCULAR ULTRASOUND/IVUS N/A 09/21/2017   Procedure: Intravascular Ultrasound/IVUS;  Surgeon: Elder Negus, MD;  Location: [facility] INVAS[facility] CV LAB;  Service: Cardiovascular;  Laterality: N/A;  . RIGHT/LEFT HEART CATH AND  CORONARY ANGIOGRAPHY N/A 08/31/2017   Procedure: RIGHT/LEFT HEART CATH AND CORONARY ANGIOGRAPHY;  Surgeon: Elder Negus, MD;  Location: [facility] INVASIVE CV LAB;  Service: Cardiovascular;  Laterality: N/A;  . RIGHT/LEFT HEART CATH AND CORONARY ANGIOGRAPHY N/A 05/16/2019   Procedure: RIGHT/LEFT HEART CATH AND CORONARY ANGIOGRAPHY;  Surgeon: Elder Negus, MD;  Location: [facility] INVAS[facility] CV LAB;  Service: Cardiovascular;  Laterality: N/A;  . TEE WITHOUT CARDIOVERSION N/A 06/06/2019    Procedure: TRANSESOPHAGEAL ECHOCARDIOGRAM (TEE);  Surgeon: Tonny Bollman, MD;  Location: [facility] INVASIVE CV LAB;  Service: Open Heart Surgery;  Laterality: N/A;  . TRANSCATHETER AORTIC VALVE REPLACEMENT, TRANSFEMORAL N/A 06/06/2019   Procedure: TRANSCATHETER AORTIC VALVE REPLACEMENT, TRANSFEMORAL;  Surgeon: Tonny Bollman, MD;  Location: [facility] INVASIVE CV LAB;  Service: Open Heart Surgery;  Laterality: N/A;  . ULTRASOUND GUIDANCE FOR VASCULAR ACCESS  09/21/2017   Procedure: Ultrasound Guidance For Vascular Access;  Surgeon: Elder Negus, MD;  Location: [facility] INVASIVE CV LAB;  Service: Cardiovascular;;  . VARICOSE VEIN SURGERY Bilateral 1960s   [facility]     OB History   No obstetric history on file.     Family History  Problem Relation Age of Onset  . Hypertension Mother     Social History   Tobacco Use  . Smoking status: Never Smoker  . Smokeless tobacco: Never Used  Vaping Use  . Vaping Use: Never used  Substance Use Topics  . Alcohol use: No  . Drug use: No    Home Medications Prior to Admission medications   Medication Sig Start Date End Date Taking? Authorizing Provider  acetaminophen (TYLENOL) 325 MG tablet Take 650 mg by mouth every 6 (six) hours as needed.    [provider]  amiodarone (PACERONE) 200 MG tablet Take 1 tablet (200 mg total) by mouth as directed. Monday and Friday 08/01/20   Medina-Vargas, Monina C, NP  apixaban (ELIQUIS) 2.5 MG TABS tablet TAKE (1) TABLET BY MOUTH TWICE DAILY. 08/01/20   Medina-Vargas, Monina C, NP  Cholecalciferol (VITAMIN D3) 50 MCG (2000 UT) TABS Take 2 tablets by mouth daily.     [provider]  furosemide (LASIX) 20 MG tablet Take 1 tablet (20 mg total) by mouth daily. 08/01/20 08/31/20  Medina-Vargas, Monina C, NP  hydrALAZINE (APRESOLINE) 25 MG tablet Take 1 tablet (25 mg total) by mouth 3 (three) times daily. 08/01/20 08/31/20  Medina-Vargas, Monina C, NP  lisinopril (ZESTRIL) 40 MG tablet Take 1 tablet (40 mg total) by  mouth daily. 08/01/20   Medina-Vargas, Monina C, NP  metoprolol tartrate (LOPRESSOR) 25 MG tablet TAKE (1) TABLET BY MOUTH TWICE DAILY. 08/01/20   Medina-Vargas, Monina C, NP  mirtazapine (REMERON) 15 MG tablet Take 0.5 tablets (7.5 mg total) by mouth at bedtime. 08/01/20   Medina-Vargas, Monina C, NP  nitroGLYCERIN (NITROSTAT) 0.4 MG SL tablet Place 1 tablet (0.4 mg total) under the tongue every 5 (five) minutes x 3 doses as needed for chest pain. 08/01/20   Medina-Vargas, Monina C, NP  Nutritional Supplements (NUTRITIONAL SUPPLEMENT PO) Take 1 each by mouth in the morning and at bedtime. Manufacturing engineer, Historical, MD  rosuvastatin (CRESTOR) 20 MG tablet TAKE (1) TABLET BY MOUTH ONCE DAILY. 08/01/20   Medina-Vargas, Monina C, NP    Allergies    Codeine  Review of Systems   Review of Systems All systems reviewed and negative, other than as noted in HPI.  Physical Exam Updated Vital Signs BP (!) 108/43   Pulse 70  Temp (!) 92.9 F (33.8 C) (Rectal)   Resp (!) 25   Ht  (1.626 m)   Wt 40.8 kg   SpO2 100%   BMI 15.45 kg/m   Physical Exam Vitals and nursing note reviewed.  Constitutional:      General: She is not in acute distress.    Appearance: She is well-developed.     Comments: frail/cachectic.  HENT:     Head: Normocephalic and atraumatic.  Eyes:     General:        Right eye: No discharge.        Left eye: No discharge.     Conjunctiva/sclera: Conjunctivae normal.  Cardiovascular:     Rate and Rhythm: Normal rate and regular rhythm.     Heart sounds: Normal heart sounds. No murmur heard.  No friction rub. No gallop.   Pulmonary:     Effort: Pulmonary effort is normal. No respiratory distress.     Breath sounds: Normal breath sounds.  Abdominal:     General: There is no distension.     Palpations: Abdomen is soft.     Tenderness: There is no abdominal tenderness.  Musculoskeletal:        General: No tenderness.     Cervical back: Neck supple.  Skin:     General: Skin is warm and dry.  Neurological:     Mental Status: She is alert.     Comments: Speech clear. no facial droop.  Left-sided weakness.  Psychiatric:        Behavior: Behavior normal.        Thought Content: Thought content normal.     ED Results / Procedures / Treatments   Labs (all labs ordered are listed, but only abnormal results are displayed) Labs Reviewed  BASIC METABOLIC PANEL - Abnormal; Notable for the following components:      Result Value   Potassium 2.4 (*)    CO2 21 (*)    Glucose, Bld 168 (*)    BUN 36 (*)    Creatinine, Ser 2.60 (*)    GFR calc non Af Amer 16 (*)    GFR calc Af Amer 18 (*)    Anion gap 19 (*)    All other components within normal limits  LACTIC ACID, PLASMA - Abnormal; Notable for the following components:   Lactic Acid, Venous 4.7 (*)    All other components within normal limits  LACTIC ACID, PLASMA - Abnormal; Notable for the following components:   Lactic Acid, Venous 3.0 (*)    All other components within normal limits  COMPREHENSIVE METABOLIC PANEL - Abnormal; Notable for the following components:   Potassium 3.2 (*)    BUN 32 (*)    Creatinine, Ser 2.15 (*)    Calcium 8.3 (*)    Total Protein 5.0 (*)    Albumin 2.5 (*)    GFR calc non Af Amer 20 (*)    GFR calc Af Amer 23 (*)    All other components within normal limits  CBC - Abnormal; Notable for the following components:   RBC 3.12 (*)    Hemoglobin 10.3 (*)    HCT 31.4 (*)    MCV 100.6 (*)    All other components within normal limits  PROTIME-INR - Abnormal; Notable for the following components:   Prothrombin Time 17.6 (*)    INR 1.5 (*)    All other components within normal limits  BASIC METABOLIC PANEL - Abnormal; Notable for the  following components:   Potassium 3.3 (*)    Chloride 114 (*)    CO2 21 (*)    Creatinine, Ser 1.55 (*)    Calcium 8.1 (*)    GFR calc non Af Amer 30 (*)    GFR calc Af Amer 34 (*)    All other components within normal limits   CBC WITH DIFFERENTIAL/PLATELET - Abnormal; Notable for the following components:   RBC 3.29 (*)    Hemoglobin 10.8 (*)    HCT 33.8 (*)    MCV 102.7 (*)    All other components within normal limits  CBG MONITORING, ED - Abnormal; Notable for the following components:   Glucose-Capillary 155 (*)    All other components within normal limits  TROPONIN I (HIGH SENSITIVITY) - Abnormal; Notable for the following components:   Troponin I (High Sensitivity) 63 (*)    All other components within normal limits  TROPONIN I (HIGH SENSITIVITY) - Abnormal; Notable for the following components:   Troponin I (High Sensitivity) 61 (*)    All other components within normal limits  SARS CORONAVIRUS 2 BY RT PCR (HOSPITAL ORDER, PERFORMED IN [facility])  CULTURE, BLOOD (ROUTINE X 2)  CULTURE, BLOOD (ROUTINE X 2)  CBC  MAGNESIUM  TSH  PROCALCITONIN  APTT  PHOSPHORUS  LACTIC ACID, PLASMA  URINALYSIS, ROUTINE W REFLEX MICROSCOPIC    EKG EKG Interpretation  Date/Time:  Monday August 12 2020 21:18:30 EDT Ventricular Rate:  105 PR Interval:  160 QRS Duration: 82 QT Interval:  398 QTC Calculation: 526 R Axis:   -13 Text Interpretation: Sinus tachycardia with occasional Premature ventricular complexes Prolonged QT Abnormal ECG Interpretation limited secondary to artifact Confirmed by Raeford Razor [phone]) on 08/12/2020 9:37:25 PM   Radiology DG Chest Portable 1 View  Result Date: 08/12/2020 CLINICAL DATA:  Dyspnea EXAM: PORTABLE CHEST 1 VIEW COMPARISON:  07/24/2020 FINDINGS: The lungs are hyperinflated with diffuse interstitial prominence. No focal airspace consolidation or pulmonary edema. No pleural effusion or pneumothorax. Normal cardiomediastinal contours. Status post TAVR. IMPRESSION: COPD without acute airspace disease. Electronically Signed   By: Deatra Robinson M.D.   On: 08/12/2020 23:18    Procedures Procedures (including critical care time)  Medications Ordered in  ED Medications - No data to display  ED Course  I have reviewed the triage vital signs and the nursing notes.  Pertinent labs & imaging results that were available during my care of the patient were reviewed by me and considered in my medical decision making (see chart for details).    MDM Rules/Calculators/A&P                          86 year old female with generalized weakness, anorexia diarrhea yesterday which has since resolved 1 episode of vomiting today.  Not sure if just dry from poor PO intake and GI loss. IVF. Supplement potassium. Also hypothermic and Lactic acid >4. Will empirically cover for possible infectious process although I don't have a clear source at this point if so. COVID still pending.     Final Clinical Impression(s) / ED Diagnoses Final diagnoses:  Dehydration  Hypokalemia    Rx / DC Orders ED Discharge Orders    None       Raeford Razor, MD 08/14/20 1006

## 2020-08-12 NOTE — ED Notes (Signed)
Placed bear hugger on pt.

## 2020-08-13 ENCOUNTER — Inpatient Hospital Stay (HOSPITAL_COMMUNITY): Payer: Medicare Other

## 2020-08-13 DIAGNOSIS — R9431 Abnormal electrocardiogram [ECG] [EKG]: Secondary | ICD-10-CM

## 2020-08-13 DIAGNOSIS — S91302A Unspecified open wound, left foot, initial encounter: Secondary | ICD-10-CM

## 2020-08-13 DIAGNOSIS — R778 Other specified abnormalities of plasma proteins: Secondary | ICD-10-CM

## 2020-08-13 DIAGNOSIS — R197 Diarrhea, unspecified: Secondary | ICD-10-CM

## 2020-08-13 DIAGNOSIS — I959 Hypotension, unspecified: Secondary | ICD-10-CM

## 2020-08-13 DIAGNOSIS — T68XXXA Hypothermia, initial encounter: Secondary | ICD-10-CM

## 2020-08-13 DIAGNOSIS — R739 Hyperglycemia, unspecified: Secondary | ICD-10-CM

## 2020-08-13 DIAGNOSIS — E872 Acidosis, unspecified: Secondary | ICD-10-CM

## 2020-08-13 DIAGNOSIS — E860 Dehydration: Secondary | ICD-10-CM

## 2020-08-13 DIAGNOSIS — R112 Nausea with vomiting, unspecified: Secondary | ICD-10-CM

## 2020-08-13 DIAGNOSIS — N179 Acute kidney failure, unspecified: Secondary | ICD-10-CM

## 2020-08-13 LAB — CBC
HCT: 31.4 % — ABNORMAL LOW (ref 36.0–46.0)
Hemoglobin: 10.3 g/dL — ABNORMAL LOW (ref 12.0–15.0)
MCH: 33 pg (ref 26.0–34.0)
MCHC: 32.8 g/dL (ref 30.0–36.0)
MCV: 100.6 fL — ABNORMAL HIGH (ref 80.0–100.0)
Platelets: 224 10*3/uL (ref 150–400)
RBC: 3.12 MIL/uL — ABNORMAL LOW (ref 3.87–5.11)
RDW: 13.2 % (ref 11.5–15.5)
WBC: 7.8 10*3/uL (ref 4.0–10.5)
nRBC: 0 % (ref 0.0–0.2)

## 2020-08-13 LAB — TROPONIN I (HIGH SENSITIVITY): Troponin I (High Sensitivity): 61 ng/L — ABNORMAL HIGH (ref <18)

## 2020-08-13 LAB — LACTIC ACID, PLASMA: Lactic Acid, Venous: 3 mmol/L (ref 0.5–1.9)

## 2020-08-13 LAB — COMPREHENSIVE METABOLIC PANEL
ALT: 16 U/L (ref 0–44)
AST: 24 U/L (ref 15–41)
Albumin: 2.5 g/dL — ABNORMAL LOW (ref 3.5–5.0)
Alkaline Phosphatase: 48 U/L (ref 38–126)
Anion gap: 8 (ref 5–15)
BUN: 32 mg/dL — ABNORMAL HIGH (ref 8–23)
CO2: 24 mmol/L (ref 22–32)
Calcium: 8.3 mg/dL — ABNORMAL LOW (ref 8.9–10.3)
Chloride: 107 mmol/L (ref 98–111)
Creatinine, Ser: 2.15 mg/dL — ABNORMAL HIGH (ref 0.44–1.00)
GFR calc Af Amer: 23 mL/min — ABNORMAL LOW (ref >60)
GFR calc non Af Amer: 20 mL/min — ABNORMAL LOW (ref >60)
Glucose, Bld: 81 mg/dL (ref 70–99)
Potassium: 3.2 mmol/L — ABNORMAL LOW (ref 3.5–5.1)
Sodium: 139 mmol/L (ref 135–145)
Total Bilirubin: 0.6 mg/dL (ref 0.3–1.2)
Total Protein: 5 g/dL — ABNORMAL LOW (ref 6.5–8.1)

## 2020-08-13 LAB — TSH: TSH: 2.813 u[IU]/mL (ref 0.350–4.500)

## 2020-08-13 LAB — PROCALCITONIN: Procalcitonin: <0.1 ng/mL

## 2020-08-13 LAB — PROTIME-INR
INR: 1.5 — ABNORMAL HIGH (ref 0.8–1.2)
Prothrombin Time: 17.6 seconds — ABNORMAL HIGH (ref 11.4–15.2)

## 2020-08-13 LAB — PHOSPHORUS: Phosphorus: 4.2 mg/dL (ref 2.5–4.6)

## 2020-08-13 LAB — MAGNESIUM: Magnesium: 2.2 mg/dL (ref 1.7–2.4)

## 2020-08-13 LAB — APTT: aPTT: 33 seconds (ref 24–36)

## 2020-08-13 LAB — SARS CORONAVIRUS 2 BY RT PCR (HOSPITAL ORDER, PERFORMED IN ~~LOC~~ HOSPITAL LAB): SARS Coronavirus 2: NEGATIVE

## 2020-08-13 MED ORDER — ENSURE ENLIVE PO LIQD
237.0000 mL | Freq: Two times a day (BID) | ORAL | Status: DC
  Administered 2020-08-14: 237 mL via ORAL
  Filled 2020-08-13 (×4): qty 237

## 2020-08-13 MED ORDER — SODIUM CHLORIDE 0.9 % IV SOLN: INTRAVENOUS | Status: DC

## 2020-08-13 MED ORDER — SODIUM CHLORIDE 0.9 % IV SOLN
2.0000 g | INTRAVENOUS | Status: DC
  Administered 2020-08-13 – 2020-08-15 (×3): 2 g via INTRAVENOUS
  Filled 2020-08-13 (×3): qty 20

## 2020-08-13 MED ORDER — APIXABAN 2.5 MG PO TABS
2.5000 mg | ORAL_TABLET | Freq: Two times a day (BID) | ORAL | Status: DC
  Administered 2020-08-13 – 2020-08-15 (×5): 2.5 mg via ORAL
  Filled 2020-08-13 (×5): qty 1

## 2020-08-13 MED ORDER — OXYCODONE-ACETAMINOPHEN 5-325 MG PO TABS
1.0000 | ORAL_TABLET | Freq: Three times a day (TID) | ORAL | Status: DC | PRN
  Administered 2020-08-13: 1 via ORAL
  Filled 2020-08-13: qty 1

## 2020-08-13 MED ORDER — VITAMIN D 25 MCG (1000 UNIT) PO TABS
2000.0000 [IU] | ORAL_TABLET | Freq: Every day | ORAL | Status: DC
  Administered 2020-08-13 – 2020-08-15 (×3): 2000 [IU] via ORAL
  Filled 2020-08-13 (×3): qty 2

## 2020-08-13 MED ORDER — POTASSIUM CHLORIDE CRYS ER 20 MEQ PO TBCR
40.0000 meq | EXTENDED_RELEASE_TABLET | Freq: Once | ORAL | Status: AC
  Administered 2020-08-13: 40 meq via ORAL
  Filled 2020-08-13: qty 2

## 2020-08-13 MED ORDER — SODIUM CHLORIDE 0.9 % IV SOLN: Freq: Once | INTRAVENOUS | Status: AC

## 2020-08-13 MED ORDER — ROSUVASTATIN CALCIUM 20 MG PO TABS
20.0000 mg | ORAL_TABLET | Freq: Every day | ORAL | Status: DC
  Administered 2020-08-13 – 2020-08-15 (×3): 20 mg via ORAL
  Filled 2020-08-13 (×4): qty 1

## 2020-08-13 NOTE — ED Notes (Signed)
Bear hugger removed. Patient stated she was getting hot. Patient's body temperature is 98.7 oral.

## 2020-08-13 NOTE — ED Notes (Signed)
Report given to Crystal on 300.

## 2020-08-13 NOTE — Evaluation (Signed)
Physical Therapy Evaluation Patient Details Name: Catherine Anthony MRN: [id number] DOB: 08-Feb-1930 Today's Date: 08/13/2020   History of Present Illness  Catherine Anthony is a 86 y.o. female with medical history significant for hypertension, paroxysmal atrial fibrillation, s/p TAVR July 2020, hyperlipidemia, diastolic CHF and recent CVA with left-sided weakness who presents to the emergency department via EMS due to several days of generalized weakness.  Patient states that she has had decreased appetite for the last couple of days, she endorsed several episodes of diarrhea yesterday (but this has since resolved) and she admitted to nausea and nonbloody vomiting x1 today.  Patient has several days of left heel superficial wound which she has been taking care of at home.  She was recently discharged from this facility in August of this year due to acute ischemic/embolic stroke.  Patient states that daughter thought that she looked sick, so she activated 911 for her  to be taken to the ED for further evaluation and management.  She denies chest pain, shortness of breath, fever, headache or blurry vision.  Patient states that she has not had any of the Covid vaccines.    Clinical Impression  Patient functioning near baseline for functional mobility and non-ambulatory since CVA.  Patient presents with pressure sore to left heel, demonstrates slow labored movement for sitting up at bedside, frequently falls over to the left, after a few minutes able to keep trunk in mid-line while seated at EOB, poor tolerance/balance for standing due to left sided weakness and fatigue.  Patient put back to bed after therapy.  Patient will benefit from continued physical therapy in hospital and recommended venue below to increase strength, balance, endurance for safe ADLs and gait.     Follow Up Recommendations Home health PT;Supervision - Intermittent;Supervision for mobility/OOB    Equipment Recommendations  Other  (comment) Patient will benefit from installation of ramp for entrance at her home   Recommendations for Other Services       Precautions / Restrictions Precautions Precautions: Fall Restrictions Weight Bearing Restrictions: No      Mobility  Bed Mobility Overal bed mobility: Needs Assistance Bed Mobility: Supine to Sit;Sit to Supine     Supine to sit: Mod assist Sit to supine: Max assist   General bed mobility comments: unable to use LUE due to flaccid, slow labored movement  Transfers Overall transfer level: Needs assistance Equipment used: 1 person hand held assist Transfers: Sit to/from Stand Sit to Stand: Mod assist            Ambulation/Gait                Stairs            Wheelchair Mobility    Modified Rankin (Stroke Patients Only)       Balance Overall balance assessment: Needs assistance Sitting-balance support: Bilateral upper extremity supported;Feet supported Sitting balance-Leahy Scale: Poor Sitting balance - Comments: fair/poor seated at EOB   Standing balance support: No upper extremity supported;During functional activity Standing balance-Leahy Scale: Poor Standing balance comment: standing with hand held assistance                             Pertinent Vitals/Pain Pain Assessment: Faces Faces Pain Scale: Hurts little more Pain Location: left heel Pain Descriptors / Indicators: Sore;Discomfort Pain Intervention(s): Limited activity within patient's tolerance;Monitored during session;Repositioned;Other (comment) (floated left heel with pillow)    Home Living Family/patient expects to  be discharged to:: Private residence   Available Help at Discharge: Family;Available PRN/intermittently Type of Home: House Home Access: Stairs to enter Entrance Stairs-Rails: Right Entrance Stairs-Number of Steps: 5 Home Layout: Two level;Bed/bath upstairs Home Equipment: Cane - single point;Shower seat;Toilet  riser;Wheelchair - manual      Prior Function Level of Independence: Needs assistance   Gait / Transfers Assistance Needed: Non-ambulatory, Mod assisted for transfers  ADL's / Homemaking Assistance Needed: assisted by family        Hand Dominance        Extremity/Trunk Assessment   Upper Extremity Assessment Upper Extremity Assessment: Defer to OT evaluation    Lower Extremity Assessment Lower Extremity Assessment: Generalized weakness;RLE deficits/detail;LLE deficits/detail RLE Deficits / Details: grossly -4/5 RLE Sensation: WNL RLE Coordination: WNL LLE Deficits / Details: grossly 2+/5 except ankle dorsiflexion 0/5 LLE Sensation: decreased light touch LLE Coordination: decreased fine motor;decreased gross motor    Cervical / Trunk Assessment Cervical / Trunk Assessment: Normal  Communication   Communication: No difficulties  Cognition Arousal/Alertness: Awake/alert Behavior During Therapy: WFL for tasks assessed/performed Overall Cognitive Status: Within Functional Limits for tasks assessed                                        General Comments      Exercises     Assessment/Plan    PT Assessment Patient needs continued PT services  PT Problem List Decreased strength;Decreased mobility;Decreased range of motion;Decreased coordination;Decreased balance;Decreased knowledge of use of DME;Impaired sensation       PT Treatment Interventions DME instruction;Functional mobility training;Therapeutic activities;Therapeutic exercise;Balance training;Neuromuscular re-education;Patient/family education;Wheelchair mobility training    PT Goals (Current goals can be found in the Care Plan section)  Acute Rehab PT Goals Patient Stated Goal: return home with family to assist PT Goal Formulation: With patient Time For Goal Achievement: 08/20/20 Potential to Achieve Goals: Fair    Frequency Min 3X/week   Barriers to discharge        Co-evaluation                AM-PAC PT "6 Clicks" Mobility  Outcome Measure Help needed turning from your back to your side while in a flat bed without using bedrails?: A Lot Help needed moving from lying on your back to sitting on the side of a flat bed without using bedrails?: A Lot Help needed moving to and from a bed to a chair (including a wheelchair)?: A Lot Help needed standing up from a chair using your arms (e.g., wheelchair or bedside chair)?: A Lot Help needed to walk in hospital room?: Total Help needed climbing 3-5 steps with a railing? : Total 6 Click Score: 10    End of Session   Activity Tolerance: Patient tolerated treatment well;Patient limited by fatigue Patient left: in bed;with call bell/phone within reach Nurse Communication: Mobility status PT Visit Diagnosis: Unsteadiness on feet (R26.81);Other abnormalities of gait and mobility (R26.89);Muscle weakness (generalized) (M62.81) Hemiplegia - Right/Left: Left Hemiplegia - dominant/non-dominant: Non-dominant Hemiplegia - caused by: Cerebral infarction    Time: 1012-1033 PT Time Calculation (min) (ACUTE ONLY): 21 min   Charges:   PT Evaluation $PT Eval Moderate Complexity: 1 Mod PT Treatments $Therapeutic Activity: 23-37 mins        11:19 AM, 08/13/20 Ocie Bob, MPT Physical Therapist with [facility] [phone] office [phone] mobile phone

## 2020-08-13 NOTE — Progress Notes (Signed)
PROGRESS NOTE  Catherine Anthony  DOB: 08/08/1930  PCP: [name], MD NAT:[id number]  DOA: 08/12/2020  LOS: 1 day   Chief Complaint  Patient presents with  . Emesis   Brief narrative: Catherine Anthony is a 86 y.o. 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 Patient presented to the ED on 9/13 with several day history of poor oral intake, nausea, vomiting, diarrhea, generalized weakness.  She was recently hospitalized in August for ischemic stroke and was discharged to home where she was progressively declining and hence brought to the ED.  In the ED, patient was hypothermic at 92.66F, tachypneic at 20s blood pressure was soft. Labs showed WBC count normal, potassium low at 2.4, creatinine elevated 2.6, lactic acid elevated to 4.7. Covid PCR negative Chest x-ray showed COPD without acute airspace disease Patient was admitted to hospital service for further evaluation management  Subjective: Patient was seen and examined this morning. Elderly Caucasian female.  Lying on bed.  Not in distress.   Chart reviewed Hypothymia improved overnight. Heart rate in 60s mostly, blood pressure low but stable in 90s and 100s. Maintaining oxygen saturation on room air.  Assessment/Plan: Severe sepsis with hypotension -Source: Gastroenteritis versus left leg wound -Presented with hypothermia, tachypnea, hypotension, lactic acidosis, AKI. -Sepsis protocol initiated in the ED. Blood culture sent, IV hydration given. Given a dose of IV vancomycin.  -Lactic acid level improving but not down to normal. Maintenance fluid with normal saline at 75 mL/h -Start IV Rocephin -Continue to monitor temperature, lactic acid and blood pressure trend -Continue to monitor diarrhea. Recent Labs  Lab 08/12/20 2204 08/12/20 2347 08/13/20 0341 08/13/20 0451  WBC 9.9  --   --  7.8   LATICACIDVEN 4.7* 3.0*  --   --   PROCALCITON  --   --  <0.10  --    Acute gastroenteritis -Presented with nausea, vomiting, diarrhea.  -Start presumptively on IV Rocephin. Stool studies.  -Continue to monitor symptoms. -If diarrhea persists, may need imaging if symptoms persist.  Superficial left heel wound -left foot x-ray did not show any evidence of acute bony involvement.   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 Improving with IV fluid. Continue to monitor. Recent Labs    07/24/20 1205 07/24/20 1210 07/25/20 0623 08/12/20 2204 08/13/20 0451  BUN 18 17 16  36* 32*  CREATININE 1.26* 1.30* 1.13* 2.60* 2.15*   Hypokalemia -Secondary to diarrhea. Level improving with replacement. More replacement this morning. Recent Labs  Lab 08/12/20 2204 08/12/20 2347 08/13/20 0451 08/13/20 0600  K 2.4*  --  3.2*  --   MG  --  2.2  --   --   PHOS  --   --   --  4.2   Cardiovascular issues: HTN, HLD, Afib, history of ischemic stroke -Home meds include amiodarone 200 mg daily, metoprolol 25 mg twice daily, Lasix 20 mg daily, hydralazine 25 mg 3 times daily, lisinopril 40 mg daily, Eliquis 2.5 mg twice daily and Crestor 20 mg daily. -Blood pressure medicines on hold because of hypotension at presentation. -Heart rate is currently controlled. -I would resume metoprolol at a lower dose, keep admitted on hold because of QTC prolongation  -Keep Lasix, hydralazine and lisinopril on hold because of low blood pressure.  -Continue Eliquis and statin.   Prolonged  QTc -Initial EKG with QTc -Likely secondary to hypokalemia. Repeat EKG today  Elevated troponin  -Troponin 63> 61, patient denies any chest pain -Likely demand ischemia due to sepsis.   Hyperglycemia -Known history of diabetes mellitus -A1c 5.1 on 8/25 Generalized weakness -Multifactorial: Severe sepsis,  poor oral intake, diarrhea, low electrolytes, physical deconditioning, recent stroke -Issues addressed individually as below. -Pending PT eval.  Mobility: Encourage PT eval Code Status:   Code Status: Full Code  Nutritional status: Body mass index is 15.45 kg/m.     Diet Order            Diet Heart Room service appropriate? Yes; Fluid consistency: Thin  Diet effective now                 DVT prophylaxis: apixaban (ELIQUIS) tablet 2.5 mg Start: 08/13/20 1000 SCDs Start: 08/13/20 0355 apixaban (ELIQUIS) tablet 2.5 mg   Antimicrobials:  IV Rocephin Fluid: None Consultants: Currently on normal saline at 75 mill per hour Family Communication:  None at bedside  Status is: Inpatient  Remains inpatient appropriate because:Ongoing active pain requiring inpatient pain management, Ongoing diagnostic testing needed not appropriate for outpatient work up and IV treatments appropriate due to intensity of illness or inability to take PO   Dispo: The patient is from: Home              Anticipated d/c is to: Home versus SNF based on PT eval              Anticipated d/c date is: 2 days              Patient currently is not medically stable to d/c.       Infusions:  . sodium chloride 75 mL/hr at 08/13/20 0840  . cefTRIAXone (ROCEPHIN)  IV 2 g (08/13/20 0911)    Scheduled Meds: . apixaban  2.5 mg Oral BID  . cholecalciferol  2,000 Units Oral Daily  . feeding supplement (ENSURE ENLIVE)  237 mL Oral BID BM  . rosuvastatin  20 mg Oral Daily    Antimicrobials: Anti-infectives (From admission, onward)   Start     Dose/Rate Route Frequency Ordered Stop   08/13/20 0845  cefTRIAXone (ROCEPHIN) 2 g in sodium chloride 0.9 % 100 mL IVPB        2 g 200 mL/hr over 30 Minutes Intravenous Every 24 hours 08/13/20 0831     08/12/20 2345  vancomycin (VANCOCIN) IVPB 1000 mg/200 mL premix        1,000 mg 200 mL/hr over 60 Minutes Intravenous  Once 08/12/20 2330 08/13/20 0107      PRN  meds:    Objective: Vitals:   08/13/20 0900 08/13/20 1000  BP: (!) 124/56 (!) 114/44  Pulse: 72 61  Resp: 20 16  Temp:    SpO2: 99% 99%    Intake/Output Summary (Last 24 hours) at 08/13/2020 1348 Last data filed at 08/13/2020 0514 Gross per 24 hour  Intake 1660.84 ml  Output --  Net 1660.84 ml   Filed Weights   08/12/20 2115  Weight: 40.8 kg   Weight change:  Body mass index is 15.45 kg/m.   Physical Exam: General exam: Appears calm and comfortable.  Not in physical distress Skin: No rashes, lesions or ulcers. HEENT: Atraumatic, normocephalic, supple neck, no obvious bleeding Lungs: Clear to auscultation bilaterally CVS: Regular rate and rhythm, no murmur GI/Abd soft, nontender, nondistended, bowel sound present CNS: Awake, alert, oriented to place person and time.  However slow to respond Psychiatry: Mood appropriate Extremities: No pedal edema, no calf tenderness, left heel pressure wound small size.  Data Review: I have personally reviewed the laboratory data and studies available.  Recent Labs  Lab 08/12/20 2204 08/13/20 0451  WBC 9.9 7.8  HGB 12.9 10.3*  HCT 38.9 31.4*  MCV 99.7 100.6*  PLT 327 224   Recent Labs  Lab 08/12/20 2204 08/12/20 2347 08/13/20 0451 08/13/20 0600  NA 141  --  139  --   K 2.4*  --  3.2*  --   CL 101  --  107  --   CO2 21*  --  24  --   GLUCOSE 168*  --  81  --   BUN 36*  --  32*  --   CREATININE 2.60*  --  2.15*  --   CALCIUM 9.5  --  8.3*  --   MG  --  2.2  --   --   PHOS  --   --   --  4.2    F/u labs ordered  Signed, Lorin Glass, MD [facility] 08/13/2020

## 2020-08-13 NOTE — Plan of Care (Signed)
  Problem: Acute Rehab PT Goals(only PT should resolve) Goal: Pt Will Go Supine/Side To Sit Outcome: Progressing Flowsheets (Taken 08/13/2020 1120) Pt will go Supine/Side to Sit:  with minimal assist  with moderate assist Goal: Pt Will Go Sit To Supine/Side Outcome: Progressing Flowsheets (Taken 08/13/2020 1120) Pt will go Sit to Supine/Side:  with minimal assist  with moderate assist Goal: Patient Will Perform Sitting Balance Outcome: Progressing Flowsheets (Taken 08/13/2020 1120) Patient will perform sitting balance:  with supervision  with modified independence Goal: Patient Will Transfer Sit To/From Stand Outcome: Progressing Flowsheets (Taken 08/13/2020 1120) Patient will transfer sit to/from stand:  with minimal assist  with moderate assist Goal: Pt Will Transfer Bed To Chair/Chair To Bed Outcome: Progressing Flowsheets (Taken 08/13/2020 1120) Pt will Transfer Bed to Chair/Chair to Bed:  with mod assist  with min assist   11:21 AM, 08/13/20 Ocie Bob, MPT Physical Therapist with [facility] [phone] office [phone] mobile phone

## 2020-08-13 NOTE — Progress Notes (Signed)
Pt arrived to room 324 via stretcher from ED at 1800. Oriented to room and safety procedures. IV intact with no s/s infiltration. Tele applied. Purewick intact. Pt states has not voided since to ED, states feels like has to go but unable. Abd slightly distended, no discomfort when palpated. Unable to use bladder scanner at this time due to low battery.  Pt's daughter at bedside, requests to feed patient food she brought in from outside. Pt states will eat.

## 2020-08-14 LAB — CBC WITH DIFFERENTIAL/PLATELET
Abs Immature Granulocytes: 0.04 10*3/uL (ref 0.00–0.07)
Basophils Absolute: 0 10*3/uL (ref 0.0–0.1)
Basophils Relative: 0 %
Eosinophils Absolute: 0.1 10*3/uL (ref 0.0–0.5)
Eosinophils Relative: 1 %
HCT: 33.8 % — ABNORMAL LOW (ref 36.0–46.0)
Hemoglobin: 10.8 g/dL — ABNORMAL LOW (ref 12.0–15.0)
Immature Granulocytes: 1 %
Lymphocytes Relative: 14 %
Lymphs Abs: 1.1 10*3/uL (ref 0.7–4.0)
MCH: 32.8 pg (ref 26.0–34.0)
MCHC: 32 g/dL (ref 30.0–36.0)
MCV: 102.7 fL — ABNORMAL HIGH (ref 80.0–100.0)
Monocytes Absolute: 0.5 10*3/uL (ref 0.1–1.0)
Monocytes Relative: 6 %
Neutro Abs: 6.6 10*3/uL (ref 1.7–7.7)
Neutrophils Relative %: 78 %
Platelets: 204 10*3/uL (ref 150–400)
RBC: 3.29 MIL/uL — ABNORMAL LOW (ref 3.87–5.11)
RDW: 13.2 % (ref 11.5–15.5)
WBC: 8.4 10*3/uL (ref 4.0–10.5)
nRBC: 0 % (ref 0.0–0.2)

## 2020-08-14 LAB — BASIC METABOLIC PANEL
Anion gap: 7 (ref 5–15)
BUN: 23 mg/dL (ref 8–23)
CO2: 21 mmol/L — ABNORMAL LOW (ref 22–32)
Calcium: 8.1 mg/dL — ABNORMAL LOW (ref 8.9–10.3)
Chloride: 114 mmol/L — ABNORMAL HIGH (ref 98–111)
Creatinine, Ser: 1.55 mg/dL — ABNORMAL HIGH (ref 0.44–1.00)
GFR calc Af Amer: 34 mL/min — ABNORMAL LOW (ref >60)
GFR calc non Af Amer: 30 mL/min — ABNORMAL LOW (ref >60)
Glucose, Bld: 72 mg/dL (ref 70–99)
Potassium: 3.3 mmol/L — ABNORMAL LOW (ref 3.5–5.1)
Sodium: 142 mmol/L (ref 135–145)

## 2020-08-14 LAB — LACTIC ACID, PLASMA: Lactic Acid, Venous: 0.8 mmol/L (ref 0.5–1.9)

## 2020-08-14 MED ORDER — POTASSIUM CHLORIDE CRYS ER 20 MEQ PO TBCR
40.0000 meq | EXTENDED_RELEASE_TABLET | Freq: Once | ORAL | Status: AC
  Administered 2020-08-14: 40 meq via ORAL
  Filled 2020-08-14: qty 2

## 2020-08-14 MED ORDER — MEGESTROL ACETATE 40 MG PO TABS
40.0000 mg | ORAL_TABLET | Freq: Every day | ORAL | Status: DC
  Filled 2020-08-14 (×3): qty 1

## 2020-08-14 MED ORDER — OCUVITE-LUTEIN PO CAPS
1.0000 | ORAL_CAPSULE | Freq: Every day | ORAL | Status: DC
  Administered 2020-08-14 – 2020-08-15 (×2): 1 via ORAL
  Filled 2020-08-14 (×2): qty 1

## 2020-08-14 MED ORDER — MELATONIN 3 MG PO TABS
6.0000 mg | ORAL_TABLET | Freq: Every evening | ORAL | Status: DC | PRN
  Administered 2020-08-14: 6 mg via ORAL
  Filled 2020-08-14: qty 2

## 2020-08-14 MED ORDER — MIRTAZAPINE 15 MG PO TABS
7.5000 mg | ORAL_TABLET | Freq: Every day | ORAL | Status: DC
  Administered 2020-08-14: 7.5 mg via ORAL
  Filled 2020-08-14: qty 1

## 2020-08-14 MED ORDER — AMIODARONE HCL 200 MG PO TABS: 200.0000 mg | ORAL_TABLET | ORAL | Status: DC

## 2020-08-14 MED ORDER — METOPROLOL TARTRATE 25 MG PO TABS
12.5000 mg | ORAL_TABLET | Freq: Two times a day (BID) | ORAL | Status: DC
  Administered 2020-08-14 – 2020-08-15 (×3): 12.5 mg via ORAL
  Filled 2020-08-14 (×3): qty 1

## 2020-08-14 MED ORDER — SENNOSIDES-DOCUSATE SODIUM 8.6-50 MG PO TABS
1.0000 | ORAL_TABLET | Freq: Every day | ORAL | Status: DC
  Administered 2020-08-14: 1 via ORAL
  Filled 2020-08-14: qty 1

## 2020-08-14 NOTE — Progress Notes (Addendum)
PROGRESS NOTE  Catherine Anthony  DOB: 09-24-1930  PCP: [name], MD PFX:[id number]  DOA: 08/12/2020  LOS: 2 days   Chief Complaint  Patient presents with  . Emesis   Brief narrative: Catherine Anthony is a 86 y.o. 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 Patient presented to the ED on 9/13 with several day history of poor oral intake, nausea, vomiting, diarrhea, generalized weakness.  She was recently hospitalized in August for ischemic stroke and was discharged to home where she was progressively declining and hence brought to the ED.  In the ED, patient was hypothermic at 92.38F, tachypneic at 20s blood pressure was soft. Labs showed WBC count normal, potassium low at 2.4, creatinine elevated 2.6, lactic acid elevated to 4.7. Covid PCR negative Chest x-ray showed COPD without acute airspace disease Patient was admitted to hospital service for further evaluation management  Subjective: Patient was seen and examined this morning. Propped up in bed.  Not in distress. No recurrence of diarrhea. Hemodynamically stable. Labs from this morning showed potassium low at 3.3, creatinine improving to 1.55, lactic acidosis, improving.  Assessment/Plan: Severe sepsis with hypotension -Source: Gastroenteritis versus left leg wound -Presented with hypothermia, tachypnea, hypotension, lactic acidosis, AKI. -Admitted for sepsis.  With IV antibiotics, IV fluids, sepsis parameters are improving.   -Blood culture pending report.   -Currently on IV Rocephin.  -Hemodynamically stable.  Reduce IV fluid rate to 50 mill per hour.  Recent Labs  Lab 08/12/20 2204 08/12/20 2347 08/13/20 0341 08/13/20 0451 08/14/20 0703  WBC 9.9  --   --  7.8 8.4  LATICACIDVEN 4.7* 3.0*  --   --  0.8  PROCALCITON  --   --  <0.10  --   --    Acute gastroenteritis -Presented  with nausea, vomiting, diarrhea.  -Evidently no recurrence of symptoms since admission.  Superficial left heel wound -left foot x-ray did not show any evidence of acute bony involvement.   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 -Improving creatinine with IV fluid. Continue to monitor.  Continue IV fluid Recent Labs    07/24/20 1205 07/24/20 1210 07/25/20 0623 08/12/20 2204 08/13/20 0451 08/14/20 0703  BUN 18 17 16  36* 32* 23  CREATININE 1.26* 1.30* 1.13* 2.60* 2.15* 1.55*   Hypokalemia -Secondary to diarrhea. Level improving with replacement. More replacement given this morning. Recent Labs  Lab 08/12/20 2204 08/12/20 2347 08/13/20 0451 08/13/20 0600 08/14/20 0703  K 2.4*  --  3.2*  --  3.3*  MG  --  2.2  --   --   --   PHOS  --   --   --  4.2  --    HTN -Home meds include metoprolol 25 mg twice daily, Lasix 20 mg daily, hydralazine 25 mg 3 times daily, lisinopril 40 mg daily -Blood pressure medicines on hold because of hypotension at presentation. -Currently blood pressure is controlled with metoprolol 12.5 mg twice daily.  Continue to follow others. -Continue to monitor blood pressure.  Afib -Continue metoprolol at a lower dose. Continue Eliquis. -Patient is also on amiodarone 200 mg on Monday and Friday.  history of ischemic stroke HLD -Continue Eliquis and statin  Prolonged QTc -Initial EKG with QTc Thursday -Likely secondary to hypokalemia. Repeat EKG today  Elevated troponin  -Troponin  63> 61, patient denies any chest pain -Likely demand ischemia due to sepsis.   Hyperglycemia -Known history of diabetes mellitus -A1c 5.1 on 8/25 Generalized weakness -Multifactorial: Severe sepsis, poor oral intake, diarrhea, low electrolytes, physical deconditioning, recent stroke -Issues addressed individually -PT eval recommended home health  PT  Severe malnutrition -in context of acute illness/injury -Nutrition consult appreciated.  Nutritional supplement started.  Mobility: Followed by PT Code Status:   Code Status: Full Code  Nutritional status: Body mass index is 16.35 kg/m.     Diet Order            Diet Heart Room service appropriate? Yes; Fluid consistency: Thin  Diet effective now                 DVT prophylaxis: apixaban (ELIQUIS) tablet 2.5 mg Start: 08/13/20 1000 SCDs Start: 08/13/20 0355 apixaban (ELIQUIS) tablet 2.5 mg   Antimicrobials:  IV Rocephin Fluid: None Consultants: Currently on normal saline at 50 mill per hour Family Communication:  d/w patient's daughter Ms. [name] this am.  Status is: Inpatient  Remains inpatient appropriate because: Pending blood culture report, on IV antibiotics.  Just starting to be hemodynamically stable   Dispo: The patient is from: Home              Anticipated d/c is to: Home versus SNF based on PT eval              Anticipated d/c date is: 2 days              Patient currently is not medically stable to d/c.  Infusions:  . sodium chloride 75 mL/hr at 08/14/20 0651  . cefTRIAXone (ROCEPHIN)  IV Stopped (08/13/20 0941)    Scheduled Meds: . [START ON 08/16/2020] amiodarone  200 mg Oral Once per day on Mon Fri  . apixaban  2.5 mg Oral BID  . cholecalciferol  2,000 Units Oral Daily  . feeding supplement (ENSURE ENLIVE)  237 mL Oral BID BM  . metoprolol tartrate  12.5 mg Oral BID  . mirtazapine  7.5 mg Oral QHS  . potassium chloride  40 mEq Oral Once  . rosuvastatin  20 mg Oral Daily  . senna-docusate  1 tablet Oral QHS    Antimicrobials: Anti-infectives (From admission, onward)   Start     Dose/Rate Route Frequency Ordered Stop   08/13/20 0845  cefTRIAXone (ROCEPHIN) 2 g in sodium chloride 0.9 % 100 mL IVPB        2 g 200 mL/hr over 30 Minutes Intravenous Every 24 hours 08/13/20 0831     08/12/20 2345  vancomycin (VANCOCIN) IVPB 1000 mg/200 mL premix         1,000 mg 200 mL/hr over 60 Minutes Intravenous  Once 08/12/20 2330 08/13/20 0107      PRN meds:    Objective: Vitals:   08/14/20 0234 08/14/20 0518  BP: (!) 119/53 126/73  Pulse: 73 72  Resp: 20 18  Temp: (!) 97.5 F (36.4 C) 97.8 F (36.6 C)  SpO2: 99%     Intake/Output Summary (Last 24 hours) at 08/14/2020 1102 Last data filed at 08/14/2020 0651 Gross per 24 hour  Intake 1622.68 ml  Output --  Net 1622.68 ml   Filed Weights   08/12/20 2115 08/13/20 1800  Weight: 40.8 kg 43.2 kg   Weight change: 2.376 kg Body mass index is 16.35 kg/m.   Physical Exam: General exam: Appears calm and comfortable.  Not in physical distress Skin:  No rashes, lesions or ulcers. HEENT: Atraumatic, normocephalic, supple neck, no obvious bleeding Lungs: Clear to auscultation bilaterally CVS: Regular rate and rhythm, no murmur GI/Abd soft, nontender, nondistended, bowel sound present CNS: Awake, alert, oriented to place person and time. Psychiatry: Depressed look Extremities: No pedal edema, no calf tenderness, left heel pressure wound small size.  Data Review: I have personally reviewed the laboratory data and studies available.  Recent Labs  Lab 08/12/20 2204 08/13/20 0451 08/14/20 0703  WBC 9.9 7.8 8.4  NEUTROABS  --   --  6.6  HGB 12.9 10.3* 10.8*  HCT 38.9 31.4* 33.8*  MCV 99.7 100.6* 102.7*  PLT 327 224 204   Recent Labs  Lab 08/12/20 2204 08/12/20 2347 08/13/20 0451 08/13/20 0600 08/14/20 0703  NA 141  --  139  --  142  K 2.4*  --  3.2*  --  3.3*  CL 101  --  107  --  114*  CO2 21*  --  24  --  21*  GLUCOSE 168*  --  81  --  72  BUN 36*  --  32*  --  23  CREATININE 2.60*  --  2.15*  --  1.55*  CALCIUM 9.5  --  8.3*  --  8.1*  MG  --  2.2  --   --   --   PHOS  --   --   --  4.2  --     F/u labs ordered  Signed, Lorin Glass, MD [facility] 08/14/2020

## 2020-08-14 NOTE — Progress Notes (Signed)
Initial Nutrition Assessment  DOCUMENTATION CODES:   Underweight, Severe malnutrition in context of acute illness/injury  INTERVENTION:  DYS 3 diet  Continue Ensure Enlive po BID, each supplement provides 350 kcal and 20 grams of protein (vanilla)  Magic cup BID with meals, each supplement provides 290 kcal and 9 grams of protein  Ocuvite daily for wound healing (provides zinc, vitamin A, vitamin C, Vitamin E, copper, and selenium)  Encouraged po intake of meals and supplements  NUTRITION DIAGNOSIS:   Severe Malnutrition related to acute illness (residual left-sided weakness s/p ischemic stroke on 8/25) as evidenced by energy intake < 75% for > 7 days, moderate fat depletion, moderate muscle depletion, severe muscle depletion, percent weight loss.  GOAL:   Patient will meet greater than or equal to 90% of their needs    MONITOR:   PO intake, Supplement acceptance, Weight trends, I & O's, Labs  REASON FOR ASSESSMENT:   Malnutrition Screening Tool, Consult Assessment of nutrition requirement/status  ASSESSMENT:  86 year old female with history significant of HTN, paroxysmal Afib s/p TAVR, HLD, dCHF, CAD s/p multiple PCIs, CKD stage 3,and recent hospital admission (8/25) for CVA and discharged to SNF presented with several day history of generalized weakness, decreased appetite over the last couple of days and several episodes of diarrhea the day prior, now resolved admitted for generalized weakness.  Patient sitting in chair eating lunch, daughter present in room this afternoon. Patient had eaten less than half of a sandwich, a few bites of fruit and drank half of chocolate Ensure, reports unable to take another bite and is tired of chewing. Patient reports chewing difficulties secondary to ongoing left sided weakness/numbness s/p stroke last month. She endorses decreased appetite over the past few weeks recalling bites of meals, started appetite stimulant this admission. RD  encouraged small frequent meals/snacks throughout the day vs 3 larger meals and recommended 1-2 nutrient dense supplements daily, eating softer foods and chopped meats to ease burden of chewing. Pt agreeable to DYS 3 mechanically altered diet during admission.   Patient recalls usually weighing 111-115 lbs, Per chart, weights have trended down ~15 lbs (13.4%) over the past 7 months and 3 lbs (3.1%) in the 2 weeks; significant. Patient is underweight, given dietary recall, weight trends, as well as moderate/severe fat and muscle depletions noted on exam, pt meets criteria for acute malnutrition.   Medications reviewed and include: D3, Remeron, Senokot IVPB: Rocephin IVF: NaCl @ 50 ml/hr  Labs: K 3.3 (L), Cr 1.55 (H) trending down, Hgb 10.8 (L), HCT 33.8 (L)  NUTRITION - FOCUSED PHYSICAL EXAM: Moderate fat depletion to buccal and orbital region; Moderate muscle depletion to temple; dorsal hand; Severe muscle depletion to clavicle region   Diet Order:   Diet Order            Diet Heart Room service appropriate? Yes; Fluid consistency: Thin  Diet effective now                 EDUCATION NEEDS:   Education needs have been addressed  Skin:  Skin Assessment: Skin Integrity Issues: Skin Integrity Issues:: Other (Comment) Other: PI;L;heel  Last BM:  pta  Height:   Ht Readings from Last 1 Encounters:  08/13/20 5\' 4"  (1.626 m)    Weight:   Wt Readings from Last 1 Encounters:  08/13/20 43.2 kg    Ideal Body Weight:  54.5 kg  BMI:  Body mass index is 16.35 kg/m.  Estimated Nutritional Needs:   Kcal:  1300-1500  Protein:  65-75  Fluid:  >/= 1.2 L   Lars Masson, RD, LDN Clinical Nutrition After Hours/Weekend Pager # in Amion

## 2020-08-14 NOTE — TOC Initial Note (Signed)
Transition of Care [facility]) - Initial/Assessment Note    Patient Details  Name: Catherine Anthony MRN: [id number] Date of Birth: 1930-07-18  Transition of Care [facility]) CM/SW Contact:    Salome Arnt, [place] Phone Number: 08/14/2020, 2:03 PM  Clinical Narrative:  Pt admitted due to severe sepsis.  LCSW met with pt at bedside, but she was unsure of some information and asked LCSW to speak with her daughter, Tessie Fass. Pt discharged recently from [facility]. She lives with her daughter who provides around the clock care. Pt is nonambulatory and requires 2 person assist for transfers. Pt's other daughter and granddaughter live nearby and come to help when needed. SNF arranged home health RN, PT, and OT through [facility]. LCSW notified Cassie with [facility] of admission. Pt and daughter plan on pt returning home when medically stable. TOC will continue to follow.                 Expected Discharge Plan: [place] Barriers to Discharge: Continued Medical Work up   Patient Goals and CMS Choice        Expected Discharge Plan and Services Expected Discharge Plan: [place] In-house Referral: Clinical Social Work   Post Acute Care Choice: [place] arrangements for the past 2 months: [place]: RN, PT, OT HH Agency: [facility] Date [place]: 08/14/20 Time HH Agency Contacted: 45 Representative spoke with at [place]- [facility]  Prior Living Arrangements/Services Living arrangements for the past 2 months: [place] with:: Adult Children Patient language and need for interpreter reviewed:: Yes Do you feel safe going back to the place where you live?: Yes        Care giver support system in place?: Yes (comment) Current home services: DME, Home OT, Home PT, Home RN (wheelchair, 3N1) Criminal Activity/Legal Involvement Pertinent to Current  Situation/Hospitalization: No - Comment as needed  Activities of Daily Living Home Assistive Devices/Equipment: Wheelchair, Transfer board, Tub transfer bench, Shower chair without back, Raised toilet seat with rails ADL Screening (condition at time of admission) Patient's cognitive ability adequate to safely complete daily activities?: Yes Is the patient deaf or have difficulty hearing?: No Does the patient have difficulty seeing, even when wearing glasses/contacts?: No Does the patient have difficulty concentrating, remembering, or making decisions?: No Patient able to express need for assistance with ADLs?: Yes Does the patient have difficulty dressing or bathing?: Yes Independently performs ADLs?: No Communication: Independent Dressing (OT): Dependent Is this a change from baseline?: Pre-admission baseline Grooming: Dependent Is this a change from baseline?: Pre-admission baseline Feeding: Needs assistance Is this a change from baseline?: Pre-admission baseline Bathing: Dependent Is this a change from baseline?: Pre-admission baseline Toileting: Dependent Is this a change from baseline?: Pre-admission baseline In/Out Bed: Dependent Is this a change from baseline?: Pre-admission baseline Walks in Home: Dependent Is this a change from baseline?: Pre-admission baseline Does the patient have difficulty walking or climbing stairs?: Yes Weakness of Legs: Left Weakness of Arms/Hands: Left  Permission Sought/Granted                  Emotional Assessment Appearance:: Appears stated age       Alcohol / Substance Use: Not Applicable Psych Involvement: No (comment)  Admission diagnosis:  Dehydration [E86.0] Hypokalemia [  E87.6] Wound cellulitis [L03.90] Generalized weakness [R53.1] Patient Active Problem List   Diagnosis Date Noted  . Nausea & vomiting 08/13/2020  . Lactic acidosis 08/13/2020  . Hyperglycemia 08/13/2020  . Dehydration 08/13/2020  . AKI (acute kidney  injury) ([place]) 08/13/2020  . Elevated troponin 08/13/2020  . Prolonged QT interval 08/13/2020  . Diarrhea 08/13/2020  . Open wound of left heel 08/13/2020  . Hypotension 08/13/2020  . Hypothermia 08/13/2020  . Generalized weakness 08/12/2020  . Ischemic stroke ([place]) 07/25/2020  . Left-sided weakness 07/24/2020  . Acute left-sided weakness   . Hypokalemia   . Leg edema, left 12/11/2019  . Frequent unifocal PVCs 08/09/2019  . Indication present for endocarditis prophylaxis 08/09/2019  . Mixed hyperlipidemia 08/01/2019  . Acute on chronic diastolic heart failure ([place]) 06/06/2019  . S/P TAVR (transcatheter aortic valve replacement) 06/06/2019  . Essential hypertension   . High cholesterol   . Coronary artery disease   . PAF (paroxysmal atrial fibrillation) ([place])   . Moderate aortic regurgitation   . Coronary artery disease with stable angina pectoris ([place]) 08/31/2017   PCP:  Deland Pretty, MD Pharmacy:   [place], [place] [address] [place] [address] Phone: [phone] Fax: [phone]     Social Determinants of Health (SDOH) Interventions    Readmission Risk Interventions Readmission Risk Prevention Plan 07/26/2020  Post Dischage Appt Complete  Medication Screening Complete  Transportation Screening Complete  Some recent data might be hidden

## 2020-08-14 NOTE — Progress Notes (Addendum)
Physical Therapy Treatment Patient Details Name: Catherine Anthony MRN: [id number] DOB: [birth year] Today's Date: 08/14/2020    History of Present Illness Catherine Anthony is a 86 y.o. female with medical history significant for hypertension, paroxysmal atrial fibrillation, s/p TAVR July 2020, hyperlipidemia, diastolic CHF and recent CVA with left-sided weakness who presents to the emergency department via EMS due to several days of generalized weakness.  Patient states that she has had decreased appetite for the last couple of days, she endorsed several episodes of diarrhea yesterday (but this has since resolved) and she admitted to nausea and nonbloody vomiting x1 today.  Patient has several days of left heel superficial wound which she has been taking care of at home.  She was recently discharged from this facility in August of this year due to acute ischemic/embolic stroke.  Patient states that daughter thought that she looked sick, so she activated 911 for her  to be taken to the ED for further evaluation and management.  She denies chest pain, shortness of breath, fever, headache or blurry vision.  Patient states that she has not had any of the Covid vaccines.    PT Comments    The patient performed supine to sit transfer mod/max assist today, with the LUE hanging flaccid. She was able to use RUE to assist in transfer. She demonstrated left lateral lean during upright sitting with the occasional LOB requiring assist from PT. Patient demonstrated lateral scooting max assist with RUE/RLE assisting. She performed stand-pivot transfer max assist with the ability to power up through RLE in first half of transfer but unable to contribute to supporting the majority of her body weight. Patient tolerated sitting up in chair with daughter present at bedside after therapy-RN notified. PLAN: The patient will continue to benefit from skilled physical therapy services in hospital at recommended venue below in order  to improve balance, gait, and ADL's to promote independence in functional activities.    Follow Up Recommendations  Home health PT;Supervision - Intermittent;Supervision for mobility/OOB     Equipment Recommendations  Other (comment)    Recommendations for Other Services       Precautions / Restrictions Precautions Precautions: Fall Precaution Comments: left side hemiplegia Restrictions Weight Bearing Restrictions: No    Mobility  Bed Mobility Overal bed mobility: Needs Assistance Bed Mobility: Supine to Sit     Supine to sit: Mod assist     General bed mobility comments: unable to use LUE due to flaccid, slow labored movement  Transfers Overall transfer level: Needs assistance Equipment used: None Transfers: Sit to/from [employer];Lateral/Scoot Transfers Sit to Stand: Mod assist Stand pivot transfers: Max assist      Lateral/Scoot Transfers: Max assist General transfer comment: LUE hanging at side without use during transfer; pt able to power up using RLE but unable to fully bear weight through it  Ambulation/Gait                 Stairs             Wheelchair Mobility    Modified Rankin (Stroke Patients Only)       Balance Overall balance assessment: Needs assistance Sitting-balance support: Single extremity supported;Feet supported Sitting balance-Leahy Scale: Fair Sitting balance - Comments: fair seated EOB Postural control: Left lateral lean Standing balance support: No upper extremity supported;During functional activity Standing balance-Leahy Scale: Poor Standing balance comment: standing with 2 hands max assist  Cognition Arousal/Alertness: Awake/alert Behavior During Therapy: WFL for tasks assessed/performed Overall Cognitive Status: Within Functional Limits for tasks assessed                                        Exercises      General Comments         Pertinent Vitals/Pain Pain Assessment: No/denies pain    Home Living                      Prior Function            PT Goals (current goals can now be found in the care plan section) Acute Rehab PT Goals Patient Stated Goal: return home with family to assist PT Goal Formulation: With patient Time For Goal Achievement: 08/20/20 Potential to Achieve Goals: Fair Progress towards PT goals: Progressing toward goals    Frequency    Min 3X/week      PT Plan      Co-evaluation              AM-PAC PT "6 Clicks" Mobility   Outcome Measure  Help needed turning from your back to your side while in a flat bed without using bedrails?: A Lot Help needed moving from lying on your back to sitting on the side of a flat bed without using bedrails?: A Lot Help needed moving to and from a bed to a chair (including a wheelchair)?: A Lot Help needed standing up from a chair using your arms (e.g., wheelchair or bedside chair)?: A Lot Help needed to walk in hospital room?: Total Help needed climbing 3-5 steps with a railing? : Total 6 Click Score: 10    End of Session   Activity Tolerance: Patient tolerated treatment well Patient left: in chair;with call bell/phone within reach;with chair alarm set;with family/visitor present Nurse Communication: Mobility status PT Visit Diagnosis: Unsteadiness on feet (R26.81);Other abnormalities of gait and mobility (R26.89);Muscle weakness (generalized) (M62.81) Hemiplegia - Right/Left: Left Hemiplegia - dominant/non-dominant: Non-dominant Hemiplegia - caused by: Cerebral infarction     Time: 1120-1150 PT Time Calculation (min) (ACUTE ONLY): 30 min  Charges:  $Therapeutic Activity: 23-37 mins                     3:53 PM , 08/14/20 Lorin Picket, SPT Physical Therapy with [place]  [facility] [phone] office  During this treatment session, the therapist was present, participating in and directing the  treatment.  3:53 PM, 08/14/20 Ocie Bob, MPT Physical Therapist with [facility] [phone] office [phone] mobile phone

## 2020-08-15 DIAGNOSIS — R6520 Severe sepsis without septic shock: Secondary | ICD-10-CM

## 2020-08-15 DIAGNOSIS — A419 Sepsis, unspecified organism: Secondary | ICD-10-CM | POA: Diagnosis present

## 2020-08-15 DIAGNOSIS — K529 Noninfective gastroenteritis and colitis, unspecified: Secondary | ICD-10-CM | POA: Diagnosis present

## 2020-08-15 DIAGNOSIS — E43 Unspecified severe protein-calorie malnutrition: Secondary | ICD-10-CM | POA: Insufficient documentation

## 2020-08-15 LAB — CBC WITH DIFFERENTIAL/PLATELET
Abs Immature Granulocytes: 0.03 10*3/uL (ref 0.00–0.07)
Basophils Absolute: 0 10*3/uL (ref 0.0–0.1)
Basophils Relative: 0 %
Eosinophils Absolute: 0.1 10*3/uL (ref 0.0–0.5)
Eosinophils Relative: 2 %
HCT: 31.3 % — ABNORMAL LOW (ref 36.0–46.0)
Hemoglobin: 10 g/dL — ABNORMAL LOW (ref 12.0–15.0)
Immature Granulocytes: 1 %
Lymphocytes Relative: 19 %
Lymphs Abs: 1.2 10*3/uL (ref 0.7–4.0)
MCH: 32.6 pg (ref 26.0–34.0)
MCHC: 31.9 g/dL (ref 30.0–36.0)
MCV: 102 fL — ABNORMAL HIGH (ref 80.0–100.0)
Monocytes Absolute: 0.5 10*3/uL (ref 0.1–1.0)
Monocytes Relative: 8 %
Neutro Abs: 4.6 10*3/uL (ref 1.7–7.7)
Neutrophils Relative %: 70 %
Platelets: 167 10*3/uL (ref 150–400)
RBC: 3.07 MIL/uL — ABNORMAL LOW (ref 3.87–5.11)
RDW: 13.2 % (ref 11.5–15.5)
WBC: 6.5 10*3/uL (ref 4.0–10.5)
nRBC: 0 % (ref 0.0–0.2)

## 2020-08-15 LAB — BLOOD CULTURE ID PANEL (REFLEXED) - BCID2

## 2020-08-15 LAB — BASIC METABOLIC PANEL
Anion gap: 6 (ref 5–15)
BUN: 15 mg/dL (ref 8–23)
CO2: 19 mmol/L — ABNORMAL LOW (ref 22–32)
Calcium: 8 mg/dL — ABNORMAL LOW (ref 8.9–10.3)
Chloride: 115 mmol/L — ABNORMAL HIGH (ref 98–111)
Creatinine, Ser: 1.16 mg/dL — ABNORMAL HIGH (ref 0.44–1.00)
GFR calc Af Amer: 49 mL/min — ABNORMAL LOW (ref >60)
GFR calc non Af Amer: 42 mL/min — ABNORMAL LOW (ref >60)
Glucose, Bld: 88 mg/dL (ref 70–99)
Potassium: 3.3 mmol/L — ABNORMAL LOW (ref 3.5–5.1)
Sodium: 140 mmol/L (ref 135–145)

## 2020-08-15 MED ORDER — SACCHAROMYCES BOULARDII 250 MG PO CAPS: 250.0000 mg | ORAL_CAPSULE | Freq: Two times a day (BID) | ORAL | 0 refills | Status: AC

## 2020-08-15 MED ORDER — CEFDINIR 300 MG PO CAPS: 300.0000 mg | ORAL_CAPSULE | Freq: Two times a day (BID) | ORAL | 0 refills | Status: AC

## 2020-08-15 MED ORDER — ENSURE ENLIVE PO LIQD: 237.0000 mL | Freq: Two times a day (BID) | ORAL | 12 refills | Status: DC

## 2020-08-15 MED ORDER — METOPROLOL TARTRATE 25 MG PO TABS: 12.5000 mg | ORAL_TABLET | Freq: Two times a day (BID) | ORAL | 0 refills | Status: DC

## 2020-08-15 MED ORDER — POTASSIUM CHLORIDE CRYS ER 20 MEQ PO TBCR
40.0000 meq | EXTENDED_RELEASE_TABLET | Freq: Once | ORAL | Status: AC
  Administered 2020-08-15: 40 meq via ORAL
  Filled 2020-08-15: qty 2

## 2020-08-15 MED ORDER — VANCOMYCIN HCL 750 MG/150ML IV SOLN
750.0000 mg | Freq: Once | INTRAVENOUS | Status: AC
  Administered 2020-08-15: 750 mg via INTRAVENOUS
  Filled 2020-08-15: qty 150

## 2020-08-15 MED ORDER — VANCOMYCIN HCL 500 MG/100ML IV SOLN: 500.0000 mg | INTRAVENOUS | Status: DC

## 2020-08-15 MED ORDER — OCUVITE-LUTEIN PO CAPS: 1.0000 | ORAL_CAPSULE | Freq: Every day | ORAL | 0 refills | Status: AC

## 2020-08-15 NOTE — Progress Notes (Signed)
CRITICAL VALUE ALERT  Critical Value:  Blood cultures: gram + cocci x1 aerobic bottle  Date & Time Notied:  08/15/2020 0135  Provider Notified: Netta Neat, DO  Orders Received/Actions taken: awaiting orders

## 2020-08-15 NOTE — Discharge Summary (Signed)
Physician Discharge Summary  Catherine Anthony:[id number] DOB: Jun 07, 1930 DOA: 08/12/2020  PCP: [name], MD  Admit date: 08/12/2020 Discharge date: 08/15/2020  Admitted From: Home Discharge disposition: Home with PT, RN   Code Status: Full Code  Diet Recommendation: Cardiac diet  Discharge Diagnosis:   Principal Problem:   Severe sepsis (HCC) Active Problems:   Hypotension   Acute gastroenteritis   Essential hypertension   S/P TAVR (transcatheter aortic valve replacement)   PAF (paroxysmal atrial fibrillation) (HCC)   Mixed hyperlipidemia   Left-sided weakness   Hypokalemia   Ischemic stroke (HCC)   Generalized weakness   Nausea & vomiting   Lactic acidosis   Hyperglycemia   Dehydration   AKI (acute kidney injury) (HCC)   Elevated troponin   Prolonged QT interval   Diarrhea   Open wound of left heel   Hypothermia   Protein-calorie malnutrition, severe   History of Present Illness / Brief narrative:  Catherine M Carteris a 86 y.o.femalewith medical history significant forhypertension, paroxysmal atrial fibrillation,s/p TAVR July 2020, hyperlipidemia, diastolic CHFand recent CVA with left-sided weakness. Patient presented to the ED on 9/13 with several day history of poor oral intake, nausea, vomiting, diarrhea, generalized weakness.  She was recently hospitalized in August for ischemic stroke and was discharged to home where she was progressively declining and hence brought to the ED.  In the ED, patient was hypothermic at 92.9F, tachypneic at 20s blood pressure was soft. Labs showed WBC count normal, potassium low at 2.4, creatinine elevated 2.6, lactic acid elevated to 4.7. Covid PCR negative Chest x-ray showed COPD without acute airspace disease Patient was admitted to hospital service for further evaluation management  Subjective:  Seen and examined this morning.  Pleasant elderly Caucasian female.  Not in distress.  Wants to go home  Hospital  Course:  Severe sepsis with hypotension -Source: Gastroenteritis versus left leg wound -Presented with hypothermia, tachypnea, hypotension, lactic acidosis, AKI. -Admitted for sepsis.  With IV antibiotics, adequate IV fluids, sepsis parameters are improving.   -Blood culture did not show any true infection. -Currently on IV Rocephin.   Will discharge home on 5 more days of oral Omnicef with probiotics. Recent Labs  Lab 08/12/20 2204 08/12/20 2347 08/13/20 0341 08/13/20 0451 08/14/20 0703 08/15/20 0658  WBC 9.9  --   --  7.8 8.4 6.5  LATICACIDVEN 4.7* 3.0*  --   --  0.8  --   PROCALCITON  --   --  <0.10  --   --   --    Acute gastroenteritis -Presented with nausea, vomiting, diarrhea.  -Evidently no recurrence of symptoms since admission.  Superficial left heel wound -left foot x-ray did not show any evidence of acute bony involvement.   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 -Improving creatinine with hydration.  Back to baseline today. -Continue to monitor as an outpatient. Recent Labs    07/24/20 1205 07/24/20 1210 07/25/20 0623 08/12/20 2204 08/13/20 0451 08/14/20 0703 08/15/20 0658  BUN 18 17 16  36* 32* 23 15  CREATININE 1.26* 1.30* 1.13* 2.60* 2.15* 1.55* 1.16*   Hypokalemia -Secondary to diarrhea.  Potassium level is low again at 3.3 this morning.  Oral replacement given. Recent Labs  Lab 08/12/20 2204 08/12/20 2347 08/13/20 0451 08/13/20 0600 08/14/20 0703 08/15/20 0658  K 2.4*  --  3.2*  --  3.3* 3.3*  MG  --  2.2  --   --   --   --   PHOS  --   --   --  4.2  --   --    HTN -Home meds include metoprolol 25 mg twice daily, Lasix 20 mg daily, hydralazine 25 mg 3 times daily, lisinopril 40 mg daily.  However patient's daughter states that they have been self holding some medications because of low blood pressure at home -In this admission  because of evidence of sepsis, blood pressure has been running in the low normal range. -Currently on metoprolol half dose at 12.5 mg twice daily while others remain on hold.  I have instructed patient and her daughter to continue to monitor blood pressure at home and resume medicines one at a time if blood pressure exceeds 140/90.  If patient's level of hydration remains compromised, Lasix can be switched to as needed.  Afib -Continue metoprolol at a lower dose. Continue Eliquis. -Patient is also on amiodarone 200 mg on Monday and Friday.  history of ischemic stroke HLD -Continue Eliquis and statin  Prolonged QTc -Initial EKG with QTc51ms -Improving with correction of electrolytes.  Elevated troponin  -Troponin63> 61,patient denies any chest pain -Likely demand ischemia due to sepsis.   Hyperglycemia -Known history of diabetes mellitus -A1c 5.1 on 8/25  Generalized weakness -Multifactorial: Severe sepsis, poor oral intake, diarrhea, low electrolytes, physical deconditioning, recent stroke -Issues addressed individually -PT eval recommended home health PT  Severe malnutrition -in context of acute illness/injury -Nutrition consult appreciated.  Nutritional supplement started.  Stable for discharge home today with home health PT, RN.  Wound care: Pressure Injury 08/13/20 Heel Left Deep Tissue Pressure Injury - Purple or maroon localized area of discolored intact skin or blood-filled blister due to damage of underlying soft tissue from pressure and/or shear. pt states she developed this while at r (Active)  Date First Assessed/Time First Assessed: 08/13/20 1905   Location: Heel  Location Orientation: Left  Staging: Deep Tissue Pressure Injury - Purple or maroon localized area of discolored intact skin or blood-filled blister due to damage of underlying s...    Assessments 08/14/2020  8:15 AM  Site / Wound Assessment Dry;Purple;Painful  Drainage Amount None     No Linked  orders to display    Discharge Exam:   Vitals:   08/14/20 1100 08/14/20 1428 08/14/20 2013 08/15/20 0631  BP: (!) 135/58 127/63 121/67 135/64  Pulse: 79 71 71 65  Resp: 16 16 20 16   Temp:  (!) 97.5 F (36.4 C) 99 F (37.2 C) 98.1 F (36.7 C)  TempSrc:  Oral    SpO2: 100% 98% 99% 99%  Weight:      Height:        Body mass index is 16.35 kg/m.  General exam: Appears calm and comfortable.  Not in physical distress Skin: No rashes, lesions or ulcers. HEENT: Atraumatic, normocephalic, supple neck, no obvious bleeding Lungs: Clear to auscultation bilaterally CVS: Regular rate and rhythm, no murmur GI/Abd soft, nontender, nondistended, bowel sound present CNS: Alert, awake, oriented to place and person Psychiatry: Mood appropriate Extremities: No pedal edema, no calf tenderness  Follow ups:   Discharge Instructions    Increase activity slowly   Complete by: As directed    Leave dressing on - Keep it clean, dry, and intact until clinic visit   Complete by: As directed       Follow-up Information    , MD Follow up.   Specialty: Internal Medicine  Contact information: [address] [place] [address] [place] [address] [phone]               Recommendations for Outpatient Follow-Up:   1. Follow-up with PCP as an outpatient  Discharge Instructions:  Follow with Primary MD [name], MD in 7 days   Get CBC/BMP checked in next visit within 1 week by PCP or SNF MD ( we routinely change or add medications that can affect your baseline labs and fluid status, therefore we recommend that you get the mentioned basic workup next visit with your PCP, your PCP may decide not to get them or add new tests based on their clinical decision)  On your next visit with your PCP, please Get Medicines reviewed and adjusted.  Please request your PCP  to go over all Hospital Tests and Procedure/Radiological results at the follow up, please get all Hospital  records sent to your Prim MD by signing hospital release before you go home.  Activity: As tolerated with Full fall precautions use walker/cane & assistance as needed  For Heart failure patients - Check your Weight same time everyday, if you gain over 2 pounds, or you develop in leg swelling, experience more shortness of breath or chest pain, call your Primary MD immediately. Follow Cardiac Low Salt Diet and 1.5 lit/day fluid restriction.  If you have smoked or chewed Tobacco in the last 2 yrs please stop smoking, stop any regular Alcohol  and or any Recreational drug use.  If you experience worsening of your admission symptoms, develop shortness of breath, life threatening emergency, suicidal or homicidal thoughts you must seek medical attention immediately by calling 911 or calling your MD immediately  if symptoms less severe.  You Must read complete instructions/literature along with all the possible adverse reactions/side effects for all the Medicines you take and that have been prescribed to you. Take any new Medicines after you have completely understood and accpet all the possible adverse reactions/side effects.   Do not drive, operate heavy machinery, perform activities at heights, swimming or participation in water activities or provide baby sitting services if your were admitted for syncope or siezures until you have seen by Primary MD or a Neurologist and advised to do so again.  Do not drive when taking Pain medications.  Do not take more than prescribed Pain, Sleep and Anxiety Medications  Wear Seat belts while driving.   Please note You were cared for by a hospitalist during your hospital stay. If you have any questions about your discharge medications or the care you received while you were in the hospital after you are discharged, you can call the unit and asked to speak with the hospitalist on call if the hospitalist that took care of you is not available. Once you are  discharged, your primary care physician will handle any further medical issues. Please note that NO REFILLS for any discharge medications will be authorized once you are discharged, as it is imperative that you return to your primary care physician (or establish a relationship with a primary care physician if you do not have one) for your aftercare needs so that they can reassess your need for medications and monitor your lab values.    Allergies as of 08/15/2020      Reactions   Codeine Nausea And Vomiting      Medication List    STOP taking these medications   furosemide 20 MG tablet Commonly known as: LASIX   hydrALAZINE 25 MG  tablet Commonly known as: APRESOLINE   lisinopril 40 MG tablet Commonly known as: ZESTRIL     TAKE these medications   amiodarone 200 MG tablet Commonly known as: PACERONE Take 1 tablet (200 mg total) by mouth as directed. Monday and Friday What changed:   when to take this  additional instructions   apixaban 2.5 MG Tabs tablet Commonly known as: Eliquis TAKE (1) TABLET BY MOUTH TWICE DAILY. What changed:   how much to take  how to take this  when to take this  additional instructions   cefdinir 300 MG capsule Commonly known as: OMNICEF Take 1 capsule (300 mg total) by mouth 2 (two) times daily for 5 days.   EYE DROPS ALLERGY RELIEF OP Place 1 drop into both eyes 3 (three) times daily as needed (allergy).   feeding supplement (ENSURE ENLIVE) Liqd Take 237 mLs by mouth 2 (two) times daily between meals.   metoprolol tartrate 25 MG tablet Commonly known as: LOPRESSOR Take 0.5 tablets (12.5 mg total) by mouth 2 (two) times daily. TAKE (1) TABLET BY MOUTH TWICE DAILY. What changed:   how much to take  how to take this  when to take this   mirtazapine 15 MG tablet Commonly known as: REMERON Take 0.5 tablets (7.5 mg total) by mouth at bedtime.   multivitamin-lutein Caps capsule Take 1 capsule by mouth daily. Start taking on:  August 16, 2020   nitroGLYCERIN 0.4 MG SL tablet Commonly known as: NITROSTAT Place 1 tablet (0.4 mg total) under the tongue every 5 (five) minutes x 3 doses as needed for chest pain.   rosuvastatin 20 MG tablet Commonly known as: CRESTOR TAKE (1) TABLET BY MOUTH ONCE DAILY. What changed:   how much to take  how to take this  when to take this  additional instructions   saccharomyces boulardii 250 MG capsule Commonly known as: FLORASTOR Take 1 capsule (250 mg total) by mouth 2 (two) times daily.   Vitamin D3 50 MCG (2000 UT) Tabs Take 2 tablets by mouth daily.            Discharge Care Instructions  (From admission, onward)         Start     Ordered   08/15/20 0000  Leave dressing on - Keep it clean, dry, and intact until clinic visit        08/15/20 1047          Time coordinating discharge: 35 minutes  The results of significant diagnostics from this hospitalization (including imaging, microbiology, ancillary and laboratory) are listed below for reference.    Procedures and Diagnostic Studies:   DG Chest Portable 1 View  Result Date: 08/12/2020 CLINICAL DATA:  Dyspnea EXAM: PORTABLE CHEST 1 VIEW COMPARISON:  07/24/2020 FINDINGS: The lungs are hyperinflated with diffuse interstitial prominence. No focal airspace consolidation or pulmonary edema. No pleural effusion or pneumothorax. Normal cardiomediastinal contours. Status post TAVR. IMPRESSION: COPD without acute airspace disease. Electronically Signed   By: Deatra Robinson M.D.   On: 08/12/2020 23:18   DG Foot 2 Views Left  Result Date: 08/13/2020 CLINICAL DATA:  Cellulitis EXAM: LEFT FOOT - 2 VIEW COMPARISON:  None. FINDINGS: Plantar calcaneal spur. No acute bony abnormality. Specifically, no fracture, subluxation, or dislocation. No bone destruction to suggest osteomyelitis. IMPRESSION: No acute bony abnormality. Electronically Signed   By: Charlett Nose M.D.   On: 08/13/2020 08:54     Labs:   Basic  Metabolic Panel: Recent Labs  Lab  08/12/20 2204 08/12/20 2204 08/12/20 2347 08/13/20 0451 08/13/20 0451 08/13/20 0600 08/14/20 0703 08/15/20 0658  NA 141  --   --  139  --   --  142 140  K 2.4*   < >  --  3.2*   < >  --  3.3* 3.3*  CL 101  --   --  107  --   --  114* 115*  CO2 21*  --   --  24  --   --  21* 19*  GLUCOSE 168*  --   --  81  --   --  72 88  BUN 36*  --   --  32*  --   --  23 15  CREATININE 2.60*  --   --  2.15*  --   --  1.55* 1.16*  CALCIUM 9.5  --   --  8.3*  --   --  8.1* 8.0*  MG  --   --  2.2  --   --   --   --   --   PHOS  --   --   --   --   --  4.2  --   --    < > = values in this interval not displayed.   GFR Estimated Creatinine Clearance: 22.9 mL/min (A) (by C-G formula based on SCr of 1.16 mg/dL (H)). Liver Function Tests: Recent Labs  Lab 08/13/20 0451  AST 24  ALT 16  ALKPHOS 48  BILITOT 0.6  PROT 5.0*  ALBUMIN 2.5*   No results for input(s): LIPASE, AMYLASE in the last 168 hours. No results for input(s): AMMONIA in the last 168 hours. Coagulation profile Recent Labs  Lab 08/13/20 0451  INR 1.5*    CBC: Recent Labs  Lab 08/12/20 2204 08/13/20 0451 08/14/20 0703 08/15/20 0658  WBC 9.9 7.8 8.4 6.5  NEUTROABS  --   --  6.6 4.6  HGB 12.9 10.3* 10.8* 10.0*  HCT 38.9 31.4* 33.8* 31.3*  MCV 99.7 100.6* 102.7* 102.0*  PLT 327 224 204 167   Cardiac Enzymes: No results for input(s): CKTOTAL, CKMB, CKMBINDEX, TROPONINI in the last 168 hours. BNP: Invalid input(s): POCBNP CBG: Recent Labs  Lab 08/12/20 2131  GLUCAP 155*   D-Dimer No results for input(s): DDIMER in the last 72 hours. Hgb A1c No results for input(s): HGBA1C in the last 72 hours. Lipid Profile No results for input(s): CHOL, HDL, LDLCALC, TRIG, CHOLHDL, LDLDIRECT in the last 72 hours. Thyroid function studies Recent Labs    08/12/20 2204  TSH 2.813   Anemia work up No results for input(s): VITAMINB12, FOLATE, FERRITIN, TIBC, IRON, RETICCTPCT in the last 72  hours. Microbiology Recent Results (from the past 240 hour(s))  SARS Coronavirus 2 by RT PCR (hospital order, performed in [facility]) Nasopharyngeal Nasopharyngeal Swab     Status: None   Collection Time: 08/12/20 11:19 PM   Specimen: Nasopharyngeal Swab  Result Value Ref Range Status   SARS Coronavirus 2 NEGATIVE NEGATIVE Final    Comment: (NOTE) SARS-CoV-2 target nucleic acids are NOT DETECTED.  The SARS-CoV-2 RNA is generally detectable in upper and lower respiratory specimens during the acute phase of infection. The lowest concentration of SARS-CoV-2 viral copies this assay can detect is 250 copies / mL. A negative result does not preclude SARS-CoV-2 infection and should not be used as the sole basis for treatment or other patient management decisions.  A negative result may occur with  improper specimen collection / handling, submission of specimen other than nasopharyngeal swab, presence of viral mutation(s) within the areas targeted by this assay, and inadequate number of viral copies (<250 copies / mL). A negative result must be combined with clinical observations, patient history, and epidemiological information.  Fact Sheet for Patients:   BoilerBrush.com.cy  Fact Sheet for Healthcare Providers: https://pope.com/  This test is not yet approved or  cleared by the [place] FDA and has been authorized for detection and/or diagnosis of SARS-CoV-2 by FDA under an Emergency Use Authorization (EUA).  This EUA will remain in effect (meaning this test can be used) for the duration of the COVID-19 declaration under Section 564(b)(1) of the Act, 21 U.S.C. section 360bbb-3(b)(1), unless the authorization is terminated or revoked sooner.  Performed at [facility], [address] [place], [place] [address]   Blood culture (routine x 2)     Status: None (Preliminary result)   Collection Time: 08/12/20 11:39 PM    Specimen: BLOOD  Result Value Ref Range Status   Specimen Description   Final    BLOOD LEFT ANTECUBITAL Performed at [facility], [address], [place], [place] [address]    Special Requests   Final    BOTTLES DRAWN AEROBIC AND ANAEROBIC Blood Culture adequate volume Performed at [facility], [address] [place], [place] [address]    Culture  Setup Time   Final    AEROBIC BOTTLE ONLY GRAM POSITIVE COCCI Gram Stain Report Called to,Read Back By and Verified With: H EVANS,RN @0128  08/15/20 MKELLY Organism ID to follow Performed at [facility], [address] [place], [place]    Culture PENDING  Incomplete   Report Status PENDING  Incomplete  Blood Culture ID Panel (Reflexed)     Status: Abnormal   Collection Time: 08/12/20 11:39 PM  Result Value Ref Range Status   Enterococcus faecalis NOT DETECTED NOT DETECTED Final   Enterococcus Faecium NOT DETECTED NOT DETECTED Final   Listeria monocytogenes NOT DETECTED NOT DETECTED Final   Staphylococcus species DETECTED (A) NOT DETECTED Final    Comment: CRITICAL RESULT CALLED TO, READ BACK BY AND VERIFIED WITH: L. POOLE PHARMD, AT 0804 08/15/20 BY D. VANHOOK    Staphylococcus aureus (BCID) NOT DETECTED NOT DETECTED Final   Staphylococcus epidermidis DETECTED (A) NOT DETECTED Final    Comment: CRITICAL RESULT CALLED TO, READ BACK BY AND VERIFIED WITH: L. POOLE PHARMD, AT 0804 08/15/20 BY D. VANHOOK    Staphylococcus lugdunensis NOT DETECTED NOT DETECTED Final   Streptococcus species NOT DETECTED NOT DETECTED Final   Streptococcus agalactiae NOT DETECTED NOT DETECTED Final   Streptococcus pneumoniae NOT DETECTED NOT DETECTED Final   Streptococcus pyogenes NOT DETECTED NOT DETECTED Final   A.calcoaceticus-baumannii NOT DETECTED NOT DETECTED Final   Bacteroides fragilis NOT DETECTED NOT DETECTED Final   Enterobacterales NOT DETECTED NOT DETECTED Final   Enterobacter cloacae complex NOT DETECTED NOT DETECTED Final    Escherichia coli NOT DETECTED NOT DETECTED Final   Klebsiella aerogenes NOT DETECTED NOT DETECTED Final   Klebsiella oxytoca NOT DETECTED NOT DETECTED Final   Klebsiella pneumoniae NOT DETECTED NOT DETECTED Final   Proteus species NOT DETECTED NOT DETECTED Final   Salmonella species NOT DETECTED NOT DETECTED Final   Serratia marcescens NOT DETECTED NOT DETECTED Final   Haemophilus influenzae NOT DETECTED NOT DETECTED Final   Neisseria meningitidis NOT DETECTED NOT DETECTED Final   Pseudomonas aeruginosa NOT DETECTED NOT DETECTED Final   Stenotrophomonas  maltophilia NOT DETECTED NOT DETECTED Final   Candida albicans NOT DETECTED NOT DETECTED Final   Candida auris NOT DETECTED NOT DETECTED Final   Candida glabrata NOT DETECTED NOT DETECTED Final   Candida krusei NOT DETECTED NOT DETECTED Final   Candida parapsilosis NOT DETECTED NOT DETECTED Final   Candida tropicalis NOT DETECTED NOT DETECTED Final   Cryptococcus neoformans/gattii NOT DETECTED NOT DETECTED Final   Methicillin resistance mecA/C NOT DETECTED NOT DETECTED Final    Comment: Performed at [facility], [address], [place], [place] [address]  Blood culture (routine x 2)     Status: None (Preliminary result)   Collection Time: 08/12/20 11:47 PM   Specimen: BLOOD LEFT HAND  Result Value Ref Range Status   Specimen Description BLOOD LEFT HAND  Final   Special Requests   Final    BOTTLES DRAWN AEROBIC AND ANAEROBIC Blood Culture adequate volume   Culture   Final    NO GROWTH 1 DAY Performed at [facility], [address], [place], [place] [address]    Report Status PENDING  Incomplete     Signed: Lorren Rossetti  [facility] 08/15/2020, 10:51 AM

## 2020-08-15 NOTE — Progress Notes (Signed)
Patient with no complaints at this time. Respirations even and unlabored. Skin warm/dry. Discharge instructions reviewed with patient at this time. Patient given opportunity to voice concerns/ask questions. Patient discharged at this time and taken via wheelchair to main entrance where family picked her up.

## 2020-08-15 NOTE — Progress Notes (Signed)
PHARMACY - PHYSICIAN COMMUNICATION CRITICAL VALUE ALERT - BLOOD CULTURE IDENTIFICATION (BCID)  Catherine Anthony is an 86 y.o. female who presented to [facility] on 08/12/2020 with a chief complaint of emesis  Assessment: Patient admitted with sepsis with hypotension. Source is possible gastroenteritis versus left leg wound. Patient is improving.PCT <0.1.  BCx x 1 bottle grew staph epi, likely contaminant.   Name of physician (or Provider) Contacted: Dr. Pola Corn  Current antibiotics: Ceftriaxone and Vancomycin  Changes to prescribed antibiotics recommended:  Continue with ceftriaxone and discontinue Vancomycin  Results for orders placed or performed during the hospital encounter of 08/12/20  Blood Culture ID Panel (Reflexed) (Collected: 08/12/2020 11:39 PM)  Result Value Ref Range   Enterococcus faecalis NOT DETECTED NOT DETECTED   Enterococcus Faecium NOT DETECTED NOT DETECTED   Listeria monocytogenes NOT DETECTED NOT DETECTED   Staphylococcus species DETECTED (A) NOT DETECTED   Staphylococcus aureus (BCID) NOT DETECTED NOT DETECTED   Staphylococcus epidermidis DETECTED (A) NOT DETECTED   Staphylococcus lugdunensis NOT DETECTED NOT DETECTED   Streptococcus species NOT DETECTED NOT DETECTED   Streptococcus agalactiae NOT DETECTED NOT DETECTED   Streptococcus pneumoniae NOT DETECTED NOT DETECTED   Streptococcus pyogenes NOT DETECTED NOT DETECTED   A.calcoaceticus-baumannii NOT DETECTED NOT DETECTED   Bacteroides fragilis NOT DETECTED NOT DETECTED   Enterobacterales NOT DETECTED NOT DETECTED   Enterobacter cloacae complex NOT DETECTED NOT DETECTED   Escherichia coli NOT DETECTED NOT DETECTED   Klebsiella aerogenes NOT DETECTED NOT DETECTED   Klebsiella oxytoca NOT DETECTED NOT DETECTED   Klebsiella pneumoniae NOT DETECTED NOT DETECTED   Proteus species NOT DETECTED NOT DETECTED   Salmonella species NOT DETECTED NOT DETECTED   Serratia marcescens NOT DETECTED NOT DETECTED   Haemophilus  influenzae NOT DETECTED NOT DETECTED   Neisseria meningitidis NOT DETECTED NOT DETECTED   Pseudomonas aeruginosa NOT DETECTED NOT DETECTED   Stenotrophomonas maltophilia NOT DETECTED NOT DETECTED   Candida albicans NOT DETECTED NOT DETECTED   Candida auris NOT DETECTED NOT DETECTED   Candida glabrata NOT DETECTED NOT DETECTED   Candida krusei NOT DETECTED NOT DETECTED   Candida parapsilosis NOT DETECTED NOT DETECTED   Candida tropicalis NOT DETECTED NOT DETECTED   Cryptococcus neoformans/gattii NOT DETECTED NOT DETECTED   Methicillin resistance mecA/C NOT DETECTED NOT DETECTED   Elder Cyphers, BS Pharm D, BCPS Clinical Pharmacist Pager [phone] 08/15/2020  8:08 AM

## 2020-08-15 NOTE — Progress Notes (Signed)
OT Cancellation Note  Patient Details Name: [name] MRN: [id number] DOB: May 10, 1930   Cancelled Treatment:    Reason Eval/Treat Not Completed: OT screened, no needs identified, will sign off. Pt is currently max A +2 for transfers, daughter provides 24/7 care with other family assisting as needed. This is pt's baseline since recent CVA, family plans to return home and continue providing 24/7 care. No acute OT needs at this time. Recommend return home with resumption of HH services.    Ezra Sites, OTR/L  [phone] 08/15/2020, 9:00 AM

## 2020-08-15 NOTE — Plan of Care (Signed)

## 2020-08-15 NOTE — Plan of Care (Signed)
  Problem: Malnutrition  (NI-5.2) Goal: Food and/or nutrient delivery Description: Individualized approach for food/nutrient provision. Outcome: Adequate for Discharge   Problem: Education: Goal: Knowledge of General Education information will improve Description: Including pain rating scale, medication(s)/side effects and non-pharmacologic comfort measures 08/15/2020 1321 by Karolee Ohs, RN Outcome: Adequate for Discharge 08/15/2020 1027 by Karolee Ohs, RN Outcome: Progressing   Problem: Health Behavior/Discharge Planning: Goal: Ability to manage health-related needs will improve 08/15/2020 1321 by Karolee Ohs, RN Outcome: Adequate for Discharge 08/15/2020 1027 by Karolee Ohs, RN Outcome: Progressing   Problem: Clinical Measurements: Goal: Ability to maintain clinical measurements within normal limits will improve 08/15/2020 1321 by Karolee Ohs, RN Outcome: Adequate for Discharge 08/15/2020 1027 by Karolee Ohs, RN Outcome: Progressing Goal: Will remain free from infection Outcome: Adequate for Discharge Goal: Diagnostic test results will improve 08/15/2020 1321 by Karolee Ohs, RN Outcome: Adequate for Discharge 08/15/2020 1027 by Karolee Ohs, RN Outcome: Progressing Goal: Respiratory complications will improve Outcome: Adequate for Discharge Goal: Cardiovascular complication will be avoided Outcome: Adequate for Discharge   Problem: Activity: Goal: Risk for activity intolerance will decrease Outcome: Adequate for Discharge   Problem: Nutrition: Goal: Adequate nutrition will be maintained Outcome: Adequate for Discharge   Problem: Coping: Goal: Level of anxiety will decrease Outcome: Adequate for Discharge   Problem: Elimination: Goal: Will not experience complications related to bowel motility Outcome: Adequate for Discharge Goal: Will not experience complications related to urinary retention Outcome: Adequate for Discharge   Problem: Pain  Managment: Goal: General experience of comfort will improve Outcome: Adequate for Discharge   Problem: Safety: Goal: Ability to remain free from injury will improve Outcome: Adequate for Discharge   Problem: Skin Integrity: Goal: Risk for impaired skin integrity will decrease Outcome: Adequate for Discharge

## 2020-08-15 NOTE — Progress Notes (Signed)
Pharmacy Antibiotic Note  Catherine Anthony is a 86 y.o. female admitted on 08/12/2020 with sepsis initially treated w/ Rocephin but now growing GPC in blood cx.  Pharmacy has been consulted for vancomycin dosing.  Plan: Vancomycin 750mg  x1 then 500mg  IV every 48 hours.  Goal trough 15-20 mcg/mL.  Height: 5\' 4"  (162.6 cm) Weight: 43.2 kg (95 lb 3.8 oz) IBW/kg (Calculated) : 54.7  Temp (24hrs), Avg:98 F (36.7 C), Min:97.5 F (36.4 C), Max:99 F (37.2 C)  Recent Labs  Lab 08/12/20 2204 08/12/20 2347 08/13/20 0451 08/14/20 0703  WBC 9.9  --  7.8 8.4  CREATININE 2.60*  --  2.15* 1.55*  LATICACIDVEN 4.7* 3.0*  --  0.8    Estimated Creatinine Clearance: 17.1 mL/min (A) (by C-G formula based on SCr of 1.55 mg/dL (H)).    Allergies  Allergen Reactions  . Codeine Nausea And Vomiting    Thank you for allowing pharmacy to be a part of this patient's care.  08/14/20, PharmD, BCPS  08/15/2020 2:21 AM

## 2020-08-15 NOTE — TOC Transition Note (Signed)
Transition of Care [facility]) - CM/SW Discharge Note   Patient Details  Name: DONI BACHA MRN: [id number] Date of Birth: [birth year]  Transition of Care [facility]) CM/SW Contact:  Barry Brunner, LCSW Phone Number: 08/15/2020, 12:44 PM   Clinical Narrative:    CSW contacted [facility] to notify them of patient's discharge and HH resumption orders. Cassie with [facility] agreeable to resume orders. CSW contacted patient's PCP to schedule a follow appointment. CSW provided follow up appointment information in patient's AVS. TOC signing off.    Final next level of care: Home w Home Health Services Barriers to Discharge: Barriers Resolved   Patient Goals and CMS Choice Patient states their goals for this hospitalization and ongoing recovery are:: Return home with [facility]   Choice offered to / list presented to : Patient  Discharge Placement                  Name of family member notified: Shevaun Lovan Patient and family notified of of transfer: 08/15/20  Discharge Plan and Services In-house Referral: Clinical Social Work   Post Acute Care Choice: Home Health                    HH Arranged: PT, OT, RN HH Agency: [facility] Date [facility] Agency Contacted: 08/15/20 Time HH Agency Contacted: [phone] Representative spoke with at [facility] Agency: Cassie  Social Determinants of Health (SDOH) Interventions     Readmission Risk Interventions Readmission Risk Prevention Plan 08/15/2020 07/26/2020  Post Dischage Appt - Complete  Medication Screening - Complete  Transportation Screening Complete Complete  PCP or Specialist Appt within 3-5 Days Complete -  HRI or Home Care Consult Complete -  Social Work Consult for Recovery Care Planning/Counseling Complete -  Palliative Care Screening Not Applicable -  Medication Review Oceanographer) Complete -  Some recent data might be hidden

## 2020-08-16 DIAGNOSIS — N1832 Chronic kidney disease, stage 3b: Secondary | ICD-10-CM | POA: Diagnosis not present

## 2020-08-16 DIAGNOSIS — I69991 Dysphagia following unspecified cerebrovascular disease: Secondary | ICD-10-CM | POA: Diagnosis not present

## 2020-08-16 DIAGNOSIS — I130 Hypertensive heart and chronic kidney disease with heart failure and stage 1 through stage 4 chronic kidney disease, or unspecified chronic kidney disease: Secondary | ICD-10-CM | POA: Diagnosis not present

## 2020-08-16 DIAGNOSIS — I5030 Unspecified diastolic (congestive) heart failure: Secondary | ICD-10-CM | POA: Diagnosis not present

## 2020-08-16 DIAGNOSIS — I69354 Hemiplegia and hemiparesis following cerebral infarction affecting left non-dominant side: Secondary | ICD-10-CM | POA: Diagnosis not present

## 2020-08-16 DIAGNOSIS — I480 Paroxysmal atrial fibrillation: Secondary | ICD-10-CM | POA: Diagnosis not present

## 2020-08-16 LAB — CULTURE, BLOOD (ROUTINE X 2): Special Requests: ADEQUATE

## 2020-08-18 LAB — CULTURE, BLOOD (ROUTINE X 2)
Culture: NO GROWTH
Special Requests: ADEQUATE

## 2020-08-19 DIAGNOSIS — I69354 Hemiplegia and hemiparesis following cerebral infarction affecting left non-dominant side: Secondary | ICD-10-CM | POA: Diagnosis not present

## 2020-08-19 DIAGNOSIS — I69991 Dysphagia following unspecified cerebrovascular disease: Secondary | ICD-10-CM | POA: Diagnosis not present

## 2020-08-19 DIAGNOSIS — I480 Paroxysmal atrial fibrillation: Secondary | ICD-10-CM | POA: Diagnosis not present

## 2020-08-19 DIAGNOSIS — I130 Hypertensive heart and chronic kidney disease with heart failure and stage 1 through stage 4 chronic kidney disease, or unspecified chronic kidney disease: Secondary | ICD-10-CM | POA: Diagnosis not present

## 2020-08-19 DIAGNOSIS — I5030 Unspecified diastolic (congestive) heart failure: Secondary | ICD-10-CM | POA: Diagnosis not present

## 2020-08-19 DIAGNOSIS — N1832 Chronic kidney disease, stage 3b: Secondary | ICD-10-CM | POA: Diagnosis not present

## 2020-08-20 DIAGNOSIS — I69991 Dysphagia following unspecified cerebrovascular disease: Secondary | ICD-10-CM | POA: Diagnosis not present

## 2020-08-20 DIAGNOSIS — N1832 Chronic kidney disease, stage 3b: Secondary | ICD-10-CM | POA: Diagnosis not present

## 2020-08-20 DIAGNOSIS — I69354 Hemiplegia and hemiparesis following cerebral infarction affecting left non-dominant side: Secondary | ICD-10-CM | POA: Diagnosis not present

## 2020-08-20 DIAGNOSIS — I480 Paroxysmal atrial fibrillation: Secondary | ICD-10-CM | POA: Diagnosis not present

## 2020-08-20 DIAGNOSIS — I5030 Unspecified diastolic (congestive) heart failure: Secondary | ICD-10-CM | POA: Diagnosis not present

## 2020-08-20 DIAGNOSIS — I130 Hypertensive heart and chronic kidney disease with heart failure and stage 1 through stage 4 chronic kidney disease, or unspecified chronic kidney disease: Secondary | ICD-10-CM | POA: Diagnosis not present

## 2020-08-21 DIAGNOSIS — I480 Paroxysmal atrial fibrillation: Secondary | ICD-10-CM | POA: Diagnosis not present

## 2020-08-21 DIAGNOSIS — I69354 Hemiplegia and hemiparesis following cerebral infarction affecting left non-dominant side: Secondary | ICD-10-CM | POA: Diagnosis not present

## 2020-08-21 DIAGNOSIS — I5030 Unspecified diastolic (congestive) heart failure: Secondary | ICD-10-CM | POA: Diagnosis not present

## 2020-08-21 DIAGNOSIS — I69991 Dysphagia following unspecified cerebrovascular disease: Secondary | ICD-10-CM | POA: Diagnosis not present

## 2020-08-21 DIAGNOSIS — I130 Hypertensive heart and chronic kidney disease with heart failure and stage 1 through stage 4 chronic kidney disease, or unspecified chronic kidney disease: Secondary | ICD-10-CM | POA: Diagnosis not present

## 2020-08-21 DIAGNOSIS — N1832 Chronic kidney disease, stage 3b: Secondary | ICD-10-CM | POA: Diagnosis not present

## 2020-08-24 DIAGNOSIS — I69991 Dysphagia following unspecified cerebrovascular disease: Secondary | ICD-10-CM | POA: Diagnosis not present

## 2020-08-24 DIAGNOSIS — I480 Paroxysmal atrial fibrillation: Secondary | ICD-10-CM | POA: Diagnosis not present

## 2020-08-24 DIAGNOSIS — N1832 Chronic kidney disease, stage 3b: Secondary | ICD-10-CM | POA: Diagnosis not present

## 2020-08-24 DIAGNOSIS — I130 Hypertensive heart and chronic kidney disease with heart failure and stage 1 through stage 4 chronic kidney disease, or unspecified chronic kidney disease: Secondary | ICD-10-CM | POA: Diagnosis not present

## 2020-08-24 DIAGNOSIS — I69354 Hemiplegia and hemiparesis following cerebral infarction affecting left non-dominant side: Secondary | ICD-10-CM | POA: Diagnosis not present

## 2020-08-24 DIAGNOSIS — I5030 Unspecified diastolic (congestive) heart failure: Secondary | ICD-10-CM | POA: Diagnosis not present

## 2020-08-26 DIAGNOSIS — I69354 Hemiplegia and hemiparesis following cerebral infarction affecting left non-dominant side: Secondary | ICD-10-CM | POA: Diagnosis not present

## 2020-08-26 DIAGNOSIS — I130 Hypertensive heart and chronic kidney disease with heart failure and stage 1 through stage 4 chronic kidney disease, or unspecified chronic kidney disease: Secondary | ICD-10-CM | POA: Diagnosis not present

## 2020-08-26 DIAGNOSIS — I5030 Unspecified diastolic (congestive) heart failure: Secondary | ICD-10-CM | POA: Diagnosis not present

## 2020-08-26 DIAGNOSIS — I480 Paroxysmal atrial fibrillation: Secondary | ICD-10-CM | POA: Diagnosis not present

## 2020-08-26 DIAGNOSIS — I69991 Dysphagia following unspecified cerebrovascular disease: Secondary | ICD-10-CM | POA: Diagnosis not present

## 2020-08-26 DIAGNOSIS — N1832 Chronic kidney disease, stage 3b: Secondary | ICD-10-CM | POA: Diagnosis not present

## 2020-08-27 ENCOUNTER — Other Ambulatory Visit: Payer: Self-pay | Admitting: Cardiology

## 2020-08-27 DIAGNOSIS — R630 Anorexia: Secondary | ICD-10-CM

## 2020-08-28 DIAGNOSIS — I69991 Dysphagia following unspecified cerebrovascular disease: Secondary | ICD-10-CM | POA: Diagnosis not present

## 2020-08-28 DIAGNOSIS — N1832 Chronic kidney disease, stage 3b: Secondary | ICD-10-CM | POA: Diagnosis not present

## 2020-08-28 DIAGNOSIS — I130 Hypertensive heart and chronic kidney disease with heart failure and stage 1 through stage 4 chronic kidney disease, or unspecified chronic kidney disease: Secondary | ICD-10-CM | POA: Diagnosis not present

## 2020-08-28 DIAGNOSIS — I69354 Hemiplegia and hemiparesis following cerebral infarction affecting left non-dominant side: Secondary | ICD-10-CM | POA: Diagnosis not present

## 2020-08-28 DIAGNOSIS — I480 Paroxysmal atrial fibrillation: Secondary | ICD-10-CM | POA: Diagnosis not present

## 2020-08-28 DIAGNOSIS — I5030 Unspecified diastolic (congestive) heart failure: Secondary | ICD-10-CM | POA: Diagnosis not present

## 2020-08-29 DIAGNOSIS — I10 Essential (primary) hypertension: Secondary | ICD-10-CM | POA: Diagnosis not present

## 2020-08-29 DIAGNOSIS — I480 Paroxysmal atrial fibrillation: Secondary | ICD-10-CM | POA: Diagnosis not present

## 2020-08-29 DIAGNOSIS — N1832 Chronic kidney disease, stage 3b: Secondary | ICD-10-CM | POA: Diagnosis not present

## 2020-08-29 DIAGNOSIS — I69991 Dysphagia following unspecified cerebrovascular disease: Secondary | ICD-10-CM | POA: Diagnosis not present

## 2020-08-29 DIAGNOSIS — I130 Hypertensive heart and chronic kidney disease with heart failure and stage 1 through stage 4 chronic kidney disease, or unspecified chronic kidney disease: Secondary | ICD-10-CM | POA: Diagnosis not present

## 2020-08-29 DIAGNOSIS — I69354 Hemiplegia and hemiparesis following cerebral infarction affecting left non-dominant side: Secondary | ICD-10-CM | POA: Diagnosis not present

## 2020-08-29 DIAGNOSIS — I5030 Unspecified diastolic (congestive) heart failure: Secondary | ICD-10-CM | POA: Diagnosis not present

## 2020-09-02 DIAGNOSIS — Z9181 History of falling: Secondary | ICD-10-CM | POA: Diagnosis not present

## 2020-09-02 DIAGNOSIS — I2510 Atherosclerotic heart disease of native coronary artery without angina pectoris: Secondary | ICD-10-CM | POA: Diagnosis not present

## 2020-09-02 DIAGNOSIS — I480 Paroxysmal atrial fibrillation: Secondary | ICD-10-CM | POA: Diagnosis not present

## 2020-09-02 DIAGNOSIS — Z7901 Long term (current) use of anticoagulants: Secondary | ICD-10-CM | POA: Diagnosis not present

## 2020-09-02 DIAGNOSIS — I69991 Dysphagia following unspecified cerebrovascular disease: Secondary | ICD-10-CM | POA: Diagnosis not present

## 2020-09-02 DIAGNOSIS — I69354 Hemiplegia and hemiparesis following cerebral infarction affecting left non-dominant side: Secondary | ICD-10-CM | POA: Diagnosis not present

## 2020-09-02 DIAGNOSIS — Z743 Need for continuous supervision: Secondary | ICD-10-CM | POA: Diagnosis not present

## 2020-09-02 DIAGNOSIS — I340 Nonrheumatic mitral (valve) insufficiency: Secondary | ICD-10-CM | POA: Diagnosis not present

## 2020-09-02 DIAGNOSIS — J449 Chronic obstructive pulmonary disease, unspecified: Secondary | ICD-10-CM | POA: Diagnosis not present

## 2020-09-02 DIAGNOSIS — Z741 Need for assistance with personal care: Secondary | ICD-10-CM | POA: Diagnosis not present

## 2020-09-02 DIAGNOSIS — I130 Hypertensive heart and chronic kidney disease with heart failure and stage 1 through stage 4 chronic kidney disease, or unspecified chronic kidney disease: Secondary | ICD-10-CM | POA: Diagnosis not present

## 2020-09-02 DIAGNOSIS — L89626 Pressure-induced deep tissue damage of left heel: Secondary | ICD-10-CM | POA: Diagnosis not present

## 2020-09-02 DIAGNOSIS — I5030 Unspecified diastolic (congestive) heart failure: Secondary | ICD-10-CM | POA: Diagnosis not present

## 2020-09-02 DIAGNOSIS — N1832 Chronic kidney disease, stage 3b: Secondary | ICD-10-CM | POA: Diagnosis not present

## 2020-09-02 DIAGNOSIS — M150 Primary generalized (osteo)arthritis: Secondary | ICD-10-CM | POA: Diagnosis not present

## 2020-09-02 DIAGNOSIS — R1310 Dysphagia, unspecified: Secondary | ICD-10-CM | POA: Diagnosis not present

## 2020-09-02 DIAGNOSIS — Z79899 Other long term (current) drug therapy: Secondary | ICD-10-CM | POA: Diagnosis not present

## 2020-09-03 DIAGNOSIS — I4891 Unspecified atrial fibrillation: Secondary | ICD-10-CM | POA: Diagnosis not present

## 2020-09-03 DIAGNOSIS — Z8673 Personal history of transient ischemic attack (TIA), and cerebral infarction without residual deficits: Secondary | ICD-10-CM | POA: Diagnosis not present

## 2020-09-03 DIAGNOSIS — I10 Essential (primary) hypertension: Secondary | ICD-10-CM | POA: Diagnosis not present

## 2020-09-03 DIAGNOSIS — Z09 Encounter for follow-up examination after completed treatment for conditions other than malignant neoplasm: Secondary | ICD-10-CM | POA: Diagnosis not present

## 2020-09-03 DIAGNOSIS — R630 Anorexia: Secondary | ICD-10-CM | POA: Diagnosis not present

## 2020-09-03 DIAGNOSIS — K5909 Other constipation: Secondary | ICD-10-CM | POA: Diagnosis not present

## 2020-09-03 DIAGNOSIS — D649 Anemia, unspecified: Secondary | ICD-10-CM | POA: Diagnosis not present

## 2020-09-03 DIAGNOSIS — E876 Hypokalemia: Secondary | ICD-10-CM | POA: Diagnosis not present

## 2020-09-03 DIAGNOSIS — D539 Nutritional anemia, unspecified: Secondary | ICD-10-CM | POA: Diagnosis not present

## 2020-09-04 DIAGNOSIS — N1832 Chronic kidney disease, stage 3b: Secondary | ICD-10-CM | POA: Diagnosis not present

## 2020-09-04 DIAGNOSIS — I69354 Hemiplegia and hemiparesis following cerebral infarction affecting left non-dominant side: Secondary | ICD-10-CM | POA: Diagnosis not present

## 2020-09-04 DIAGNOSIS — I5030 Unspecified diastolic (congestive) heart failure: Secondary | ICD-10-CM | POA: Diagnosis not present

## 2020-09-04 DIAGNOSIS — I480 Paroxysmal atrial fibrillation: Secondary | ICD-10-CM | POA: Diagnosis not present

## 2020-09-04 DIAGNOSIS — I130 Hypertensive heart and chronic kidney disease with heart failure and stage 1 through stage 4 chronic kidney disease, or unspecified chronic kidney disease: Secondary | ICD-10-CM | POA: Diagnosis not present

## 2020-09-04 DIAGNOSIS — I69991 Dysphagia following unspecified cerebrovascular disease: Secondary | ICD-10-CM | POA: Diagnosis not present

## 2020-09-05 DIAGNOSIS — N1832 Chronic kidney disease, stage 3b: Secondary | ICD-10-CM | POA: Diagnosis not present

## 2020-09-05 DIAGNOSIS — I69354 Hemiplegia and hemiparesis following cerebral infarction affecting left non-dominant side: Secondary | ICD-10-CM | POA: Diagnosis not present

## 2020-09-05 DIAGNOSIS — I480 Paroxysmal atrial fibrillation: Secondary | ICD-10-CM | POA: Diagnosis not present

## 2020-09-05 DIAGNOSIS — I5030 Unspecified diastolic (congestive) heart failure: Secondary | ICD-10-CM | POA: Diagnosis not present

## 2020-09-05 DIAGNOSIS — I130 Hypertensive heart and chronic kidney disease with heart failure and stage 1 through stage 4 chronic kidney disease, or unspecified chronic kidney disease: Secondary | ICD-10-CM | POA: Diagnosis not present

## 2020-09-05 DIAGNOSIS — I69991 Dysphagia following unspecified cerebrovascular disease: Secondary | ICD-10-CM | POA: Diagnosis not present

## 2020-09-10 DIAGNOSIS — I480 Paroxysmal atrial fibrillation: Secondary | ICD-10-CM | POA: Diagnosis not present

## 2020-09-10 DIAGNOSIS — I69354 Hemiplegia and hemiparesis following cerebral infarction affecting left non-dominant side: Secondary | ICD-10-CM | POA: Diagnosis not present

## 2020-09-10 DIAGNOSIS — I130 Hypertensive heart and chronic kidney disease with heart failure and stage 1 through stage 4 chronic kidney disease, or unspecified chronic kidney disease: Secondary | ICD-10-CM | POA: Diagnosis not present

## 2020-09-10 DIAGNOSIS — I69991 Dysphagia following unspecified cerebrovascular disease: Secondary | ICD-10-CM | POA: Diagnosis not present

## 2020-09-10 DIAGNOSIS — I5030 Unspecified diastolic (congestive) heart failure: Secondary | ICD-10-CM | POA: Diagnosis not present

## 2020-09-10 DIAGNOSIS — N1832 Chronic kidney disease, stage 3b: Secondary | ICD-10-CM | POA: Diagnosis not present

## 2020-09-11 DIAGNOSIS — K5900 Constipation, unspecified: Secondary | ICD-10-CM | POA: Diagnosis not present

## 2020-09-11 DIAGNOSIS — R630 Anorexia: Secondary | ICD-10-CM | POA: Diagnosis not present

## 2020-09-11 DIAGNOSIS — Z66 Do not resuscitate: Secondary | ICD-10-CM | POA: Diagnosis not present

## 2020-09-11 DIAGNOSIS — E876 Hypokalemia: Secondary | ICD-10-CM | POA: Diagnosis not present

## 2020-09-11 DIAGNOSIS — D649 Anemia, unspecified: Secondary | ICD-10-CM | POA: Diagnosis not present

## 2020-09-13 DIAGNOSIS — I480 Paroxysmal atrial fibrillation: Secondary | ICD-10-CM | POA: Diagnosis not present

## 2020-09-13 DIAGNOSIS — I130 Hypertensive heart and chronic kidney disease with heart failure and stage 1 through stage 4 chronic kidney disease, or unspecified chronic kidney disease: Secondary | ICD-10-CM | POA: Diagnosis not present

## 2020-09-13 DIAGNOSIS — I69354 Hemiplegia and hemiparesis following cerebral infarction affecting left non-dominant side: Secondary | ICD-10-CM | POA: Diagnosis not present

## 2020-09-13 DIAGNOSIS — I5030 Unspecified diastolic (congestive) heart failure: Secondary | ICD-10-CM | POA: Diagnosis not present

## 2020-09-13 DIAGNOSIS — I69991 Dysphagia following unspecified cerebrovascular disease: Secondary | ICD-10-CM | POA: Diagnosis not present

## 2020-09-13 DIAGNOSIS — N1832 Chronic kidney disease, stage 3b: Secondary | ICD-10-CM | POA: Diagnosis not present

## 2020-09-14 DIAGNOSIS — N1832 Chronic kidney disease, stage 3b: Secondary | ICD-10-CM | POA: Diagnosis not present

## 2020-09-14 DIAGNOSIS — I480 Paroxysmal atrial fibrillation: Secondary | ICD-10-CM | POA: Diagnosis not present

## 2020-09-14 DIAGNOSIS — I69991 Dysphagia following unspecified cerebrovascular disease: Secondary | ICD-10-CM | POA: Diagnosis not present

## 2020-09-14 DIAGNOSIS — I69354 Hemiplegia and hemiparesis following cerebral infarction affecting left non-dominant side: Secondary | ICD-10-CM | POA: Diagnosis not present

## 2020-09-14 DIAGNOSIS — I5030 Unspecified diastolic (congestive) heart failure: Secondary | ICD-10-CM | POA: Diagnosis not present

## 2020-09-14 DIAGNOSIS — I130 Hypertensive heart and chronic kidney disease with heart failure and stage 1 through stage 4 chronic kidney disease, or unspecified chronic kidney disease: Secondary | ICD-10-CM | POA: Diagnosis not present

## 2020-09-16 DIAGNOSIS — I130 Hypertensive heart and chronic kidney disease with heart failure and stage 1 through stage 4 chronic kidney disease, or unspecified chronic kidney disease: Secondary | ICD-10-CM | POA: Diagnosis not present

## 2020-09-16 DIAGNOSIS — I480 Paroxysmal atrial fibrillation: Secondary | ICD-10-CM | POA: Diagnosis not present

## 2020-09-16 DIAGNOSIS — N1832 Chronic kidney disease, stage 3b: Secondary | ICD-10-CM | POA: Diagnosis not present

## 2020-09-16 DIAGNOSIS — I69354 Hemiplegia and hemiparesis following cerebral infarction affecting left non-dominant side: Secondary | ICD-10-CM | POA: Diagnosis not present

## 2020-09-16 DIAGNOSIS — I69991 Dysphagia following unspecified cerebrovascular disease: Secondary | ICD-10-CM | POA: Diagnosis not present

## 2020-09-16 DIAGNOSIS — I5030 Unspecified diastolic (congestive) heart failure: Secondary | ICD-10-CM | POA: Diagnosis not present

## 2020-09-17 DIAGNOSIS — I130 Hypertensive heart and chronic kidney disease with heart failure and stage 1 through stage 4 chronic kidney disease, or unspecified chronic kidney disease: Secondary | ICD-10-CM | POA: Diagnosis not present

## 2020-09-17 DIAGNOSIS — I69354 Hemiplegia and hemiparesis following cerebral infarction affecting left non-dominant side: Secondary | ICD-10-CM | POA: Diagnosis not present

## 2020-09-17 DIAGNOSIS — I480 Paroxysmal atrial fibrillation: Secondary | ICD-10-CM | POA: Diagnosis not present

## 2020-09-17 DIAGNOSIS — N1832 Chronic kidney disease, stage 3b: Secondary | ICD-10-CM | POA: Diagnosis not present

## 2020-09-17 DIAGNOSIS — I5030 Unspecified diastolic (congestive) heart failure: Secondary | ICD-10-CM | POA: Diagnosis not present

## 2020-09-17 DIAGNOSIS — I69991 Dysphagia following unspecified cerebrovascular disease: Secondary | ICD-10-CM | POA: Diagnosis not present

## 2020-09-18 DIAGNOSIS — I5030 Unspecified diastolic (congestive) heart failure: Secondary | ICD-10-CM | POA: Diagnosis not present

## 2020-09-18 DIAGNOSIS — I130 Hypertensive heart and chronic kidney disease with heart failure and stage 1 through stage 4 chronic kidney disease, or unspecified chronic kidney disease: Secondary | ICD-10-CM | POA: Diagnosis not present

## 2020-09-18 DIAGNOSIS — N1832 Chronic kidney disease, stage 3b: Secondary | ICD-10-CM | POA: Diagnosis not present

## 2020-09-18 DIAGNOSIS — I480 Paroxysmal atrial fibrillation: Secondary | ICD-10-CM | POA: Diagnosis not present

## 2020-09-18 DIAGNOSIS — I69991 Dysphagia following unspecified cerebrovascular disease: Secondary | ICD-10-CM | POA: Diagnosis not present

## 2020-09-18 DIAGNOSIS — I69354 Hemiplegia and hemiparesis following cerebral infarction affecting left non-dominant side: Secondary | ICD-10-CM | POA: Diagnosis not present

## 2020-09-19 DIAGNOSIS — I69354 Hemiplegia and hemiparesis following cerebral infarction affecting left non-dominant side: Secondary | ICD-10-CM | POA: Diagnosis not present

## 2020-09-19 DIAGNOSIS — I69991 Dysphagia following unspecified cerebrovascular disease: Secondary | ICD-10-CM | POA: Diagnosis not present

## 2020-09-19 DIAGNOSIS — N1832 Chronic kidney disease, stage 3b: Secondary | ICD-10-CM | POA: Diagnosis not present

## 2020-09-19 DIAGNOSIS — I5030 Unspecified diastolic (congestive) heart failure: Secondary | ICD-10-CM | POA: Diagnosis not present

## 2020-09-19 DIAGNOSIS — I130 Hypertensive heart and chronic kidney disease with heart failure and stage 1 through stage 4 chronic kidney disease, or unspecified chronic kidney disease: Secondary | ICD-10-CM | POA: Diagnosis not present

## 2020-09-19 DIAGNOSIS — I480 Paroxysmal atrial fibrillation: Secondary | ICD-10-CM | POA: Diagnosis not present

## 2020-09-20 DIAGNOSIS — I69354 Hemiplegia and hemiparesis following cerebral infarction affecting left non-dominant side: Secondary | ICD-10-CM | POA: Diagnosis not present

## 2020-09-20 DIAGNOSIS — N1832 Chronic kidney disease, stage 3b: Secondary | ICD-10-CM | POA: Diagnosis not present

## 2020-09-20 DIAGNOSIS — I5030 Unspecified diastolic (congestive) heart failure: Secondary | ICD-10-CM | POA: Diagnosis not present

## 2020-09-20 DIAGNOSIS — I480 Paroxysmal atrial fibrillation: Secondary | ICD-10-CM | POA: Diagnosis not present

## 2020-09-20 DIAGNOSIS — I69991 Dysphagia following unspecified cerebrovascular disease: Secondary | ICD-10-CM | POA: Diagnosis not present

## 2020-09-20 DIAGNOSIS — I130 Hypertensive heart and chronic kidney disease with heart failure and stage 1 through stage 4 chronic kidney disease, or unspecified chronic kidney disease: Secondary | ICD-10-CM | POA: Diagnosis not present

## 2020-09-23 ENCOUNTER — Other Ambulatory Visit: Payer: Self-pay | Admitting: Adult Health

## 2020-09-23 DIAGNOSIS — I130 Hypertensive heart and chronic kidney disease with heart failure and stage 1 through stage 4 chronic kidney disease, or unspecified chronic kidney disease: Secondary | ICD-10-CM | POA: Diagnosis not present

## 2020-09-23 DIAGNOSIS — N1832 Chronic kidney disease, stage 3b: Secondary | ICD-10-CM | POA: Diagnosis not present

## 2020-09-23 DIAGNOSIS — I5030 Unspecified diastolic (congestive) heart failure: Secondary | ICD-10-CM | POA: Diagnosis not present

## 2020-09-23 DIAGNOSIS — I480 Paroxysmal atrial fibrillation: Secondary | ICD-10-CM

## 2020-09-23 DIAGNOSIS — I639 Cerebral infarction, unspecified: Secondary | ICD-10-CM

## 2020-09-23 DIAGNOSIS — I69354 Hemiplegia and hemiparesis following cerebral infarction affecting left non-dominant side: Secondary | ICD-10-CM | POA: Diagnosis not present

## 2020-09-23 DIAGNOSIS — I69991 Dysphagia following unspecified cerebrovascular disease: Secondary | ICD-10-CM | POA: Diagnosis not present

## 2020-09-24 ENCOUNTER — Other Ambulatory Visit: Payer: Self-pay | Admitting: Cardiology

## 2020-09-24 DIAGNOSIS — N1832 Chronic kidney disease, stage 3b: Secondary | ICD-10-CM | POA: Diagnosis not present

## 2020-09-24 DIAGNOSIS — I480 Paroxysmal atrial fibrillation: Secondary | ICD-10-CM | POA: Diagnosis not present

## 2020-09-24 DIAGNOSIS — I5030 Unspecified diastolic (congestive) heart failure: Secondary | ICD-10-CM | POA: Diagnosis not present

## 2020-09-24 DIAGNOSIS — I639 Cerebral infarction, unspecified: Secondary | ICD-10-CM

## 2020-09-24 DIAGNOSIS — I69354 Hemiplegia and hemiparesis following cerebral infarction affecting left non-dominant side: Secondary | ICD-10-CM | POA: Diagnosis not present

## 2020-09-24 DIAGNOSIS — I130 Hypertensive heart and chronic kidney disease with heart failure and stage 1 through stage 4 chronic kidney disease, or unspecified chronic kidney disease: Secondary | ICD-10-CM | POA: Diagnosis not present

## 2020-09-24 DIAGNOSIS — I69991 Dysphagia following unspecified cerebrovascular disease: Secondary | ICD-10-CM | POA: Diagnosis not present

## 2020-09-26 DIAGNOSIS — I480 Paroxysmal atrial fibrillation: Secondary | ICD-10-CM | POA: Diagnosis not present

## 2020-09-26 DIAGNOSIS — I5030 Unspecified diastolic (congestive) heart failure: Secondary | ICD-10-CM | POA: Diagnosis not present

## 2020-09-26 DIAGNOSIS — I69991 Dysphagia following unspecified cerebrovascular disease: Secondary | ICD-10-CM | POA: Diagnosis not present

## 2020-09-26 DIAGNOSIS — I130 Hypertensive heart and chronic kidney disease with heart failure and stage 1 through stage 4 chronic kidney disease, or unspecified chronic kidney disease: Secondary | ICD-10-CM | POA: Diagnosis not present

## 2020-09-26 DIAGNOSIS — N1832 Chronic kidney disease, stage 3b: Secondary | ICD-10-CM | POA: Diagnosis not present

## 2020-09-26 DIAGNOSIS — I69354 Hemiplegia and hemiparesis following cerebral infarction affecting left non-dominant side: Secondary | ICD-10-CM | POA: Diagnosis not present

## 2020-09-27 DIAGNOSIS — I69354 Hemiplegia and hemiparesis following cerebral infarction affecting left non-dominant side: Secondary | ICD-10-CM | POA: Diagnosis not present

## 2020-09-27 DIAGNOSIS — N1832 Chronic kidney disease, stage 3b: Secondary | ICD-10-CM | POA: Diagnosis not present

## 2020-09-27 DIAGNOSIS — I480 Paroxysmal atrial fibrillation: Secondary | ICD-10-CM | POA: Diagnosis not present

## 2020-09-27 DIAGNOSIS — I130 Hypertensive heart and chronic kidney disease with heart failure and stage 1 through stage 4 chronic kidney disease, or unspecified chronic kidney disease: Secondary | ICD-10-CM | POA: Diagnosis not present

## 2020-09-27 DIAGNOSIS — I69991 Dysphagia following unspecified cerebrovascular disease: Secondary | ICD-10-CM | POA: Diagnosis not present

## 2020-09-27 DIAGNOSIS — I5030 Unspecified diastolic (congestive) heart failure: Secondary | ICD-10-CM | POA: Diagnosis not present

## 2020-09-29 DIAGNOSIS — I10 Essential (primary) hypertension: Secondary | ICD-10-CM | POA: Diagnosis not present

## 2020-10-01 DIAGNOSIS — N1832 Chronic kidney disease, stage 3b: Secondary | ICD-10-CM | POA: Diagnosis not present

## 2020-10-01 DIAGNOSIS — I69991 Dysphagia following unspecified cerebrovascular disease: Secondary | ICD-10-CM | POA: Diagnosis not present

## 2020-10-01 DIAGNOSIS — I130 Hypertensive heart and chronic kidney disease with heart failure and stage 1 through stage 4 chronic kidney disease, or unspecified chronic kidney disease: Secondary | ICD-10-CM | POA: Diagnosis not present

## 2020-10-01 DIAGNOSIS — I480 Paroxysmal atrial fibrillation: Secondary | ICD-10-CM | POA: Diagnosis not present

## 2020-10-01 DIAGNOSIS — I69354 Hemiplegia and hemiparesis following cerebral infarction affecting left non-dominant side: Secondary | ICD-10-CM | POA: Diagnosis not present

## 2020-10-01 DIAGNOSIS — I5030 Unspecified diastolic (congestive) heart failure: Secondary | ICD-10-CM | POA: Diagnosis not present

## 2020-10-02 DIAGNOSIS — N1832 Chronic kidney disease, stage 3b: Secondary | ICD-10-CM | POA: Diagnosis not present

## 2020-10-02 DIAGNOSIS — L89626 Pressure-induced deep tissue damage of left heel: Secondary | ICD-10-CM | POA: Diagnosis not present

## 2020-10-02 DIAGNOSIS — Z7901 Long term (current) use of anticoagulants: Secondary | ICD-10-CM | POA: Diagnosis not present

## 2020-10-02 DIAGNOSIS — I130 Hypertensive heart and chronic kidney disease with heart failure and stage 1 through stage 4 chronic kidney disease, or unspecified chronic kidney disease: Secondary | ICD-10-CM | POA: Diagnosis not present

## 2020-10-02 DIAGNOSIS — M150 Primary generalized (osteo)arthritis: Secondary | ICD-10-CM | POA: Diagnosis not present

## 2020-10-02 DIAGNOSIS — Z9181 History of falling: Secondary | ICD-10-CM | POA: Diagnosis not present

## 2020-10-02 DIAGNOSIS — J449 Chronic obstructive pulmonary disease, unspecified: Secondary | ICD-10-CM | POA: Diagnosis not present

## 2020-10-02 DIAGNOSIS — I69991 Dysphagia following unspecified cerebrovascular disease: Secondary | ICD-10-CM | POA: Diagnosis not present

## 2020-10-02 DIAGNOSIS — I69354 Hemiplegia and hemiparesis following cerebral infarction affecting left non-dominant side: Secondary | ICD-10-CM | POA: Diagnosis not present

## 2020-10-02 DIAGNOSIS — I5030 Unspecified diastolic (congestive) heart failure: Secondary | ICD-10-CM | POA: Diagnosis not present

## 2020-10-02 DIAGNOSIS — Z741 Need for assistance with personal care: Secondary | ICD-10-CM | POA: Diagnosis not present

## 2020-10-02 DIAGNOSIS — I2510 Atherosclerotic heart disease of native coronary artery without angina pectoris: Secondary | ICD-10-CM | POA: Diagnosis not present

## 2020-10-02 DIAGNOSIS — I480 Paroxysmal atrial fibrillation: Secondary | ICD-10-CM | POA: Diagnosis not present

## 2020-10-02 DIAGNOSIS — I340 Nonrheumatic mitral (valve) insufficiency: Secondary | ICD-10-CM | POA: Diagnosis not present

## 2020-10-02 DIAGNOSIS — Z79899 Other long term (current) drug therapy: Secondary | ICD-10-CM | POA: Diagnosis not present

## 2020-10-02 DIAGNOSIS — R1310 Dysphagia, unspecified: Secondary | ICD-10-CM | POA: Diagnosis not present

## 2020-10-02 DIAGNOSIS — Z743 Need for continuous supervision: Secondary | ICD-10-CM | POA: Diagnosis not present

## 2020-10-03 DIAGNOSIS — I69354 Hemiplegia and hemiparesis following cerebral infarction affecting left non-dominant side: Secondary | ICD-10-CM | POA: Diagnosis not present

## 2020-10-03 DIAGNOSIS — I69991 Dysphagia following unspecified cerebrovascular disease: Secondary | ICD-10-CM | POA: Diagnosis not present

## 2020-10-03 DIAGNOSIS — I5030 Unspecified diastolic (congestive) heart failure: Secondary | ICD-10-CM | POA: Diagnosis not present

## 2020-10-03 DIAGNOSIS — I480 Paroxysmal atrial fibrillation: Secondary | ICD-10-CM | POA: Diagnosis not present

## 2020-10-03 DIAGNOSIS — N1832 Chronic kidney disease, stage 3b: Secondary | ICD-10-CM | POA: Diagnosis not present

## 2020-10-03 DIAGNOSIS — I130 Hypertensive heart and chronic kidney disease with heart failure and stage 1 through stage 4 chronic kidney disease, or unspecified chronic kidney disease: Secondary | ICD-10-CM | POA: Diagnosis not present

## 2020-10-07 DIAGNOSIS — I5030 Unspecified diastolic (congestive) heart failure: Secondary | ICD-10-CM | POA: Diagnosis not present

## 2020-10-07 DIAGNOSIS — N1832 Chronic kidney disease, stage 3b: Secondary | ICD-10-CM | POA: Diagnosis not present

## 2020-10-07 DIAGNOSIS — I69991 Dysphagia following unspecified cerebrovascular disease: Secondary | ICD-10-CM | POA: Diagnosis not present

## 2020-10-07 DIAGNOSIS — I480 Paroxysmal atrial fibrillation: Secondary | ICD-10-CM | POA: Diagnosis not present

## 2020-10-07 DIAGNOSIS — I69354 Hemiplegia and hemiparesis following cerebral infarction affecting left non-dominant side: Secondary | ICD-10-CM | POA: Diagnosis not present

## 2020-10-07 DIAGNOSIS — I130 Hypertensive heart and chronic kidney disease with heart failure and stage 1 through stage 4 chronic kidney disease, or unspecified chronic kidney disease: Secondary | ICD-10-CM | POA: Diagnosis not present

## 2020-10-09 DIAGNOSIS — I5030 Unspecified diastolic (congestive) heart failure: Secondary | ICD-10-CM | POA: Diagnosis not present

## 2020-10-09 DIAGNOSIS — I69991 Dysphagia following unspecified cerebrovascular disease: Secondary | ICD-10-CM | POA: Diagnosis not present

## 2020-10-09 DIAGNOSIS — I480 Paroxysmal atrial fibrillation: Secondary | ICD-10-CM | POA: Diagnosis not present

## 2020-10-09 DIAGNOSIS — I130 Hypertensive heart and chronic kidney disease with heart failure and stage 1 through stage 4 chronic kidney disease, or unspecified chronic kidney disease: Secondary | ICD-10-CM | POA: Diagnosis not present

## 2020-10-09 DIAGNOSIS — I69354 Hemiplegia and hemiparesis following cerebral infarction affecting left non-dominant side: Secondary | ICD-10-CM | POA: Diagnosis not present

## 2020-10-09 DIAGNOSIS — N1832 Chronic kidney disease, stage 3b: Secondary | ICD-10-CM | POA: Diagnosis not present

## 2020-10-11 DIAGNOSIS — K5900 Constipation, unspecified: Secondary | ICD-10-CM | POA: Diagnosis not present

## 2020-10-11 DIAGNOSIS — D649 Anemia, unspecified: Secondary | ICD-10-CM | POA: Diagnosis not present

## 2020-10-14 DIAGNOSIS — I5030 Unspecified diastolic (congestive) heart failure: Secondary | ICD-10-CM | POA: Diagnosis not present

## 2020-10-14 DIAGNOSIS — I69991 Dysphagia following unspecified cerebrovascular disease: Secondary | ICD-10-CM | POA: Diagnosis not present

## 2020-10-14 DIAGNOSIS — I480 Paroxysmal atrial fibrillation: Secondary | ICD-10-CM | POA: Diagnosis not present

## 2020-10-14 DIAGNOSIS — N1832 Chronic kidney disease, stage 3b: Secondary | ICD-10-CM | POA: Diagnosis not present

## 2020-10-14 DIAGNOSIS — I130 Hypertensive heart and chronic kidney disease with heart failure and stage 1 through stage 4 chronic kidney disease, or unspecified chronic kidney disease: Secondary | ICD-10-CM | POA: Diagnosis not present

## 2020-10-14 DIAGNOSIS — I69354 Hemiplegia and hemiparesis following cerebral infarction affecting left non-dominant side: Secondary | ICD-10-CM | POA: Diagnosis not present

## 2020-10-20 DIAGNOSIS — I69354 Hemiplegia and hemiparesis following cerebral infarction affecting left non-dominant side: Secondary | ICD-10-CM | POA: Diagnosis not present

## 2020-10-20 DIAGNOSIS — I5030 Unspecified diastolic (congestive) heart failure: Secondary | ICD-10-CM | POA: Diagnosis not present

## 2020-10-20 DIAGNOSIS — I480 Paroxysmal atrial fibrillation: Secondary | ICD-10-CM | POA: Diagnosis not present

## 2020-10-20 DIAGNOSIS — I130 Hypertensive heart and chronic kidney disease with heart failure and stage 1 through stage 4 chronic kidney disease, or unspecified chronic kidney disease: Secondary | ICD-10-CM | POA: Diagnosis not present

## 2020-10-20 DIAGNOSIS — N1832 Chronic kidney disease, stage 3b: Secondary | ICD-10-CM | POA: Diagnosis not present

## 2020-10-20 DIAGNOSIS — I69991 Dysphagia following unspecified cerebrovascular disease: Secondary | ICD-10-CM | POA: Diagnosis not present

## 2020-10-28 DIAGNOSIS — N1832 Chronic kidney disease, stage 3b: Secondary | ICD-10-CM | POA: Diagnosis not present

## 2020-10-28 DIAGNOSIS — I480 Paroxysmal atrial fibrillation: Secondary | ICD-10-CM | POA: Diagnosis not present

## 2020-10-28 DIAGNOSIS — I5030 Unspecified diastolic (congestive) heart failure: Secondary | ICD-10-CM | POA: Diagnosis not present

## 2020-10-28 DIAGNOSIS — I69354 Hemiplegia and hemiparesis following cerebral infarction affecting left non-dominant side: Secondary | ICD-10-CM | POA: Diagnosis not present

## 2020-10-28 DIAGNOSIS — I130 Hypertensive heart and chronic kidney disease with heart failure and stage 1 through stage 4 chronic kidney disease, or unspecified chronic kidney disease: Secondary | ICD-10-CM | POA: Diagnosis not present

## 2020-10-28 DIAGNOSIS — I69991 Dysphagia following unspecified cerebrovascular disease: Secondary | ICD-10-CM | POA: Diagnosis not present

## 2020-10-31 ENCOUNTER — Other Ambulatory Visit: Payer: Self-pay | Admitting: Adult Health

## 2020-10-31 DIAGNOSIS — I639 Cerebral infarction, unspecified: Secondary | ICD-10-CM

## 2020-11-01 DIAGNOSIS — I69991 Dysphagia following unspecified cerebrovascular disease: Secondary | ICD-10-CM | POA: Diagnosis not present

## 2020-11-01 DIAGNOSIS — Z741 Need for assistance with personal care: Secondary | ICD-10-CM | POA: Diagnosis not present

## 2020-11-01 DIAGNOSIS — N1832 Chronic kidney disease, stage 3b: Secondary | ICD-10-CM | POA: Diagnosis not present

## 2020-11-01 DIAGNOSIS — I480 Paroxysmal atrial fibrillation: Secondary | ICD-10-CM | POA: Diagnosis not present

## 2020-11-01 DIAGNOSIS — Z743 Need for continuous supervision: Secondary | ICD-10-CM | POA: Diagnosis not present

## 2020-11-01 DIAGNOSIS — Z9181 History of falling: Secondary | ICD-10-CM | POA: Diagnosis not present

## 2020-11-01 DIAGNOSIS — M150 Primary generalized (osteo)arthritis: Secondary | ICD-10-CM | POA: Diagnosis not present

## 2020-11-01 DIAGNOSIS — I5030 Unspecified diastolic (congestive) heart failure: Secondary | ICD-10-CM | POA: Diagnosis not present

## 2020-11-01 DIAGNOSIS — I69354 Hemiplegia and hemiparesis following cerebral infarction affecting left non-dominant side: Secondary | ICD-10-CM | POA: Diagnosis not present

## 2020-11-01 DIAGNOSIS — Z7901 Long term (current) use of anticoagulants: Secondary | ICD-10-CM | POA: Diagnosis not present

## 2020-11-01 DIAGNOSIS — Z79899 Other long term (current) drug therapy: Secondary | ICD-10-CM | POA: Diagnosis not present

## 2020-11-01 DIAGNOSIS — I340 Nonrheumatic mitral (valve) insufficiency: Secondary | ICD-10-CM | POA: Diagnosis not present

## 2020-11-01 DIAGNOSIS — L89626 Pressure-induced deep tissue damage of left heel: Secondary | ICD-10-CM | POA: Diagnosis not present

## 2020-11-01 DIAGNOSIS — R1310 Dysphagia, unspecified: Secondary | ICD-10-CM | POA: Diagnosis not present

## 2020-11-01 DIAGNOSIS — J449 Chronic obstructive pulmonary disease, unspecified: Secondary | ICD-10-CM | POA: Diagnosis not present

## 2020-11-01 DIAGNOSIS — I2510 Atherosclerotic heart disease of native coronary artery without angina pectoris: Secondary | ICD-10-CM | POA: Diagnosis not present

## 2020-11-01 DIAGNOSIS — I130 Hypertensive heart and chronic kidney disease with heart failure and stage 1 through stage 4 chronic kidney disease, or unspecified chronic kidney disease: Secondary | ICD-10-CM | POA: Diagnosis not present

## 2020-11-04 DIAGNOSIS — I5030 Unspecified diastolic (congestive) heart failure: Secondary | ICD-10-CM | POA: Diagnosis not present

## 2020-11-04 DIAGNOSIS — I480 Paroxysmal atrial fibrillation: Secondary | ICD-10-CM | POA: Diagnosis not present

## 2020-11-04 DIAGNOSIS — I69991 Dysphagia following unspecified cerebrovascular disease: Secondary | ICD-10-CM | POA: Diagnosis not present

## 2020-11-04 DIAGNOSIS — I69354 Hemiplegia and hemiparesis following cerebral infarction affecting left non-dominant side: Secondary | ICD-10-CM | POA: Diagnosis not present

## 2020-11-04 DIAGNOSIS — I130 Hypertensive heart and chronic kidney disease with heart failure and stage 1 through stage 4 chronic kidney disease, or unspecified chronic kidney disease: Secondary | ICD-10-CM | POA: Diagnosis not present

## 2020-11-04 DIAGNOSIS — N1832 Chronic kidney disease, stage 3b: Secondary | ICD-10-CM | POA: Diagnosis not present

## 2020-11-08 DIAGNOSIS — I69991 Dysphagia following unspecified cerebrovascular disease: Secondary | ICD-10-CM | POA: Diagnosis not present

## 2020-11-08 DIAGNOSIS — I5030 Unspecified diastolic (congestive) heart failure: Secondary | ICD-10-CM | POA: Diagnosis not present

## 2020-11-08 DIAGNOSIS — I130 Hypertensive heart and chronic kidney disease with heart failure and stage 1 through stage 4 chronic kidney disease, or unspecified chronic kidney disease: Secondary | ICD-10-CM | POA: Diagnosis not present

## 2020-11-08 DIAGNOSIS — I480 Paroxysmal atrial fibrillation: Secondary | ICD-10-CM | POA: Diagnosis not present

## 2020-11-08 DIAGNOSIS — I69354 Hemiplegia and hemiparesis following cerebral infarction affecting left non-dominant side: Secondary | ICD-10-CM | POA: Diagnosis not present

## 2020-11-08 DIAGNOSIS — N1832 Chronic kidney disease, stage 3b: Secondary | ICD-10-CM | POA: Diagnosis not present

## 2020-11-13 DIAGNOSIS — I130 Hypertensive heart and chronic kidney disease with heart failure and stage 1 through stage 4 chronic kidney disease, or unspecified chronic kidney disease: Secondary | ICD-10-CM | POA: Diagnosis not present

## 2020-11-13 DIAGNOSIS — N1832 Chronic kidney disease, stage 3b: Secondary | ICD-10-CM | POA: Diagnosis not present

## 2020-11-13 DIAGNOSIS — I69991 Dysphagia following unspecified cerebrovascular disease: Secondary | ICD-10-CM | POA: Diagnosis not present

## 2020-11-13 DIAGNOSIS — I480 Paroxysmal atrial fibrillation: Secondary | ICD-10-CM | POA: Diagnosis not present

## 2020-11-13 DIAGNOSIS — I69354 Hemiplegia and hemiparesis following cerebral infarction affecting left non-dominant side: Secondary | ICD-10-CM | POA: Diagnosis not present

## 2020-11-13 DIAGNOSIS — I5030 Unspecified diastolic (congestive) heart failure: Secondary | ICD-10-CM | POA: Diagnosis not present

## 2020-11-14 DIAGNOSIS — I5030 Unspecified diastolic (congestive) heart failure: Secondary | ICD-10-CM | POA: Diagnosis not present

## 2020-11-14 DIAGNOSIS — I69354 Hemiplegia and hemiparesis following cerebral infarction affecting left non-dominant side: Secondary | ICD-10-CM | POA: Diagnosis not present

## 2020-11-14 DIAGNOSIS — I69991 Dysphagia following unspecified cerebrovascular disease: Secondary | ICD-10-CM | POA: Diagnosis not present

## 2020-11-14 DIAGNOSIS — I130 Hypertensive heart and chronic kidney disease with heart failure and stage 1 through stage 4 chronic kidney disease, or unspecified chronic kidney disease: Secondary | ICD-10-CM | POA: Diagnosis not present

## 2020-11-14 DIAGNOSIS — I480 Paroxysmal atrial fibrillation: Secondary | ICD-10-CM | POA: Diagnosis not present

## 2020-11-14 DIAGNOSIS — N1832 Chronic kidney disease, stage 3b: Secondary | ICD-10-CM | POA: Diagnosis not present

## 2020-11-18 DIAGNOSIS — I5030 Unspecified diastolic (congestive) heart failure: Secondary | ICD-10-CM | POA: Diagnosis not present

## 2020-11-18 DIAGNOSIS — I130 Hypertensive heart and chronic kidney disease with heart failure and stage 1 through stage 4 chronic kidney disease, or unspecified chronic kidney disease: Secondary | ICD-10-CM | POA: Diagnosis not present

## 2020-11-18 DIAGNOSIS — I69991 Dysphagia following unspecified cerebrovascular disease: Secondary | ICD-10-CM | POA: Diagnosis not present

## 2020-11-18 DIAGNOSIS — N1832 Chronic kidney disease, stage 3b: Secondary | ICD-10-CM | POA: Diagnosis not present

## 2020-11-18 DIAGNOSIS — I69354 Hemiplegia and hemiparesis following cerebral infarction affecting left non-dominant side: Secondary | ICD-10-CM | POA: Diagnosis not present

## 2020-11-18 DIAGNOSIS — I480 Paroxysmal atrial fibrillation: Secondary | ICD-10-CM | POA: Diagnosis not present

## 2020-11-21 DIAGNOSIS — I69354 Hemiplegia and hemiparesis following cerebral infarction affecting left non-dominant side: Secondary | ICD-10-CM | POA: Diagnosis not present

## 2020-11-21 DIAGNOSIS — I130 Hypertensive heart and chronic kidney disease with heart failure and stage 1 through stage 4 chronic kidney disease, or unspecified chronic kidney disease: Secondary | ICD-10-CM | POA: Diagnosis not present

## 2020-11-21 DIAGNOSIS — N1832 Chronic kidney disease, stage 3b: Secondary | ICD-10-CM | POA: Diagnosis not present

## 2020-11-21 DIAGNOSIS — I5030 Unspecified diastolic (congestive) heart failure: Secondary | ICD-10-CM | POA: Diagnosis not present

## 2020-11-26 DIAGNOSIS — I69354 Hemiplegia and hemiparesis following cerebral infarction affecting left non-dominant side: Secondary | ICD-10-CM | POA: Diagnosis not present

## 2020-11-26 DIAGNOSIS — I480 Paroxysmal atrial fibrillation: Secondary | ICD-10-CM | POA: Diagnosis not present

## 2020-11-26 DIAGNOSIS — I69991 Dysphagia following unspecified cerebrovascular disease: Secondary | ICD-10-CM | POA: Diagnosis not present

## 2020-11-26 DIAGNOSIS — I130 Hypertensive heart and chronic kidney disease with heart failure and stage 1 through stage 4 chronic kidney disease, or unspecified chronic kidney disease: Secondary | ICD-10-CM | POA: Diagnosis not present

## 2020-11-26 DIAGNOSIS — N1832 Chronic kidney disease, stage 3b: Secondary | ICD-10-CM | POA: Diagnosis not present

## 2020-11-26 DIAGNOSIS — I5030 Unspecified diastolic (congestive) heart failure: Secondary | ICD-10-CM | POA: Diagnosis not present

## 2020-11-29 ENCOUNTER — Other Ambulatory Visit: Payer: Self-pay | Admitting: Adult Health

## 2020-11-29 DIAGNOSIS — I639 Cerebral infarction, unspecified: Secondary | ICD-10-CM

## 2020-12-01 DIAGNOSIS — L89626 Pressure-induced deep tissue damage of left heel: Secondary | ICD-10-CM | POA: Diagnosis not present

## 2020-12-01 DIAGNOSIS — I69354 Hemiplegia and hemiparesis following cerebral infarction affecting left non-dominant side: Secondary | ICD-10-CM | POA: Diagnosis not present

## 2020-12-01 DIAGNOSIS — Z79899 Other long term (current) drug therapy: Secondary | ICD-10-CM | POA: Diagnosis not present

## 2020-12-01 DIAGNOSIS — I480 Paroxysmal atrial fibrillation: Secondary | ICD-10-CM | POA: Diagnosis not present

## 2020-12-01 DIAGNOSIS — I340 Nonrheumatic mitral (valve) insufficiency: Secondary | ICD-10-CM | POA: Diagnosis not present

## 2020-12-01 DIAGNOSIS — I69991 Dysphagia following unspecified cerebrovascular disease: Secondary | ICD-10-CM | POA: Diagnosis not present

## 2020-12-01 DIAGNOSIS — Z7901 Long term (current) use of anticoagulants: Secondary | ICD-10-CM | POA: Diagnosis not present

## 2020-12-01 DIAGNOSIS — Z741 Need for assistance with personal care: Secondary | ICD-10-CM | POA: Diagnosis not present

## 2020-12-01 DIAGNOSIS — Z743 Need for continuous supervision: Secondary | ICD-10-CM | POA: Diagnosis not present

## 2020-12-01 DIAGNOSIS — I5030 Unspecified diastolic (congestive) heart failure: Secondary | ICD-10-CM | POA: Diagnosis not present

## 2020-12-01 DIAGNOSIS — I130 Hypertensive heart and chronic kidney disease with heart failure and stage 1 through stage 4 chronic kidney disease, or unspecified chronic kidney disease: Secondary | ICD-10-CM | POA: Diagnosis not present

## 2020-12-01 DIAGNOSIS — R1310 Dysphagia, unspecified: Secondary | ICD-10-CM | POA: Diagnosis not present

## 2020-12-01 DIAGNOSIS — I2510 Atherosclerotic heart disease of native coronary artery without angina pectoris: Secondary | ICD-10-CM | POA: Diagnosis not present

## 2020-12-01 DIAGNOSIS — J449 Chronic obstructive pulmonary disease, unspecified: Secondary | ICD-10-CM | POA: Diagnosis not present

## 2020-12-01 DIAGNOSIS — N1832 Chronic kidney disease, stage 3b: Secondary | ICD-10-CM | POA: Diagnosis not present

## 2020-12-01 DIAGNOSIS — Z9181 History of falling: Secondary | ICD-10-CM | POA: Diagnosis not present

## 2020-12-01 DIAGNOSIS — M150 Primary generalized (osteo)arthritis: Secondary | ICD-10-CM | POA: Diagnosis not present

## 2020-12-03 DIAGNOSIS — I5030 Unspecified diastolic (congestive) heart failure: Secondary | ICD-10-CM | POA: Diagnosis not present

## 2020-12-03 DIAGNOSIS — I69991 Dysphagia following unspecified cerebrovascular disease: Secondary | ICD-10-CM | POA: Diagnosis not present

## 2020-12-03 DIAGNOSIS — I130 Hypertensive heart and chronic kidney disease with heart failure and stage 1 through stage 4 chronic kidney disease, or unspecified chronic kidney disease: Secondary | ICD-10-CM | POA: Diagnosis not present

## 2020-12-03 DIAGNOSIS — I480 Paroxysmal atrial fibrillation: Secondary | ICD-10-CM | POA: Diagnosis not present

## 2020-12-03 DIAGNOSIS — I69354 Hemiplegia and hemiparesis following cerebral infarction affecting left non-dominant side: Secondary | ICD-10-CM | POA: Diagnosis not present

## 2020-12-03 DIAGNOSIS — N1832 Chronic kidney disease, stage 3b: Secondary | ICD-10-CM | POA: Diagnosis not present

## 2020-12-05 DIAGNOSIS — I480 Paroxysmal atrial fibrillation: Secondary | ICD-10-CM | POA: Diagnosis not present

## 2020-12-05 DIAGNOSIS — N1832 Chronic kidney disease, stage 3b: Secondary | ICD-10-CM | POA: Diagnosis not present

## 2020-12-05 DIAGNOSIS — I69991 Dysphagia following unspecified cerebrovascular disease: Secondary | ICD-10-CM | POA: Diagnosis not present

## 2020-12-05 DIAGNOSIS — I69354 Hemiplegia and hemiparesis following cerebral infarction affecting left non-dominant side: Secondary | ICD-10-CM | POA: Diagnosis not present

## 2020-12-05 DIAGNOSIS — I130 Hypertensive heart and chronic kidney disease with heart failure and stage 1 through stage 4 chronic kidney disease, or unspecified chronic kidney disease: Secondary | ICD-10-CM | POA: Diagnosis not present

## 2020-12-05 DIAGNOSIS — I5030 Unspecified diastolic (congestive) heart failure: Secondary | ICD-10-CM | POA: Diagnosis not present

## 2020-12-25 DIAGNOSIS — I69354 Hemiplegia and hemiparesis following cerebral infarction affecting left non-dominant side: Secondary | ICD-10-CM | POA: Diagnosis not present

## 2020-12-25 DIAGNOSIS — I130 Hypertensive heart and chronic kidney disease with heart failure and stage 1 through stage 4 chronic kidney disease, or unspecified chronic kidney disease: Secondary | ICD-10-CM | POA: Diagnosis not present

## 2020-12-25 DIAGNOSIS — I480 Paroxysmal atrial fibrillation: Secondary | ICD-10-CM | POA: Diagnosis not present

## 2020-12-25 DIAGNOSIS — I69991 Dysphagia following unspecified cerebrovascular disease: Secondary | ICD-10-CM | POA: Diagnosis not present

## 2020-12-25 DIAGNOSIS — N1832 Chronic kidney disease, stage 3b: Secondary | ICD-10-CM | POA: Diagnosis not present

## 2020-12-25 DIAGNOSIS — I5030 Unspecified diastolic (congestive) heart failure: Secondary | ICD-10-CM | POA: Diagnosis not present

## 2020-12-26 DIAGNOSIS — I69354 Hemiplegia and hemiparesis following cerebral infarction affecting left non-dominant side: Secondary | ICD-10-CM | POA: Diagnosis not present

## 2020-12-26 DIAGNOSIS — I130 Hypertensive heart and chronic kidney disease with heart failure and stage 1 through stage 4 chronic kidney disease, or unspecified chronic kidney disease: Secondary | ICD-10-CM | POA: Diagnosis not present

## 2020-12-26 DIAGNOSIS — N1832 Chronic kidney disease, stage 3b: Secondary | ICD-10-CM | POA: Diagnosis not present

## 2020-12-26 DIAGNOSIS — I5030 Unspecified diastolic (congestive) heart failure: Secondary | ICD-10-CM | POA: Diagnosis not present

## 2020-12-27 DIAGNOSIS — I480 Paroxysmal atrial fibrillation: Secondary | ICD-10-CM | POA: Diagnosis not present

## 2020-12-27 DIAGNOSIS — I130 Hypertensive heart and chronic kidney disease with heart failure and stage 1 through stage 4 chronic kidney disease, or unspecified chronic kidney disease: Secondary | ICD-10-CM | POA: Diagnosis not present

## 2020-12-27 DIAGNOSIS — I69991 Dysphagia following unspecified cerebrovascular disease: Secondary | ICD-10-CM | POA: Diagnosis not present

## 2020-12-27 DIAGNOSIS — I69354 Hemiplegia and hemiparesis following cerebral infarction affecting left non-dominant side: Secondary | ICD-10-CM | POA: Diagnosis not present

## 2020-12-27 DIAGNOSIS — N1832 Chronic kidney disease, stage 3b: Secondary | ICD-10-CM | POA: Diagnosis not present

## 2020-12-27 DIAGNOSIS — I5030 Unspecified diastolic (congestive) heart failure: Secondary | ICD-10-CM | POA: Diagnosis not present

## 2020-12-28 ENCOUNTER — Other Ambulatory Visit: Payer: Self-pay | Admitting: Adult Health

## 2020-12-28 DIAGNOSIS — I480 Paroxysmal atrial fibrillation: Secondary | ICD-10-CM

## 2020-12-28 DIAGNOSIS — I639 Cerebral infarction, unspecified: Secondary | ICD-10-CM

## 2020-12-28 DIAGNOSIS — I5032 Chronic diastolic (congestive) heart failure: Secondary | ICD-10-CM

## 2020-12-28 DIAGNOSIS — I10 Essential (primary) hypertension: Secondary | ICD-10-CM

## 2020-12-30 DIAGNOSIS — I130 Hypertensive heart and chronic kidney disease with heart failure and stage 1 through stage 4 chronic kidney disease, or unspecified chronic kidney disease: Secondary | ICD-10-CM | POA: Diagnosis not present

## 2020-12-30 DIAGNOSIS — N1832 Chronic kidney disease, stage 3b: Secondary | ICD-10-CM | POA: Diagnosis not present

## 2020-12-30 DIAGNOSIS — I480 Paroxysmal atrial fibrillation: Secondary | ICD-10-CM | POA: Diagnosis not present

## 2020-12-30 DIAGNOSIS — I69991 Dysphagia following unspecified cerebrovascular disease: Secondary | ICD-10-CM | POA: Diagnosis not present

## 2020-12-30 DIAGNOSIS — I5030 Unspecified diastolic (congestive) heart failure: Secondary | ICD-10-CM | POA: Diagnosis not present

## 2020-12-30 DIAGNOSIS — I69354 Hemiplegia and hemiparesis following cerebral infarction affecting left non-dominant side: Secondary | ICD-10-CM | POA: Diagnosis not present

## 2020-12-31 DIAGNOSIS — U071 COVID-19: Secondary | ICD-10-CM | POA: Diagnosis not present

## 2020-12-31 DIAGNOSIS — I69354 Hemiplegia and hemiparesis following cerebral infarction affecting left non-dominant side: Secondary | ICD-10-CM | POA: Diagnosis not present

## 2021-01-02 DIAGNOSIS — I69354 Hemiplegia and hemiparesis following cerebral infarction affecting left non-dominant side: Secondary | ICD-10-CM | POA: Diagnosis not present

## 2021-01-02 DIAGNOSIS — U071 COVID-19: Secondary | ICD-10-CM | POA: Diagnosis not present

## 2021-01-08 DIAGNOSIS — U071 COVID-19: Secondary | ICD-10-CM | POA: Diagnosis not present

## 2021-01-08 DIAGNOSIS — I69354 Hemiplegia and hemiparesis following cerebral infarction affecting left non-dominant side: Secondary | ICD-10-CM | POA: Diagnosis not present

## 2021-01-09 DIAGNOSIS — I69354 Hemiplegia and hemiparesis following cerebral infarction affecting left non-dominant side: Secondary | ICD-10-CM | POA: Diagnosis not present

## 2021-01-09 DIAGNOSIS — U071 COVID-19: Secondary | ICD-10-CM | POA: Diagnosis not present

## 2021-01-10 DIAGNOSIS — I69354 Hemiplegia and hemiparesis following cerebral infarction affecting left non-dominant side: Secondary | ICD-10-CM | POA: Diagnosis not present

## 2021-01-10 DIAGNOSIS — U071 COVID-19: Secondary | ICD-10-CM | POA: Diagnosis not present

## 2021-01-13 DIAGNOSIS — U071 COVID-19: Secondary | ICD-10-CM | POA: Diagnosis not present

## 2021-01-13 DIAGNOSIS — I69354 Hemiplegia and hemiparesis following cerebral infarction affecting left non-dominant side: Secondary | ICD-10-CM | POA: Diagnosis not present

## 2021-01-17 DIAGNOSIS — U071 COVID-19: Secondary | ICD-10-CM | POA: Diagnosis not present

## 2021-01-17 DIAGNOSIS — I69354 Hemiplegia and hemiparesis following cerebral infarction affecting left non-dominant side: Secondary | ICD-10-CM | POA: Diagnosis not present

## 2021-01-22 DIAGNOSIS — I69354 Hemiplegia and hemiparesis following cerebral infarction affecting left non-dominant side: Secondary | ICD-10-CM | POA: Diagnosis not present

## 2021-01-22 DIAGNOSIS — U071 COVID-19: Secondary | ICD-10-CM | POA: Diagnosis not present

## 2021-01-24 DIAGNOSIS — I69354 Hemiplegia and hemiparesis following cerebral infarction affecting left non-dominant side: Secondary | ICD-10-CM | POA: Diagnosis not present

## 2021-01-24 DIAGNOSIS — U071 COVID-19: Secondary | ICD-10-CM | POA: Diagnosis not present

## 2021-01-26 ENCOUNTER — Encounter: Payer: Self-pay | Admitting: Pharmacist

## 2021-01-26 NOTE — Progress Notes (Signed)
CARE PLAN ENTRY  01/26/2021 Name: Catherine Anthony MRN: [id number] DOB: 1930-10-18  Catherine Anthony is enrolled in Remote Patient Monitoring/Principle Care Monitoring.  Date of Enrollment: 05/05/2020 Supervising physician: Truett Mainland Indication: HTN  Remote Readings: {Compliant and Avg BP: 132/74, HR:73  Pharmacist Clinical Goal(s):  Marland Kitchen Over the next 90 days, patient will demonstrate Improved medication adherence as evidenced by medication fill history . Over the next 90 days, patient will demonstrate improved understanding of prescribed medications and rationale for usage as evidenced by patient teach back . Over the next 90 days, patient will experience decrease in ED visits. ED visits in last 6 months = 0 . Over the next 90 days, patient will not experience hospital admission. Hospital Admissions in last 6 months = 0  Interventions: . Provider and Inter-disciplinary care team collaboration (see longitudinal plan of care) . Comprehensive medication review performed. . Advised patient to continue salt intake restriction and continue walking  . Discussed plans with patient for ongoing care management follow up and provided patient with direct contact information for care management team . Collaboration with health plan regarding medication benefit . Collaboration with provider re: medication management  Initial goal documentation and Please see past updates related to this goal by clicking on the "Past Updates" button in the selected goal  Patient Active Problem List   Diagnosis Date Noted  . Protein-calorie malnutrition, severe 08/15/2020  . Severe sepsis (HCC) 08/15/2020  . Acute gastroenteritis 08/15/2020  . Nausea & vomiting 08/13/2020  . Lactic acidosis 08/13/2020  . Hyperglycemia 08/13/2020  . Dehydration 08/13/2020  . AKI (acute kidney injury) (HCC) 08/13/2020  . Elevated troponin 08/13/2020  . Prolonged QT interval 08/13/2020  . Diarrhea 08/13/2020  . Open  wound of left heel 08/13/2020  . Hypotension 08/13/2020  . Hypothermia 08/13/2020  . Generalized weakness 08/12/2020  . Ischemic stroke (HCC) 07/25/2020  . Left-sided weakness 07/24/2020  . Acute left-sided weakness   . Hypokalemia   . Leg edema, left 12/11/2019  . Frequent unifocal PVCs 08/09/2019  . Indication present for endocarditis prophylaxis 08/09/2019  . Mixed hyperlipidemia 08/01/2019  . Acute on chronic diastolic heart failure (HCC) 06/06/2019  . S/P TAVR (transcatheter aortic valve replacement) 06/06/2019  . Essential hypertension   . High cholesterol   . Coronary artery disease   . PAF (paroxysmal atrial fibrillation) (HCC)   . Moderate aortic regurgitation   . Coronary artery disease with stable angina pectoris (HCC) 08/31/2017   Past Surgical History:  Procedure Laterality Date  . CATARACT EXTRACTION W/ INTRAOCULAR LENS IMPLANT Right 06/04/2011   [facility]  . CATARACT EXTRACTION W/PHACO  06/22/2011   Procedure: CATARACT EXTRACTION PHACO AND INTRAOCULAR LENS PLACEMENT (IOC);  Surgeon: Gemma Payor;  Location: [facility];  Service: Ophthalmology;  Laterality: Left;  CDE 14.34  . COLONOSCOPY  06/23/08  . COLONOSCOPY N/A 01/18/2014   Procedure: COLONOSCOPY;  Surgeon: Malissa Hippo, MD;  Location: [facility];  Service: Endoscopy;  Laterality: N/A;  145  . CORONARY ANGIOPLASTY WITH STENT PLACEMENT    . CORONARY ATHERECTOMY N/A 09/21/2017   Procedure: CORONARY ATHERECTOMY;  Surgeon: Elder Negus, MD;  Location: [facility] INVASIVE CV LAB;  Service: Cardiovascular;  Laterality: N/A;  . CORONARY STENT INTERVENTION N/A 08/31/2017   Procedure: CORONARY STENT INTERVENTION;  Surgeon: Elder Negus, MD;  Location: [facility] INVASIVE CV LAB;  Service: Cardiovascular;  Laterality: N/A;  . CORONARY STENT INTERVENTION N/A 09/21/2017   Procedure: CORONARY STENT INTERVENTION;  Surgeon: Elder Negus,  MD;  Location: [facility]AS[facility] CV LAB;  Service: Cardiovascular;  Laterality: N/A;  . EYE  SURGERY Bilateral    "after cataract OR"  . INTRAVASCULAR ULTRASOUND/IVUS N/A 09/21/2017   Procedure: Intravascular Ultrasound/IVUS;  Surgeon: Elder Negus, MD;  Location: [facility]VAS[facility] CV LAB;  Service: Cardiovascular;  Laterality: N/A;  . RIGHT/LEFT HEART CATH AND CORONARY ANGIOGRAPHY N/A 08/31/2017   Procedure: RIGHT/LEFT HEART CATH AND CORONARY ANGIOGRAPHY;  Surgeon: Elder Negus, MD;  Location: [facility] CV [facility];  Service: Cardiovascular;  Laterality: N/A;  . RIGHT/LEFT HEART CATH AND CORONARY ANGIOGRAPHY N/A 05/16/2019   Procedure: RIGHT/LEFT HEART CATH AND CORONARY ANGIOGRAPHY;  Surgeon: Elder Negus, MD;  [facility]: [facility];  Service: Cardiovascular;  Laterality: N/A;  . TEE WITHOUT CARDIOVERSION N/A 06/06/2019   Procedure: TRANSESOPHAGEAL ECHOCARDIOGRAM (TEE);  Surgeon: Tonny Bollman, MD;  Location: [facility] INVASIVE CV LAB;  Service: Open Heart Surgery;  Laterality: N/A;  . TRANSCATHETER AORTIC VALVE REPLACEMENT, TRANSFEMORAL N/A 06/06/2019   Procedure: TRANSCATHETER AORTIC VALVE REPLACEMENT, TRANSFEMORAL;  Surgeon: Tonny Bollman, MD;  Location: [facility] INVASIVE CV LAB;  Service: Open Heart Surgery;  Laterality: N/A;  . ULTRASOUND GUIDANCE FOR VASCULAR ACCESS  09/21/2017   Procedure: Ultrasound Guidance For Vascular Access;  Surgeon: Elder Negus, MD;  Location: [facility]VASIVE CV LAB;  Service: Cardiovascular;;  . VARICOSE VEIN SURGERY Bilateral 1960s   [facility]   Social History   Socioeconomic History  . Marital status: Widowed    Spouse name: Not on file  . Number of children: 5  . Years of education: Not on file  . Highest education level: Not on file  Occupational History  . Not on file  Tobacco Use  . Smoking status: Never Smoker  . Smokeless tobacco: Never Used  Vaping Use  . Vaping Use: Never used  Substance and Sexual Activity  . Alcohol use: No  . Drug use: No  . Sexual activity: Not on file  Other Topics Concern  . Not on file  Social  History Narrative   1 son deceased   Social Determinants of Health   Financial Resource Strain: Not on file  Food Insecurity: Not on file  Transportation Needs: Not on file  Physical Activity: Not on file  Stress: Not on file  Social Connections: Not on file   Family History  Problem Relation Age of Onset  . Hypertension Mother    Allergies  Allergen Reactions  . Codeine Nausea And Vomiting   Outpatient Encounter Medications as of 01/26/2021  Medication Sig  . amiodarone (PACERONE) 200 MG tablet Take 1 tablet (200 mg total) by mouth as directed. Monday and Friday (Patient taking differently: Take 200 mg by mouth See admin instructions. Take 200 mg on Monday and Friday.)  . apixaban (ELIQUIS) 2.5 MG TABS tablet TAKE (1) TABLET BY MOUTH TWICE DAILY. (Patient taking differently: Take 2.5 mg by mouth 2 (two) times daily. )  . Cholecalciferol (VITAMIN D3) 50 MCG (2000 UT) TABS Take 2 tablets by mouth daily.   . feeding supplement, ENSURE ENLIVE, (ENSURE ENLIVE) LIQD Take 237 mLs by mouth 2 (two) times daily between meals.  . metoprolol tartrate (LOPRESSOR) 25 MG tablet Take 0.5 tablets (12.5 mg total) by mouth 2 (two) times daily. TAKE (1) TABLET BY MOUTH TWICE DAILY.  . mirtazapine (REMERON) 15 MG tablet TAKE 1/2 TABLET BY MOUTH AT BEDTIME.  . nitroGLYCERIN (NITROSTAT) 0.4 MG SL tablet Place 1 tablet (0.4 mg total) under the tongue every 5 (five) minutes  x 3 doses as needed for chest pain.  . rosuvastatin (CRESTOR) 20 MG tablet TAKE (1) TABLET BY MOUTH ONCE DAILY. (Patient taking differently: Take 20 mg by mouth daily. )  . saccharomyces boulardii (FLORASTOR) 250 MG capsule Take 1 capsule (250 mg total) by mouth 2 (two) times daily.  Jeananne Rama Sulfate (EYE DROPS ALLERGY RELIEF OP) Place 1 drop into both eyes 3 (three) times daily as needed (allergy).    No facility-administered encounter medications on file as of 01/26/2021.   Patient Care Team    Relationship Specialty  Notifications Start End  [name], MD PCP - General Internal Medicine  03/16/11     Current Diagnosis/Assessment: Goals Addressed   None    Hypertension   BP today is:  <130/80  Office blood pressures are  BP Readings from Last 3 Encounters:  08/15/20 135/64  08/01/20 98/66  07/30/20 131/69    Patient has failed these meds in the past: lisinopril, atenolol, amlodipine, hydralazine, spironolactone  Patient checks BP at home daily  Patient home BP readings are ranging: 109-162/56-107  We discussed diet and exercise extensively  Plan  Continue current medications and control with diet and exercise     ______________ Visit Information SDOH (Social Determinants of Health) assessments performed: Yes.  Ms. Much was given information about Principle Care Management/Remote Patient Monitoring services today including:  1. RPM/PCM service includes personalized support from designated clinical staff supervised by her physician, including individualized plan of care and coordination with other care providers 2. 24/7 contact phone numbers for assistance for urgent and routine care needs. 3. Standard insurance, coinsurance, copays and deductibles apply for principle care management only during months in which we provide at least 30 minutes of these services. Most insurances cover these services at 100%, however patients may be responsible for any copay, coinsurance and/or deductible if applicable. This service may help you avoid the need for more expensive face-to-face services. 4. Only one practitioner may furnish and bill the service in a calendar month. 5. The patient may stop PCM/RPM services at any time (effective at the end of the month) by phone call to the office staff.  Patient agreed to services and verbal consent obtained.   Cassell Clement, Pharm.D. Clinical Pharmacist [facility] [phone]

## 2021-01-27 ENCOUNTER — Other Ambulatory Visit: Payer: Self-pay | Admitting: Adult Health

## 2021-01-27 DIAGNOSIS — I639 Cerebral infarction, unspecified: Secondary | ICD-10-CM

## 2021-01-27 DIAGNOSIS — I10 Essential (primary) hypertension: Secondary | ICD-10-CM

## 2021-01-27 DIAGNOSIS — U071 COVID-19: Secondary | ICD-10-CM | POA: Diagnosis not present

## 2021-01-27 DIAGNOSIS — I480 Paroxysmal atrial fibrillation: Secondary | ICD-10-CM

## 2021-01-27 DIAGNOSIS — I5032 Chronic diastolic (congestive) heart failure: Secondary | ICD-10-CM

## 2021-01-27 DIAGNOSIS — I69354 Hemiplegia and hemiparesis following cerebral infarction affecting left non-dominant side: Secondary | ICD-10-CM | POA: Diagnosis not present

## 2021-01-28 DIAGNOSIS — U071 COVID-19: Secondary | ICD-10-CM | POA: Diagnosis not present

## 2021-01-28 DIAGNOSIS — I69354 Hemiplegia and hemiparesis following cerebral infarction affecting left non-dominant side: Secondary | ICD-10-CM | POA: Diagnosis not present

## 2021-01-29 DIAGNOSIS — U071 COVID-19: Secondary | ICD-10-CM | POA: Diagnosis not present

## 2021-01-29 DIAGNOSIS — I69354 Hemiplegia and hemiparesis following cerebral infarction affecting left non-dominant side: Secondary | ICD-10-CM | POA: Diagnosis not present

## 2021-01-30 DIAGNOSIS — I10 Essential (primary) hypertension: Secondary | ICD-10-CM | POA: Diagnosis not present

## 2021-01-30 DIAGNOSIS — I69354 Hemiplegia and hemiparesis following cerebral infarction affecting left non-dominant side: Secondary | ICD-10-CM | POA: Diagnosis not present

## 2021-01-30 DIAGNOSIS — Z8616 Personal history of COVID-19: Secondary | ICD-10-CM | POA: Diagnosis not present

## 2021-01-30 DIAGNOSIS — I130 Hypertensive heart and chronic kidney disease with heart failure and stage 1 through stage 4 chronic kidney disease, or unspecified chronic kidney disease: Secondary | ICD-10-CM | POA: Diagnosis not present

## 2021-01-30 DIAGNOSIS — I480 Paroxysmal atrial fibrillation: Secondary | ICD-10-CM | POA: Diagnosis not present

## 2021-01-30 DIAGNOSIS — I2510 Atherosclerotic heart disease of native coronary artery without angina pectoris: Secondary | ICD-10-CM | POA: Diagnosis not present

## 2021-01-30 DIAGNOSIS — M150 Primary generalized (osteo)arthritis: Secondary | ICD-10-CM | POA: Diagnosis not present

## 2021-01-30 DIAGNOSIS — J449 Chronic obstructive pulmonary disease, unspecified: Secondary | ICD-10-CM | POA: Diagnosis not present

## 2021-01-30 DIAGNOSIS — Z7901 Long term (current) use of anticoagulants: Secondary | ICD-10-CM | POA: Diagnosis not present

## 2021-01-30 DIAGNOSIS — I5030 Unspecified diastolic (congestive) heart failure: Secondary | ICD-10-CM | POA: Diagnosis not present

## 2021-01-30 DIAGNOSIS — N1832 Chronic kidney disease, stage 3b: Secondary | ICD-10-CM | POA: Diagnosis not present

## 2021-02-03 DIAGNOSIS — I480 Paroxysmal atrial fibrillation: Secondary | ICD-10-CM | POA: Diagnosis not present

## 2021-02-03 DIAGNOSIS — I69354 Hemiplegia and hemiparesis following cerebral infarction affecting left non-dominant side: Secondary | ICD-10-CM | POA: Diagnosis not present

## 2021-02-03 DIAGNOSIS — I130 Hypertensive heart and chronic kidney disease with heart failure and stage 1 through stage 4 chronic kidney disease, or unspecified chronic kidney disease: Secondary | ICD-10-CM | POA: Diagnosis not present

## 2021-02-03 DIAGNOSIS — N1832 Chronic kidney disease, stage 3b: Secondary | ICD-10-CM | POA: Diagnosis not present

## 2021-02-03 DIAGNOSIS — I5030 Unspecified diastolic (congestive) heart failure: Secondary | ICD-10-CM | POA: Diagnosis not present

## 2021-02-03 DIAGNOSIS — M150 Primary generalized (osteo)arthritis: Secondary | ICD-10-CM | POA: Diagnosis not present

## 2021-02-07 DIAGNOSIS — I480 Paroxysmal atrial fibrillation: Secondary | ICD-10-CM | POA: Diagnosis not present

## 2021-02-07 DIAGNOSIS — I5030 Unspecified diastolic (congestive) heart failure: Secondary | ICD-10-CM | POA: Diagnosis not present

## 2021-02-07 DIAGNOSIS — M150 Primary generalized (osteo)arthritis: Secondary | ICD-10-CM | POA: Diagnosis not present

## 2021-02-07 DIAGNOSIS — I130 Hypertensive heart and chronic kidney disease with heart failure and stage 1 through stage 4 chronic kidney disease, or unspecified chronic kidney disease: Secondary | ICD-10-CM | POA: Diagnosis not present

## 2021-02-07 DIAGNOSIS — I69354 Hemiplegia and hemiparesis following cerebral infarction affecting left non-dominant side: Secondary | ICD-10-CM | POA: Diagnosis not present

## 2021-02-07 DIAGNOSIS — N1832 Chronic kidney disease, stage 3b: Secondary | ICD-10-CM | POA: Diagnosis not present

## 2021-02-10 DIAGNOSIS — N1832 Chronic kidney disease, stage 3b: Secondary | ICD-10-CM | POA: Diagnosis not present

## 2021-02-10 DIAGNOSIS — I130 Hypertensive heart and chronic kidney disease with heart failure and stage 1 through stage 4 chronic kidney disease, or unspecified chronic kidney disease: Secondary | ICD-10-CM | POA: Diagnosis not present

## 2021-02-10 DIAGNOSIS — M150 Primary generalized (osteo)arthritis: Secondary | ICD-10-CM | POA: Diagnosis not present

## 2021-02-10 DIAGNOSIS — I69354 Hemiplegia and hemiparesis following cerebral infarction affecting left non-dominant side: Secondary | ICD-10-CM | POA: Diagnosis not present

## 2021-02-10 DIAGNOSIS — I480 Paroxysmal atrial fibrillation: Secondary | ICD-10-CM | POA: Diagnosis not present

## 2021-02-10 DIAGNOSIS — I5030 Unspecified diastolic (congestive) heart failure: Secondary | ICD-10-CM | POA: Diagnosis not present

## 2021-02-13 DIAGNOSIS — N1832 Chronic kidney disease, stage 3b: Secondary | ICD-10-CM | POA: Diagnosis not present

## 2021-02-13 DIAGNOSIS — I69354 Hemiplegia and hemiparesis following cerebral infarction affecting left non-dominant side: Secondary | ICD-10-CM | POA: Diagnosis not present

## 2021-02-13 DIAGNOSIS — M150 Primary generalized (osteo)arthritis: Secondary | ICD-10-CM | POA: Diagnosis not present

## 2021-02-13 DIAGNOSIS — I5030 Unspecified diastolic (congestive) heart failure: Secondary | ICD-10-CM | POA: Diagnosis not present

## 2021-02-13 DIAGNOSIS — I130 Hypertensive heart and chronic kidney disease with heart failure and stage 1 through stage 4 chronic kidney disease, or unspecified chronic kidney disease: Secondary | ICD-10-CM | POA: Diagnosis not present

## 2021-02-13 DIAGNOSIS — I480 Paroxysmal atrial fibrillation: Secondary | ICD-10-CM | POA: Diagnosis not present

## 2021-02-17 DIAGNOSIS — M79675 Pain in left toe(s): Secondary | ICD-10-CM | POA: Diagnosis not present

## 2021-02-17 DIAGNOSIS — B351 Tinea unguium: Secondary | ICD-10-CM | POA: Diagnosis not present

## 2021-02-17 DIAGNOSIS — I739 Peripheral vascular disease, unspecified: Secondary | ICD-10-CM | POA: Diagnosis not present

## 2021-02-17 DIAGNOSIS — L600 Ingrowing nail: Secondary | ICD-10-CM | POA: Diagnosis not present

## 2021-02-18 DIAGNOSIS — I69354 Hemiplegia and hemiparesis following cerebral infarction affecting left non-dominant side: Secondary | ICD-10-CM | POA: Diagnosis not present

## 2021-02-18 DIAGNOSIS — M150 Primary generalized (osteo)arthritis: Secondary | ICD-10-CM | POA: Diagnosis not present

## 2021-02-18 DIAGNOSIS — N1832 Chronic kidney disease, stage 3b: Secondary | ICD-10-CM | POA: Diagnosis not present

## 2021-02-18 DIAGNOSIS — I480 Paroxysmal atrial fibrillation: Secondary | ICD-10-CM | POA: Diagnosis not present

## 2021-02-18 DIAGNOSIS — I5030 Unspecified diastolic (congestive) heart failure: Secondary | ICD-10-CM | POA: Diagnosis not present

## 2021-02-18 DIAGNOSIS — I130 Hypertensive heart and chronic kidney disease with heart failure and stage 1 through stage 4 chronic kidney disease, or unspecified chronic kidney disease: Secondary | ICD-10-CM | POA: Diagnosis not present

## 2021-02-20 DIAGNOSIS — I480 Paroxysmal atrial fibrillation: Secondary | ICD-10-CM | POA: Diagnosis not present

## 2021-02-20 DIAGNOSIS — I69354 Hemiplegia and hemiparesis following cerebral infarction affecting left non-dominant side: Secondary | ICD-10-CM | POA: Diagnosis not present

## 2021-02-20 DIAGNOSIS — I130 Hypertensive heart and chronic kidney disease with heart failure and stage 1 through stage 4 chronic kidney disease, or unspecified chronic kidney disease: Secondary | ICD-10-CM | POA: Diagnosis not present

## 2021-02-20 DIAGNOSIS — M150 Primary generalized (osteo)arthritis: Secondary | ICD-10-CM | POA: Diagnosis not present

## 2021-02-20 DIAGNOSIS — I5030 Unspecified diastolic (congestive) heart failure: Secondary | ICD-10-CM | POA: Diagnosis not present

## 2021-02-20 DIAGNOSIS — N1832 Chronic kidney disease, stage 3b: Secondary | ICD-10-CM | POA: Diagnosis not present

## 2021-02-24 DIAGNOSIS — I5030 Unspecified diastolic (congestive) heart failure: Secondary | ICD-10-CM | POA: Diagnosis not present

## 2021-02-24 DIAGNOSIS — I480 Paroxysmal atrial fibrillation: Secondary | ICD-10-CM | POA: Diagnosis not present

## 2021-02-24 DIAGNOSIS — N1832 Chronic kidney disease, stage 3b: Secondary | ICD-10-CM | POA: Diagnosis not present

## 2021-02-24 DIAGNOSIS — I69354 Hemiplegia and hemiparesis following cerebral infarction affecting left non-dominant side: Secondary | ICD-10-CM | POA: Diagnosis not present

## 2021-02-24 DIAGNOSIS — I130 Hypertensive heart and chronic kidney disease with heart failure and stage 1 through stage 4 chronic kidney disease, or unspecified chronic kidney disease: Secondary | ICD-10-CM | POA: Diagnosis not present

## 2021-02-24 DIAGNOSIS — M150 Primary generalized (osteo)arthritis: Secondary | ICD-10-CM | POA: Diagnosis not present

## 2021-02-25 ENCOUNTER — Other Ambulatory Visit: Payer: Self-pay | Admitting: Adult Health

## 2021-02-25 DIAGNOSIS — I5032 Chronic diastolic (congestive) heart failure: Secondary | ICD-10-CM

## 2021-02-25 DIAGNOSIS — I639 Cerebral infarction, unspecified: Secondary | ICD-10-CM

## 2021-02-25 DIAGNOSIS — I10 Essential (primary) hypertension: Secondary | ICD-10-CM

## 2021-02-25 DIAGNOSIS — I480 Paroxysmal atrial fibrillation: Secondary | ICD-10-CM

## 2021-02-26 ENCOUNTER — Other Ambulatory Visit: Payer: Self-pay | Admitting: Adult Health

## 2021-02-26 DIAGNOSIS — I5032 Chronic diastolic (congestive) heart failure: Secondary | ICD-10-CM

## 2021-02-26 DIAGNOSIS — I10 Essential (primary) hypertension: Secondary | ICD-10-CM

## 2021-02-27 DIAGNOSIS — M150 Primary generalized (osteo)arthritis: Secondary | ICD-10-CM | POA: Diagnosis not present

## 2021-02-27 DIAGNOSIS — I130 Hypertensive heart and chronic kidney disease with heart failure and stage 1 through stage 4 chronic kidney disease, or unspecified chronic kidney disease: Secondary | ICD-10-CM | POA: Diagnosis not present

## 2021-02-27 DIAGNOSIS — I5030 Unspecified diastolic (congestive) heart failure: Secondary | ICD-10-CM | POA: Diagnosis not present

## 2021-02-27 DIAGNOSIS — I480 Paroxysmal atrial fibrillation: Secondary | ICD-10-CM | POA: Diagnosis not present

## 2021-02-27 DIAGNOSIS — N1832 Chronic kidney disease, stage 3b: Secondary | ICD-10-CM | POA: Diagnosis not present

## 2021-02-27 DIAGNOSIS — I69354 Hemiplegia and hemiparesis following cerebral infarction affecting left non-dominant side: Secondary | ICD-10-CM | POA: Diagnosis not present

## 2021-03-01 DIAGNOSIS — N1832 Chronic kidney disease, stage 3b: Secondary | ICD-10-CM | POA: Diagnosis not present

## 2021-03-01 DIAGNOSIS — I10 Essential (primary) hypertension: Secondary | ICD-10-CM | POA: Diagnosis not present

## 2021-03-01 DIAGNOSIS — J449 Chronic obstructive pulmonary disease, unspecified: Secondary | ICD-10-CM | POA: Diagnosis not present

## 2021-03-01 DIAGNOSIS — I130 Hypertensive heart and chronic kidney disease with heart failure and stage 1 through stage 4 chronic kidney disease, or unspecified chronic kidney disease: Secondary | ICD-10-CM | POA: Diagnosis not present

## 2021-03-01 DIAGNOSIS — M150 Primary generalized (osteo)arthritis: Secondary | ICD-10-CM | POA: Diagnosis not present

## 2021-03-01 DIAGNOSIS — Z7901 Long term (current) use of anticoagulants: Secondary | ICD-10-CM | POA: Diagnosis not present

## 2021-03-01 DIAGNOSIS — I69354 Hemiplegia and hemiparesis following cerebral infarction affecting left non-dominant side: Secondary | ICD-10-CM | POA: Diagnosis not present

## 2021-03-01 DIAGNOSIS — I5030 Unspecified diastolic (congestive) heart failure: Secondary | ICD-10-CM | POA: Diagnosis not present

## 2021-03-01 DIAGNOSIS — I480 Paroxysmal atrial fibrillation: Secondary | ICD-10-CM | POA: Diagnosis not present

## 2021-03-01 DIAGNOSIS — I2510 Atherosclerotic heart disease of native coronary artery without angina pectoris: Secondary | ICD-10-CM | POA: Diagnosis not present

## 2021-03-01 DIAGNOSIS — Z8616 Personal history of COVID-19: Secondary | ICD-10-CM | POA: Diagnosis not present

## 2021-03-03 DIAGNOSIS — M150 Primary generalized (osteo)arthritis: Secondary | ICD-10-CM | POA: Diagnosis not present

## 2021-03-03 DIAGNOSIS — I69354 Hemiplegia and hemiparesis following cerebral infarction affecting left non-dominant side: Secondary | ICD-10-CM | POA: Diagnosis not present

## 2021-03-03 DIAGNOSIS — I480 Paroxysmal atrial fibrillation: Secondary | ICD-10-CM | POA: Diagnosis not present

## 2021-03-03 DIAGNOSIS — I130 Hypertensive heart and chronic kidney disease with heart failure and stage 1 through stage 4 chronic kidney disease, or unspecified chronic kidney disease: Secondary | ICD-10-CM | POA: Diagnosis not present

## 2021-03-03 DIAGNOSIS — N1832 Chronic kidney disease, stage 3b: Secondary | ICD-10-CM | POA: Diagnosis not present

## 2021-03-03 DIAGNOSIS — I5030 Unspecified diastolic (congestive) heart failure: Secondary | ICD-10-CM | POA: Diagnosis not present

## 2021-03-06 DIAGNOSIS — I5030 Unspecified diastolic (congestive) heart failure: Secondary | ICD-10-CM | POA: Diagnosis not present

## 2021-03-06 DIAGNOSIS — I130 Hypertensive heart and chronic kidney disease with heart failure and stage 1 through stage 4 chronic kidney disease, or unspecified chronic kidney disease: Secondary | ICD-10-CM | POA: Diagnosis not present

## 2021-03-06 DIAGNOSIS — I480 Paroxysmal atrial fibrillation: Secondary | ICD-10-CM | POA: Diagnosis not present

## 2021-03-06 DIAGNOSIS — M150 Primary generalized (osteo)arthritis: Secondary | ICD-10-CM | POA: Diagnosis not present

## 2021-03-06 DIAGNOSIS — I69354 Hemiplegia and hemiparesis following cerebral infarction affecting left non-dominant side: Secondary | ICD-10-CM | POA: Diagnosis not present

## 2021-03-06 DIAGNOSIS — N1832 Chronic kidney disease, stage 3b: Secondary | ICD-10-CM | POA: Diagnosis not present

## 2021-03-11 DIAGNOSIS — I480 Paroxysmal atrial fibrillation: Secondary | ICD-10-CM | POA: Diagnosis not present

## 2021-03-11 DIAGNOSIS — M150 Primary generalized (osteo)arthritis: Secondary | ICD-10-CM | POA: Diagnosis not present

## 2021-03-11 DIAGNOSIS — I69354 Hemiplegia and hemiparesis following cerebral infarction affecting left non-dominant side: Secondary | ICD-10-CM | POA: Diagnosis not present

## 2021-03-11 DIAGNOSIS — N1832 Chronic kidney disease, stage 3b: Secondary | ICD-10-CM | POA: Diagnosis not present

## 2021-03-11 DIAGNOSIS — I5030 Unspecified diastolic (congestive) heart failure: Secondary | ICD-10-CM | POA: Diagnosis not present

## 2021-03-11 DIAGNOSIS — I130 Hypertensive heart and chronic kidney disease with heart failure and stage 1 through stage 4 chronic kidney disease, or unspecified chronic kidney disease: Secondary | ICD-10-CM | POA: Diagnosis not present

## 2021-03-14 DIAGNOSIS — M150 Primary generalized (osteo)arthritis: Secondary | ICD-10-CM | POA: Diagnosis not present

## 2021-03-14 DIAGNOSIS — I5030 Unspecified diastolic (congestive) heart failure: Secondary | ICD-10-CM | POA: Diagnosis not present

## 2021-03-14 DIAGNOSIS — I480 Paroxysmal atrial fibrillation: Secondary | ICD-10-CM | POA: Diagnosis not present

## 2021-03-14 DIAGNOSIS — I69354 Hemiplegia and hemiparesis following cerebral infarction affecting left non-dominant side: Secondary | ICD-10-CM | POA: Diagnosis not present

## 2021-03-14 DIAGNOSIS — I130 Hypertensive heart and chronic kidney disease with heart failure and stage 1 through stage 4 chronic kidney disease, or unspecified chronic kidney disease: Secondary | ICD-10-CM | POA: Diagnosis not present

## 2021-03-14 DIAGNOSIS — N1832 Chronic kidney disease, stage 3b: Secondary | ICD-10-CM | POA: Diagnosis not present

## 2021-03-17 DIAGNOSIS — I130 Hypertensive heart and chronic kidney disease with heart failure and stage 1 through stage 4 chronic kidney disease, or unspecified chronic kidney disease: Secondary | ICD-10-CM | POA: Diagnosis not present

## 2021-03-17 DIAGNOSIS — I69354 Hemiplegia and hemiparesis following cerebral infarction affecting left non-dominant side: Secondary | ICD-10-CM | POA: Diagnosis not present

## 2021-03-17 DIAGNOSIS — I480 Paroxysmal atrial fibrillation: Secondary | ICD-10-CM | POA: Diagnosis not present

## 2021-03-17 DIAGNOSIS — I5030 Unspecified diastolic (congestive) heart failure: Secondary | ICD-10-CM | POA: Diagnosis not present

## 2021-03-17 DIAGNOSIS — N1832 Chronic kidney disease, stage 3b: Secondary | ICD-10-CM | POA: Diagnosis not present

## 2021-03-17 DIAGNOSIS — M150 Primary generalized (osteo)arthritis: Secondary | ICD-10-CM | POA: Diagnosis not present

## 2021-03-24 DIAGNOSIS — N1832 Chronic kidney disease, stage 3b: Secondary | ICD-10-CM | POA: Diagnosis not present

## 2021-03-24 DIAGNOSIS — I5030 Unspecified diastolic (congestive) heart failure: Secondary | ICD-10-CM | POA: Diagnosis not present

## 2021-03-24 DIAGNOSIS — I69354 Hemiplegia and hemiparesis following cerebral infarction affecting left non-dominant side: Secondary | ICD-10-CM | POA: Diagnosis not present

## 2021-03-24 DIAGNOSIS — I130 Hypertensive heart and chronic kidney disease with heart failure and stage 1 through stage 4 chronic kidney disease, or unspecified chronic kidney disease: Secondary | ICD-10-CM | POA: Diagnosis not present

## 2021-03-24 DIAGNOSIS — M150 Primary generalized (osteo)arthritis: Secondary | ICD-10-CM | POA: Diagnosis not present

## 2021-03-24 DIAGNOSIS — I480 Paroxysmal atrial fibrillation: Secondary | ICD-10-CM | POA: Diagnosis not present

## 2021-03-25 IMAGING — DX DG CHEST 1V PORT
1 series · 1 of 1 positions shown · non-contrast
Comparison: 07/24/2020

CLINICAL DATA: Dyspnea

EXAM:
PORTABLE CHEST 1 VIEW

[chest ap]
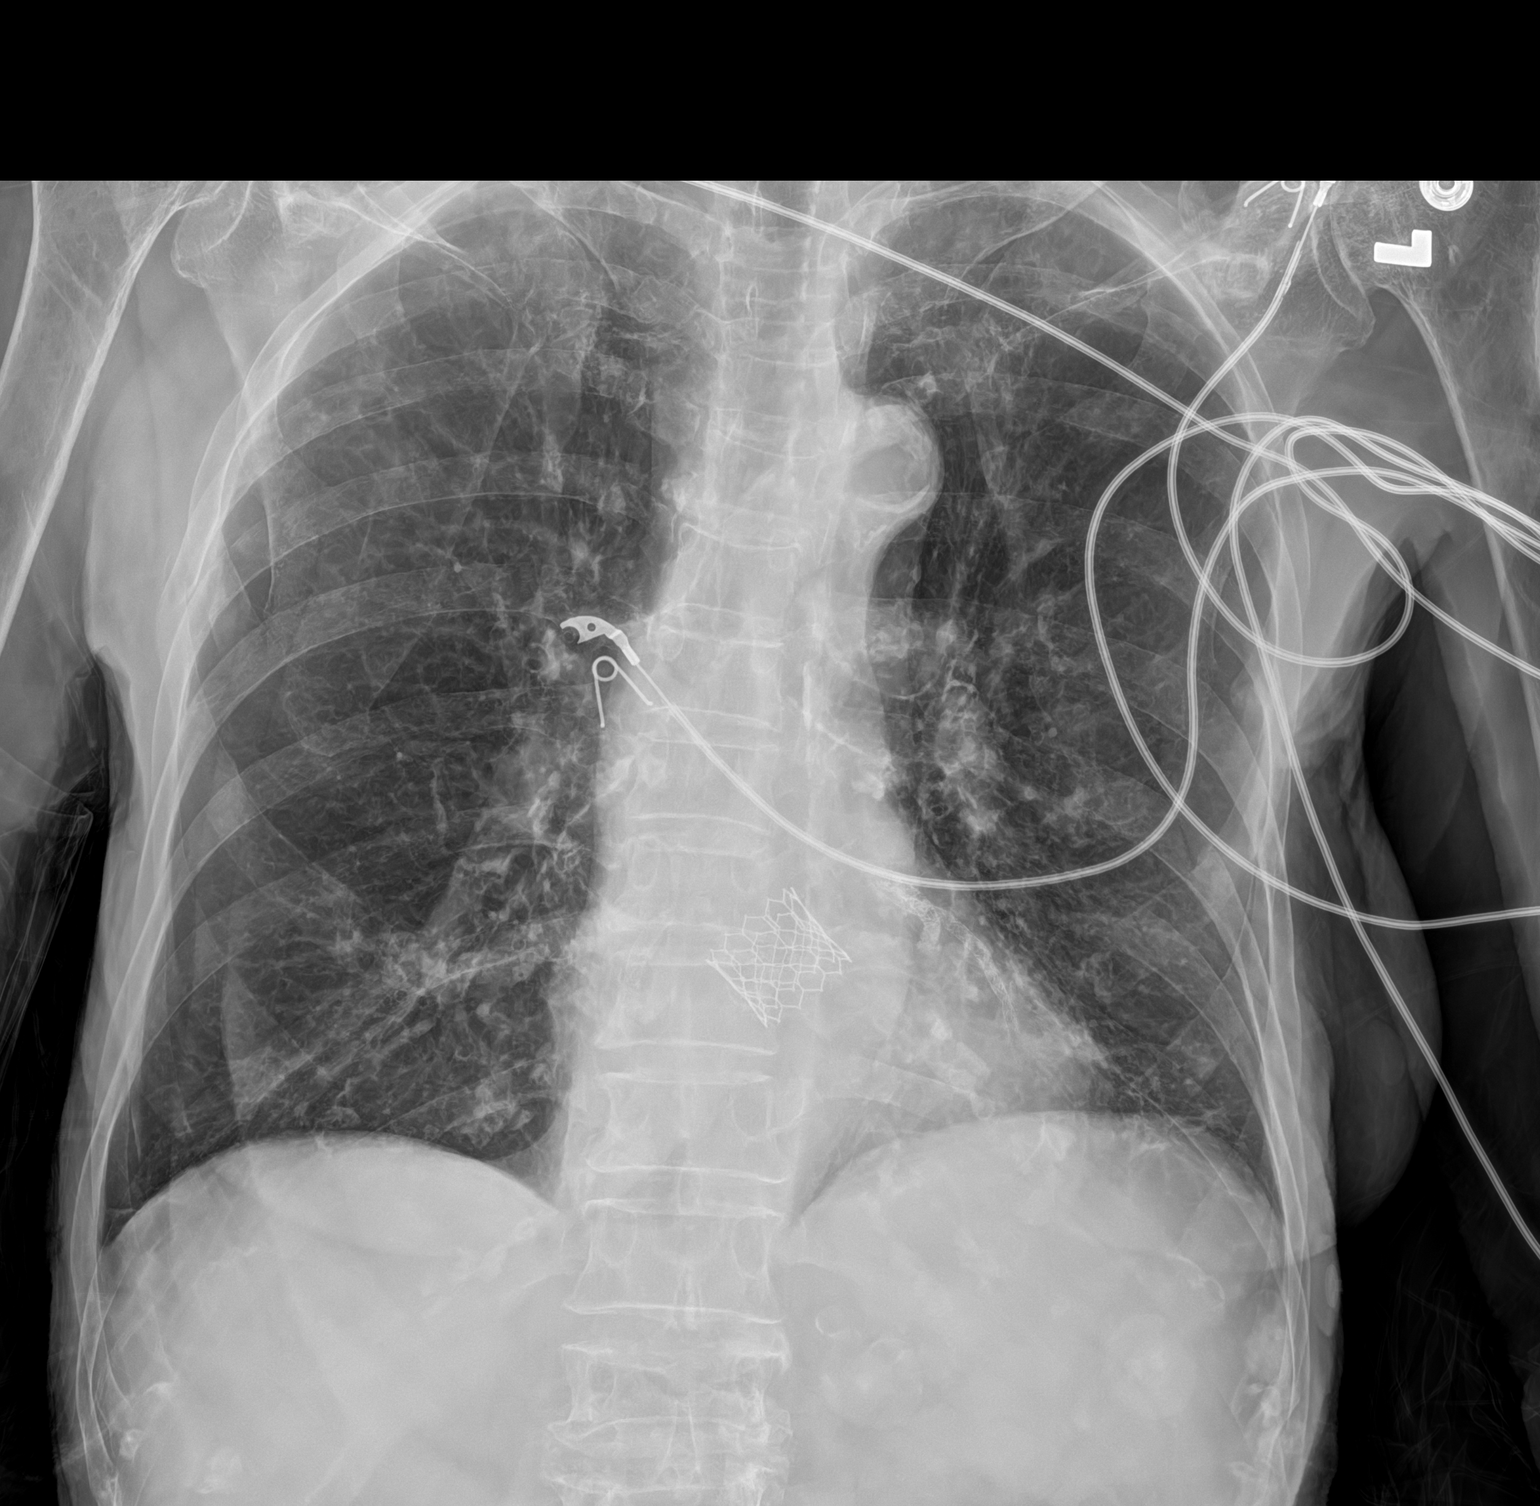

[1 of 1 positions shown; findings below may reference images not displayed]

FINDINGS: The lungs are hyperinflated with diffuse interstitial prominence. No
focal airspace consolidation or pulmonary edema. No pleural effusion
or pneumothorax. Normal cardiomediastinal contours. Status post
TAVR.
IMPRESSION: COPD without acute airspace disease.

## 2021-03-26 IMAGING — DX DG FOOT 2V*L*
2 series · 2 of 2 positions shown · non-contrast
Comparison: None.

CLINICAL DATA: Cellulitis

EXAM:
LEFT FOOT - 2 VIEW

[foot ap]
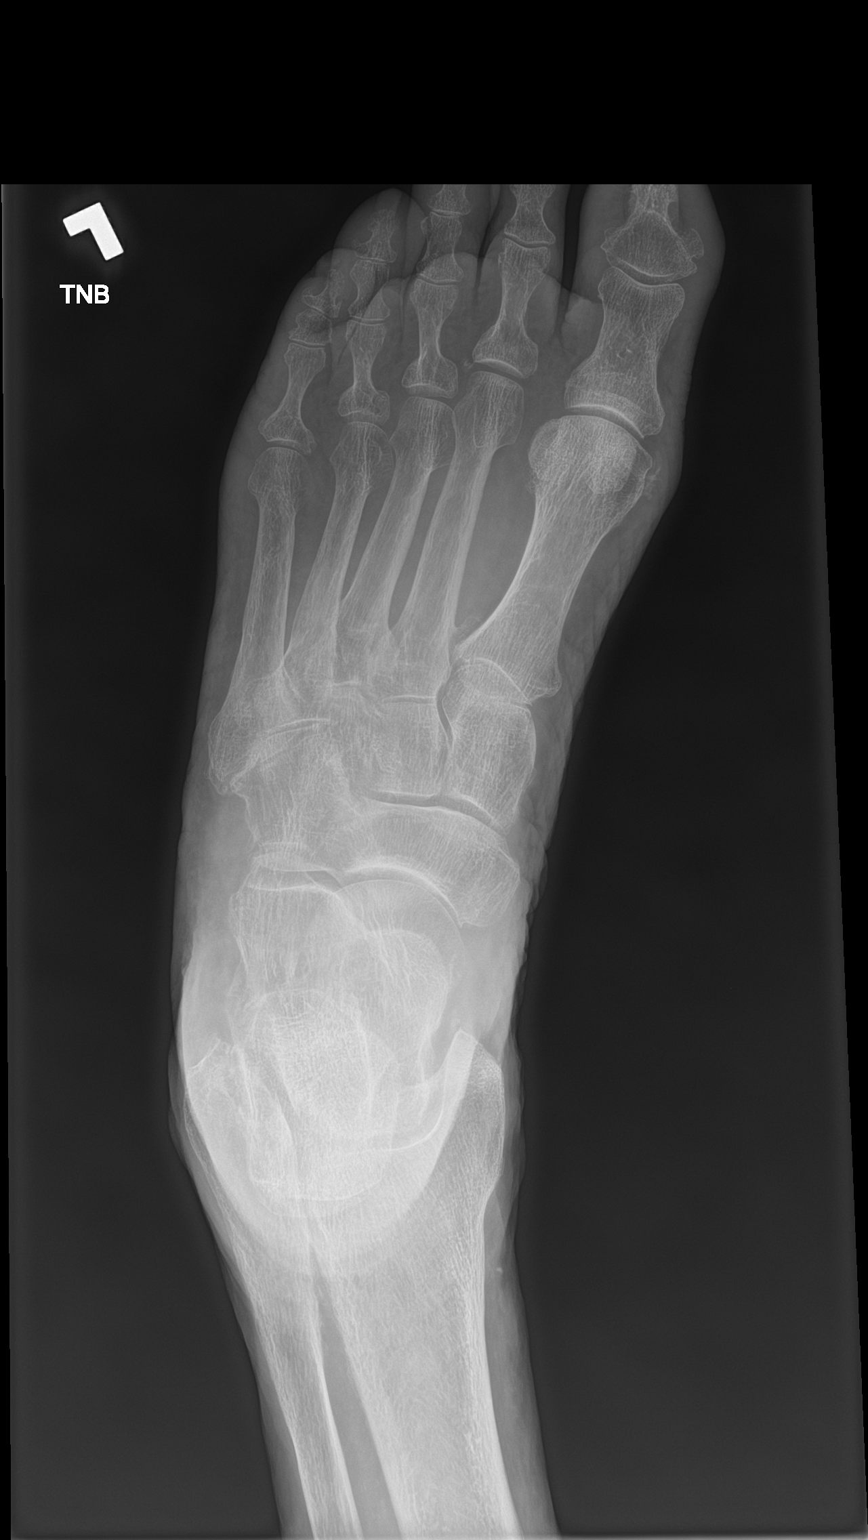

[foot lat]
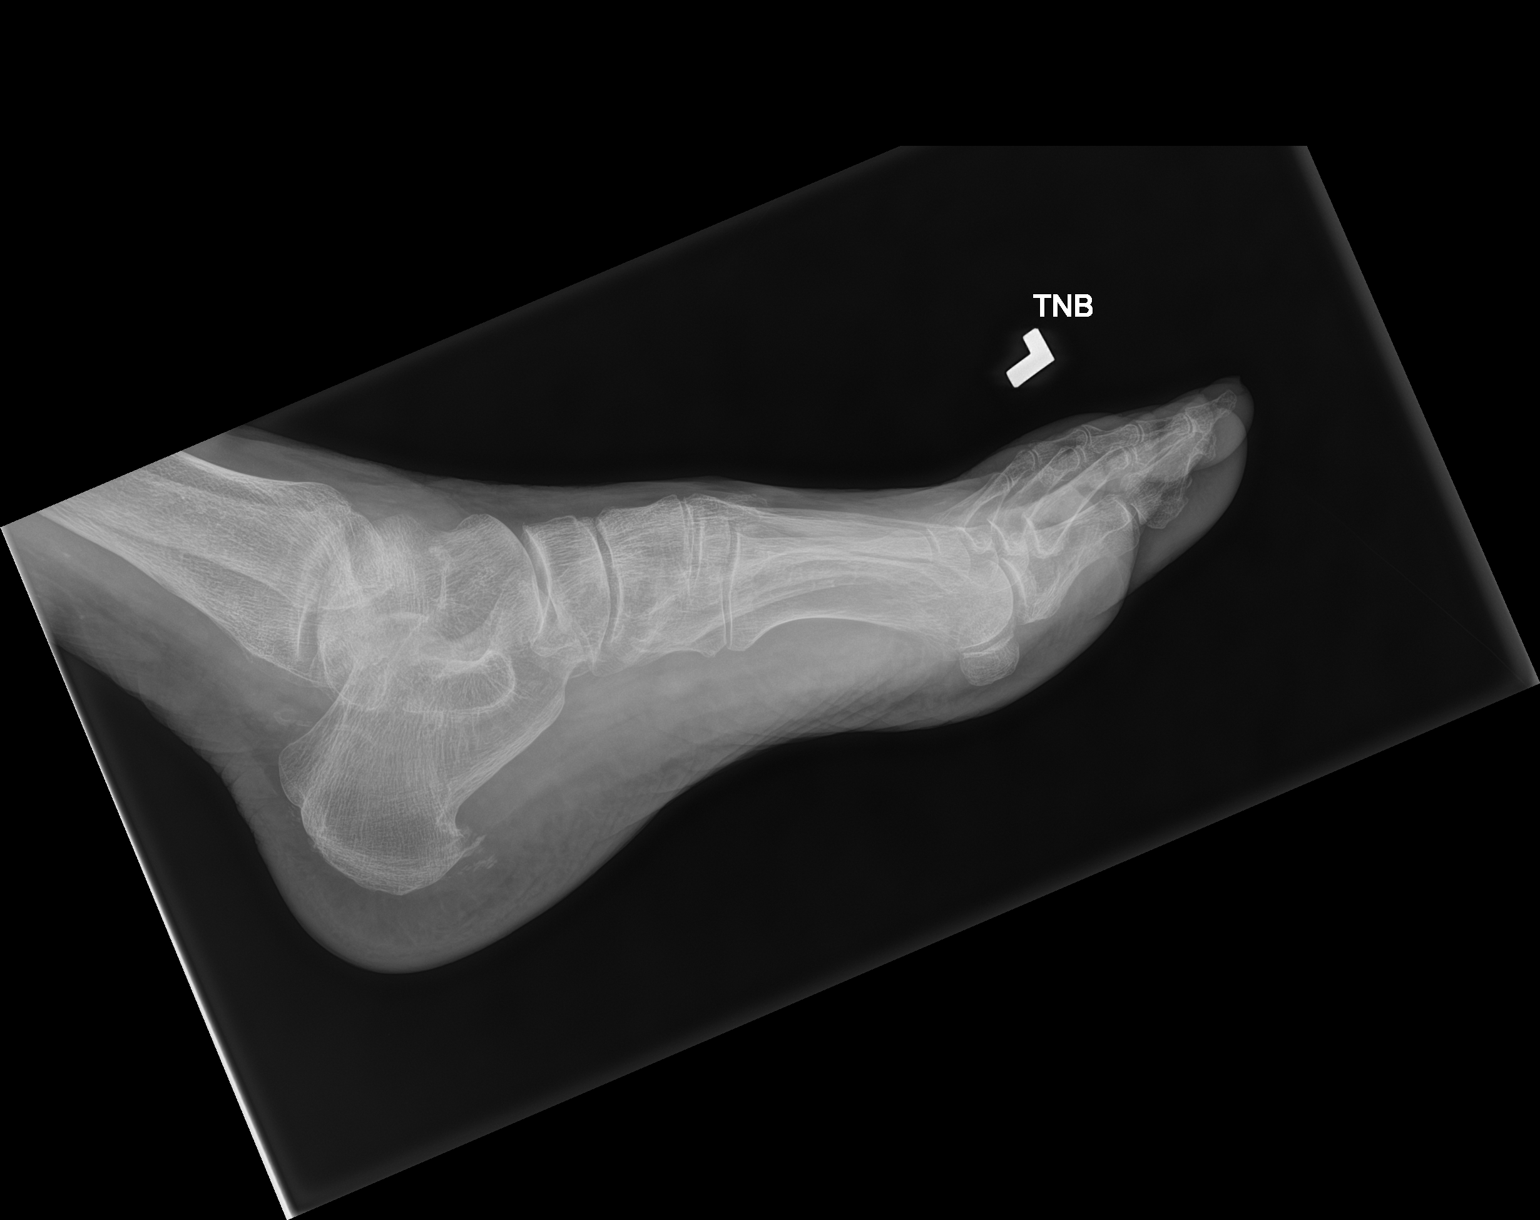

[2 of 2 positions shown; findings below may reference images not displayed]

FINDINGS: Plantar calcaneal spur. No acute bony abnormality. Specifically, no
fracture, subluxation, or dislocation. No bone destruction to
suggest osteomyelitis.
IMPRESSION: No acute bony abnormality.

## 2021-03-31 DIAGNOSIS — I10 Essential (primary) hypertension: Secondary | ICD-10-CM | POA: Diagnosis not present

## 2021-04-21 DIAGNOSIS — B351 Tinea unguium: Secondary | ICD-10-CM | POA: Diagnosis not present

## 2021-04-21 DIAGNOSIS — L600 Ingrowing nail: Secondary | ICD-10-CM | POA: Diagnosis not present

## 2021-04-21 DIAGNOSIS — I739 Peripheral vascular disease, unspecified: Secondary | ICD-10-CM | POA: Diagnosis not present

## 2021-04-21 DIAGNOSIS — M79675 Pain in left toe(s): Secondary | ICD-10-CM | POA: Diagnosis not present

## 2021-04-30 DIAGNOSIS — E7801 Familial hypercholesterolemia: Secondary | ICD-10-CM | POA: Diagnosis not present

## 2021-04-30 DIAGNOSIS — M810 Age-related osteoporosis without current pathological fracture: Secondary | ICD-10-CM | POA: Diagnosis not present

## 2021-04-30 DIAGNOSIS — I10 Essential (primary) hypertension: Secondary | ICD-10-CM | POA: Diagnosis not present

## 2021-05-07 DIAGNOSIS — I2510 Atherosclerotic heart disease of native coronary artery without angina pectoris: Secondary | ICD-10-CM | POA: Diagnosis not present

## 2021-05-07 DIAGNOSIS — E7800 Pure hypercholesterolemia, unspecified: Secondary | ICD-10-CM | POA: Diagnosis not present

## 2021-05-07 DIAGNOSIS — Z8673 Personal history of transient ischemic attack (TIA), and cerebral infarction without residual deficits: Secondary | ICD-10-CM | POA: Diagnosis not present

## 2021-05-07 DIAGNOSIS — R630 Anorexia: Secondary | ICD-10-CM | POA: Diagnosis not present

## 2021-05-07 DIAGNOSIS — N183 Chronic kidney disease, stage 3 unspecified: Secondary | ICD-10-CM | POA: Diagnosis not present

## 2021-05-07 DIAGNOSIS — Z0000 Encounter for general adult medical examination without abnormal findings: Secondary | ICD-10-CM | POA: Diagnosis not present

## 2021-05-07 DIAGNOSIS — Z8781 Personal history of (healed) traumatic fracture: Secondary | ICD-10-CM | POA: Diagnosis not present

## 2021-05-07 DIAGNOSIS — M810 Age-related osteoporosis without current pathological fracture: Secondary | ICD-10-CM | POA: Diagnosis not present

## 2021-05-07 DIAGNOSIS — I4891 Unspecified atrial fibrillation: Secondary | ICD-10-CM | POA: Diagnosis not present

## 2021-05-07 DIAGNOSIS — I10 Essential (primary) hypertension: Secondary | ICD-10-CM | POA: Diagnosis not present

## 2021-05-16 DIAGNOSIS — E7800 Pure hypercholesterolemia, unspecified: Secondary | ICD-10-CM | POA: Diagnosis not present

## 2021-05-16 DIAGNOSIS — I129 Hypertensive chronic kidney disease with stage 1 through stage 4 chronic kidney disease, or unspecified chronic kidney disease: Secondary | ICD-10-CM | POA: Diagnosis not present

## 2021-05-16 DIAGNOSIS — I69354 Hemiplegia and hemiparesis following cerebral infarction affecting left non-dominant side: Secondary | ICD-10-CM | POA: Diagnosis not present

## 2021-05-16 DIAGNOSIS — E876 Hypokalemia: Secondary | ICD-10-CM | POA: Diagnosis not present

## 2021-05-16 DIAGNOSIS — Z7983 Long term (current) use of bisphosphonates: Secondary | ICD-10-CM | POA: Diagnosis not present

## 2021-05-16 DIAGNOSIS — M810 Age-related osteoporosis without current pathological fracture: Secondary | ICD-10-CM | POA: Diagnosis not present

## 2021-05-16 DIAGNOSIS — Z741 Need for assistance with personal care: Secondary | ICD-10-CM | POA: Diagnosis not present

## 2021-05-16 DIAGNOSIS — I2510 Atherosclerotic heart disease of native coronary artery without angina pectoris: Secondary | ICD-10-CM | POA: Diagnosis not present

## 2021-05-16 DIAGNOSIS — I4891 Unspecified atrial fibrillation: Secondary | ICD-10-CM | POA: Diagnosis not present

## 2021-05-16 DIAGNOSIS — Z7901 Long term (current) use of anticoagulants: Secondary | ICD-10-CM | POA: Diagnosis not present

## 2021-05-16 DIAGNOSIS — Z9181 History of falling: Secondary | ICD-10-CM | POA: Diagnosis not present

## 2021-05-16 DIAGNOSIS — N390 Urinary tract infection, site not specified: Secondary | ICD-10-CM | POA: Diagnosis not present

## 2021-05-16 DIAGNOSIS — N183 Chronic kidney disease, stage 3 unspecified: Secondary | ICD-10-CM | POA: Diagnosis not present

## 2021-05-16 DIAGNOSIS — Z8781 Personal history of (healed) traumatic fracture: Secondary | ICD-10-CM | POA: Diagnosis not present

## 2021-05-22 DIAGNOSIS — I4891 Unspecified atrial fibrillation: Secondary | ICD-10-CM | POA: Diagnosis not present

## 2021-05-22 DIAGNOSIS — N183 Chronic kidney disease, stage 3 unspecified: Secondary | ICD-10-CM | POA: Diagnosis not present

## 2021-05-22 DIAGNOSIS — Z741 Need for assistance with personal care: Secondary | ICD-10-CM | POA: Diagnosis not present

## 2021-05-22 DIAGNOSIS — E7800 Pure hypercholesterolemia, unspecified: Secondary | ICD-10-CM | POA: Diagnosis not present

## 2021-05-22 DIAGNOSIS — M810 Age-related osteoporosis without current pathological fracture: Secondary | ICD-10-CM | POA: Diagnosis not present

## 2021-05-22 DIAGNOSIS — I129 Hypertensive chronic kidney disease with stage 1 through stage 4 chronic kidney disease, or unspecified chronic kidney disease: Secondary | ICD-10-CM | POA: Diagnosis not present

## 2021-05-22 DIAGNOSIS — I69354 Hemiplegia and hemiparesis following cerebral infarction affecting left non-dominant side: Secondary | ICD-10-CM | POA: Diagnosis not present

## 2021-05-22 DIAGNOSIS — Z9181 History of falling: Secondary | ICD-10-CM | POA: Diagnosis not present

## 2021-05-23 DIAGNOSIS — I129 Hypertensive chronic kidney disease with stage 1 through stage 4 chronic kidney disease, or unspecified chronic kidney disease: Secondary | ICD-10-CM | POA: Diagnosis not present

## 2021-05-23 DIAGNOSIS — M810 Age-related osteoporosis without current pathological fracture: Secondary | ICD-10-CM | POA: Diagnosis not present

## 2021-05-23 DIAGNOSIS — I69354 Hemiplegia and hemiparesis following cerebral infarction affecting left non-dominant side: Secondary | ICD-10-CM | POA: Diagnosis not present

## 2021-05-23 DIAGNOSIS — N183 Chronic kidney disease, stage 3 unspecified: Secondary | ICD-10-CM | POA: Diagnosis not present

## 2021-05-23 DIAGNOSIS — E7800 Pure hypercholesterolemia, unspecified: Secondary | ICD-10-CM | POA: Diagnosis not present

## 2021-05-23 DIAGNOSIS — I4891 Unspecified atrial fibrillation: Secondary | ICD-10-CM | POA: Diagnosis not present

## 2021-05-29 DIAGNOSIS — I4891 Unspecified atrial fibrillation: Secondary | ICD-10-CM | POA: Diagnosis not present

## 2021-05-29 DIAGNOSIS — I69354 Hemiplegia and hemiparesis following cerebral infarction affecting left non-dominant side: Secondary | ICD-10-CM | POA: Diagnosis not present

## 2021-05-29 DIAGNOSIS — M810 Age-related osteoporosis without current pathological fracture: Secondary | ICD-10-CM | POA: Diagnosis not present

## 2021-05-29 DIAGNOSIS — I10 Essential (primary) hypertension: Secondary | ICD-10-CM | POA: Diagnosis not present

## 2021-05-29 DIAGNOSIS — N183 Chronic kidney disease, stage 3 unspecified: Secondary | ICD-10-CM | POA: Diagnosis not present

## 2021-05-29 DIAGNOSIS — I129 Hypertensive chronic kidney disease with stage 1 through stage 4 chronic kidney disease, or unspecified chronic kidney disease: Secondary | ICD-10-CM | POA: Diagnosis not present

## 2021-05-29 DIAGNOSIS — E7800 Pure hypercholesterolemia, unspecified: Secondary | ICD-10-CM | POA: Diagnosis not present

## 2021-06-11 NOTE — Progress Notes (Signed)
Follow up visit  Subjective:   Catherine Anthony, female    DOB: 10/25/1930, 87 y.o.   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 Patient has had severe functional decline ever since her stroke in 2021. She needs help with all her ADL's. She has reduced her dietary intake and does not have the will to eat most things. She now weighs 95 lbs. Family is worried about this and want her to eat more. She has been on mirtazapine without much improvement in her appetite. Recent lab results performed by Dr. Shelia Media shows severe hypokalemia at 2.5.  Of note, patient is now DNR. She still lives at home. On specific questioning, there is clear caregiver burden.   Patient denies chest pain, shortness of breath. Blood pressure has been running low. There have been presyncopal episodes, particularly while on commode.    Current Outpatient Medications on File Prior to Visit  Medication Sig Dispense Refill   amiodarone (PACERONE) 200 MG tablet Take 1 tablet (200 mg total) by mouth as directed. Monday and Friday (Patient taking differently: Take 200 mg by mouth See admin instructions. Take 200 mg on Monday and Friday.) 8 tablet 0   apixaban (ELIQUIS) 2.5 MG TABS tablet TAKE (1) TABLET BY MOUTH TWICE DAILY. (Patient taking differently: Take 2.5 mg by mouth 2 (two) times daily. ) 60 tablet 0   Cholecalciferol (VITAMIN D3) 50 MCG (2000 UT) TABS Take 2 tablets by mouth daily.      feeding supplement, ENSURE ENLIVE, (ENSURE ENLIVE) LIQD Take 237 mLs by mouth 2 (two) times daily between meals. 237 mL 12   metoprolol tartrate (LOPRESSOR) 25 MG tablet Take 0.5 tablets (12.5 mg total) by mouth 2 (two) times  daily. TAKE (1) TABLET BY MOUTH TWICE DAILY. 30 tablet 0   mirtazapine (REMERON) 15 MG tablet TAKE 1/2 TABLET BY MOUTH AT BEDTIME. 15 tablet 0   nitroGLYCERIN (NITROSTAT) 0.4 MG SL tablet Place 1 tablet (0.4 mg total) under the tongue every 5 (five) minutes x 3 doses as needed for chest pain. 25 tablet 0   rosuvastatin (CRESTOR) 20 MG tablet TAKE (1) TABLET BY MOUTH ONCE DAILY. (Patient taking differently: Take 20 mg by mouth daily. ) 30 tablet 0   saccharomyces boulardii (FLORASTOR) 250 MG capsule Take 1 capsule (250 mg total) by mouth 2 (two) times daily. 5 capsule 0   Tetrahydrozoline-Zn Sulfate (EYE DROPS ALLERGY RELIEF OP) Place 1 drop into both eyes 3 (three) times daily as needed (allergy).      No current facility-administered medications on file prior to visit.    Cardiovascular studies:  EKG 06/10/2020: Sinus rhythm 60 bpm  Low voltage in limb leads  Nonspecific T-abnormality  Echocardiogram 06/05/2020:  Normal LV systolic function with visual EF 65-70%. Left ventricle cavity  is normal in size. Normal global wall motion. Doppler evidence of grade II  diastolic dysfunction, elevated LAP. Calculated EF 58%.  Left atrial cavity is severely dilated.  Right atrial cavity visually appears moderately dilated.  Aortic bioprosthesis (23 mm Edwards Sapien TAVR) well seated, without  evidence of dehiscence, or perivalvular regurgitation. No valvular  regurgitation.  Posterior mitral valve leaflet is calcified with normal leaflet excursion.  No evidence of mitral stenosis. Moderate (Grade III) mitral regurgitation.  Moderate to severe tricuspid regurgitation. Mild pulmonary hypertension.  RVSP measures 35 mmHg.  Mild pulmonic regurgitation.  IVC is dilated with a respiratory response of >50%.  Compared to prior study dated 10/02/2019: Grade II DD is new otherwise no  significant change.   Lower Extremity Venous Duplex  Right 12/14/2019:  No evidence of deep vein thrombosis of the  right lower extremity with normal venous return.  R/LHC 05/16/2019: LM: Normal LAD: Patent prox-mid LAD overlapping stents. Synergy DES 3.0 x 24 mm, Synergy DES 3.5 X 8 mm. Placed 08/2017          No ISR. No other significant disease. LCx: Patent prox-mid LCx overlapping stents. Synergy DES 2.5 x 12 mm, Synergy DES 2.5 x 20 mm, Synergy DES 3.0 X 20 mm. Placed 08/2017          No ISR. No other significant disease. RCA: Patent prox RCA stent. Successful PTCA and stent placement Resolute Onyx 3.5 X 26 mm DES.  Placed 08/2017           Minimal 10% late lumen loss. No significant ISR. No other significant disease No obstructive CAD   Severe aortic valve stenosis (Simultaneous LV-Ao hemodynamic assessment) Mean PG 39 mmHg, AVA 0.6 cm2, AVAi 0.4 cm2/m2   Recommendation: TAVR workup  24-hour Holter monitor 10/03/2019: Sinus rhythm.  Heart rate 67-129 bpm.  Average heart rate 88 bpm. 30 % ventricular ectopy in the form of PVCs, bigeminy, trigeminy, and nonsustained ventricular tachycardia up to 7 beats.  Ventricular ectopy is predominantly unifocal. 8% supraventricular ectopy in the form of atrial couplets, bigeminy, trigeminy. No atrial flutter, atrial fibrillation, ventricular tachycardia. No sinus pauses > 3 seconds, no high-grade AV block.   TAVR 06/06/2019: 23 mm Edwards Sapien bioprosthetic aortic valve (Dr. Burt Knack)  LE [place] 06/06/2019: Right: Patent common femoral, superficial femoral, and popliteal arteries. Monophasic waveforms are detected distal to the tibioperoneal trunk.  Carotid artery duplex 12/28/2017: Minimal stenosis in the right internal carotid artery (1-15%). Left vertebral artery waveform is biphasic and suggests proximal left subclavian artery stenosis.  Antegrade right vertebral artery flow. Further studies if clinically indicated.  Recent labs: 04/30/2021: Glucose 98, BUN/Cr 9/1.04. EGFR 48. Na/K 144/2.5. H/H 36.3/11.8. MCV 94. Platelets 217  2021: Glucose 104. BUN/Cr  21/1.5. eGFR 34.  HbA1C 5.0% Chol 211, TG 75, HDL 46, LDL 67  08/01/2019: Chol 211, TG 119, HDL 46, LDL 144  08/15/2018: Glucose 87. BUN/Cr 18/1.05. eGFR 48. Na/K 139/4.5  01/24/2018: Cholesterol 143, triglycerides 71, HDL 53, LDL 76. Glucose 84.  BUN/creatinine 22/0.9.  EGFR 67, sodium 137, potassium 4.3 H/H 13.2/39.9.  MCV 95.  Platelets 264   Review of Systems  Constitutional: Positive for weight loss.  Cardiovascular:  Negative for chest pain, dyspnea on exertion, leg swelling, palpitations and syncope.  Neurological:        Left sided weakness      Vitals:   06/12/21 1233  BP: (!) 113/58  Pulse: (!) 58  Resp: 18  Temp: 97.7 F (36.5 C)  SpO2: 97%    No data found.    Body mass index is 16.35 kg/m. There were no vitals filed for this visit.    Objective:   Physical Exam Vitals and nursing note reviewed.  Constitutional:      General: She is not in acute distress.    Appearance: She is cachectic.  Neck:     Vascular: No JVD.  Cardiovascular:  Rate and Rhythm: Normal rate and regular rhythm.     Heart sounds: Normal heart sounds. No murmur heard. Pulmonary:     Effort: Pulmonary effort is normal.     Breath sounds: Normal breath sounds. No wheezing or rales.  Musculoskeletal:     Right lower leg: No edema.     Left lower leg: No edema.  Neurological:     Motor: Weakness (Left hemiparesis) present.          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   Goals of care: All cardiac comorbidities are stable. Her biggest issue currently is frailty. Loss of appetite and weight is likely irreversible. Caregiver burden is significant. I think she will benefit from home with hospice care.   I had a long discussion with patient, Tessie Fass and [name]. All are in agreement with this.  To avoid hypotension and  minimize her meds, I have stopped metoprolol and amiodarone. Okay to continue eliquis in absence of any bleeding issues.   I will see her on as needed basis.   Nigel Mormon, MD [facility] Pager: [phone] Office: [phone] If no answer Cell [phone]

## 2021-06-12 ENCOUNTER — Other Ambulatory Visit: Payer: Self-pay

## 2021-06-12 ENCOUNTER — Encounter: Payer: Self-pay | Admitting: Cardiology

## 2021-06-12 ENCOUNTER — Ambulatory Visit: Payer: Medicare Other | Admitting: Cardiology

## 2021-06-12 VITALS — BP 113/58 | HR 58 | Temp 97.7°F | Resp 18 | Ht 64.0 in

## 2021-06-12 DIAGNOSIS — I639 Cerebral infarction, unspecified: Secondary | ICD-10-CM | POA: Diagnosis not present

## 2021-06-12 DIAGNOSIS — Z515 Encounter for palliative care: Secondary | ICD-10-CM | POA: Insufficient documentation

## 2021-06-12 DIAGNOSIS — Z7189 Other specified counseling: Secondary | ICD-10-CM | POA: Diagnosis not present

## 2021-06-12 DIAGNOSIS — I480 Paroxysmal atrial fibrillation: Secondary | ICD-10-CM | POA: Diagnosis not present

## 2021-06-13 DIAGNOSIS — I69354 Hemiplegia and hemiparesis following cerebral infarction affecting left non-dominant side: Secondary | ICD-10-CM | POA: Diagnosis not present

## 2021-06-13 DIAGNOSIS — I129 Hypertensive chronic kidney disease with stage 1 through stage 4 chronic kidney disease, or unspecified chronic kidney disease: Secondary | ICD-10-CM | POA: Diagnosis not present

## 2021-06-13 DIAGNOSIS — N183 Chronic kidney disease, stage 3 unspecified: Secondary | ICD-10-CM | POA: Diagnosis not present

## 2021-06-13 DIAGNOSIS — M810 Age-related osteoporosis without current pathological fracture: Secondary | ICD-10-CM | POA: Diagnosis not present

## 2021-06-13 DIAGNOSIS — E7800 Pure hypercholesterolemia, unspecified: Secondary | ICD-10-CM | POA: Diagnosis not present

## 2021-06-13 DIAGNOSIS — I4891 Unspecified atrial fibrillation: Secondary | ICD-10-CM | POA: Diagnosis not present

## 2021-06-17 ENCOUNTER — Other Ambulatory Visit: Payer: Self-pay

## 2021-06-17 ENCOUNTER — Inpatient Hospital Stay (HOSPITAL_COMMUNITY)
Admission: EM | Admit: 2021-06-17 | Discharge: 2021-06-19 | DRG: 871 | Disposition: A | Discharge status: Home / Self Care | Payer: Medicare Other | Attending: Internal Medicine | Admitting: Internal Medicine

## 2021-06-17 ENCOUNTER — Emergency Department (HOSPITAL_COMMUNITY): Payer: Medicare Other

## 2021-06-17 ENCOUNTER — Encounter (HOSPITAL_COMMUNITY): Payer: Self-pay

## 2021-06-17 DIAGNOSIS — R6520 Severe sepsis without septic shock: Secondary | ICD-10-CM | POA: Diagnosis present

## 2021-06-17 DIAGNOSIS — N390 Urinary tract infection, site not specified: Secondary | ICD-10-CM | POA: Diagnosis present

## 2021-06-17 DIAGNOSIS — I110 Hypertensive heart disease with heart failure: Secondary | ICD-10-CM | POA: Diagnosis present

## 2021-06-17 DIAGNOSIS — Z800 Family history of malignant neoplasm of digestive organs: Secondary | ICD-10-CM | POA: Diagnosis not present

## 2021-06-17 DIAGNOSIS — Z87440 Personal history of urinary (tract) infections: Secondary | ICD-10-CM

## 2021-06-17 DIAGNOSIS — Z9841 Cataract extraction status, right eye: Secondary | ICD-10-CM

## 2021-06-17 DIAGNOSIS — Z955 Presence of coronary angioplasty implant and graft: Secondary | ICD-10-CM

## 2021-06-17 DIAGNOSIS — Z66 Do not resuscitate: Secondary | ICD-10-CM | POA: Diagnosis present

## 2021-06-17 DIAGNOSIS — I10 Essential (primary) hypertension: Secondary | ICD-10-CM | POA: Diagnosis not present

## 2021-06-17 DIAGNOSIS — I9589 Other hypotension: Secondary | ICD-10-CM

## 2021-06-17 DIAGNOSIS — E43 Unspecified severe protein-calorie malnutrition: Secondary | ICD-10-CM | POA: Diagnosis present

## 2021-06-17 DIAGNOSIS — I491 Atrial premature depolarization: Secondary | ICD-10-CM | POA: Diagnosis not present

## 2021-06-17 DIAGNOSIS — I482 Chronic atrial fibrillation, unspecified: Secondary | ICD-10-CM | POA: Diagnosis present

## 2021-06-17 DIAGNOSIS — Z87442 Personal history of urinary calculi: Secondary | ICD-10-CM

## 2021-06-17 DIAGNOSIS — R0689 Other abnormalities of breathing: Secondary | ICD-10-CM | POA: Diagnosis not present

## 2021-06-17 DIAGNOSIS — Z961 Presence of intraocular lens: Secondary | ICD-10-CM | POA: Diagnosis present

## 2021-06-17 DIAGNOSIS — E860 Dehydration: Secondary | ICD-10-CM | POA: Diagnosis present

## 2021-06-17 DIAGNOSIS — I2510 Atherosclerotic heart disease of native coronary artery without angina pectoris: Secondary | ICD-10-CM | POA: Diagnosis present

## 2021-06-17 DIAGNOSIS — I499 Cardiac arrhythmia, unspecified: Secondary | ICD-10-CM | POA: Diagnosis not present

## 2021-06-17 DIAGNOSIS — Z8249 Family history of ischemic heart disease and other diseases of the circulatory system: Secondary | ICD-10-CM | POA: Diagnosis not present

## 2021-06-17 DIAGNOSIS — Z681 Body mass index (BMI) 19 or less, adult: Secondary | ICD-10-CM

## 2021-06-17 DIAGNOSIS — E872 Acidosis, unspecified: Secondary | ICD-10-CM | POA: Diagnosis present

## 2021-06-17 DIAGNOSIS — Z7401 Bed confinement status: Secondary | ICD-10-CM | POA: Diagnosis not present

## 2021-06-17 DIAGNOSIS — I5032 Chronic diastolic (congestive) heart failure: Secondary | ICD-10-CM | POA: Diagnosis present

## 2021-06-17 DIAGNOSIS — Z9842 Cataract extraction status, left eye: Secondary | ICD-10-CM

## 2021-06-17 DIAGNOSIS — I69354 Hemiplegia and hemiparesis following cerebral infarction affecting left non-dominant side: Secondary | ICD-10-CM

## 2021-06-17 DIAGNOSIS — Z885 Allergy status to narcotic agent status: Secondary | ICD-10-CM

## 2021-06-17 DIAGNOSIS — R627 Adult failure to thrive: Principal | ICD-10-CM | POA: Diagnosis present

## 2021-06-17 DIAGNOSIS — E861 Hypovolemia: Secondary | ICD-10-CM

## 2021-06-17 DIAGNOSIS — R000 Tachycardia, unspecified: Secondary | ICD-10-CM | POA: Diagnosis not present

## 2021-06-17 DIAGNOSIS — R531 Weakness: Secondary | ICD-10-CM | POA: Diagnosis not present

## 2021-06-17 DIAGNOSIS — Z7901 Long term (current) use of anticoagulants: Secondary | ICD-10-CM

## 2021-06-17 DIAGNOSIS — A4151 Sepsis due to Escherichia coli [E. coli]: Principal | ICD-10-CM | POA: Diagnosis present

## 2021-06-17 DIAGNOSIS — E782 Mixed hyperlipidemia: Secondary | ICD-10-CM | POA: Diagnosis present

## 2021-06-17 DIAGNOSIS — E876 Hypokalemia: Secondary | ICD-10-CM | POA: Diagnosis present

## 2021-06-17 DIAGNOSIS — Z952 Presence of prosthetic heart valve: Secondary | ICD-10-CM | POA: Diagnosis not present

## 2021-06-17 DIAGNOSIS — Z20822 Contact with and (suspected) exposure to covid-19: Secondary | ICD-10-CM | POA: Diagnosis present

## 2021-06-17 DIAGNOSIS — R110 Nausea: Secondary | ICD-10-CM | POA: Diagnosis not present

## 2021-06-17 DIAGNOSIS — Z7189 Other specified counseling: Secondary | ICD-10-CM | POA: Diagnosis not present

## 2021-06-17 DIAGNOSIS — R54 Age-related physical debility: Secondary | ICD-10-CM | POA: Diagnosis not present

## 2021-06-17 DIAGNOSIS — R1110 Vomiting, unspecified: Secondary | ICD-10-CM | POA: Diagnosis not present

## 2021-06-17 DIAGNOSIS — Z8673 Personal history of transient ischemic attack (TIA), and cerebral infarction without residual deficits: Secondary | ICD-10-CM | POA: Diagnosis not present

## 2021-06-17 DIAGNOSIS — Z515 Encounter for palliative care: Secondary | ICD-10-CM | POA: Diagnosis not present

## 2021-06-17 DIAGNOSIS — E785 Hyperlipidemia, unspecified: Secondary | ICD-10-CM | POA: Diagnosis present

## 2021-06-17 DIAGNOSIS — R7989 Other specified abnormal findings of blood chemistry: Secondary | ICD-10-CM | POA: Diagnosis present

## 2021-06-17 DIAGNOSIS — E161 Other hypoglycemia: Secondary | ICD-10-CM | POA: Diagnosis not present

## 2021-06-17 DIAGNOSIS — N179 Acute kidney failure, unspecified: Secondary | ICD-10-CM | POA: Diagnosis not present

## 2021-06-17 DIAGNOSIS — E162 Hypoglycemia, unspecified: Secondary | ICD-10-CM | POA: Diagnosis not present

## 2021-06-17 DIAGNOSIS — J439 Emphysema, unspecified: Secondary | ICD-10-CM | POA: Diagnosis not present

## 2021-06-17 LAB — BASIC METABOLIC PANEL
Anion gap: 17 — ABNORMAL HIGH (ref 5–15)
BUN: 21 mg/dL (ref 8–23)
CO2: 18 mmol/L — ABNORMAL LOW (ref 22–32)
Calcium: 8.1 mg/dL — ABNORMAL LOW (ref 8.9–10.3)
Chloride: 108 mmol/L (ref 98–111)
Creatinine, Ser: 1.04 mg/dL — ABNORMAL HIGH (ref 0.44–1.00)
GFR, Estimated: 52 mL/min — ABNORMAL LOW (ref >60)
Glucose, Bld: 96 mg/dL (ref 70–99)
Potassium: 3.7 mmol/L (ref 3.5–5.1)
Sodium: 143 mmol/L (ref 135–145)

## 2021-06-17 LAB — URINALYSIS, ROUTINE W REFLEX MICROSCOPIC
Bilirubin Urine: NEGATIVE
Glucose, UA: NEGATIVE mg/dL
Ketones, ur: 40 mg/dL — AB
Nitrite: NEGATIVE
Protein, ur: 100 mg/dL — AB
Specific Gravity, Urine: >1.03 — ABNORMAL HIGH (ref 1.005–1.030)
pH: 6 (ref 5.0–8.0)

## 2021-06-17 LAB — COMPREHENSIVE METABOLIC PANEL
ALT: 16 U/L (ref 0–44)
AST: 30 U/L (ref 15–41)
Albumin: 2.8 g/dL — ABNORMAL LOW (ref 3.5–5.0)
Alkaline Phosphatase: 83 U/L (ref 38–126)
Anion gap: 17 — ABNORMAL HIGH (ref 5–15)
BUN: 20 mg/dL (ref 8–23)
CO2: 14 mmol/L — ABNORMAL LOW (ref 22–32)
Calcium: 8.7 mg/dL — ABNORMAL LOW (ref 8.9–10.3)
Chloride: 104 mmol/L (ref 98–111)
Creatinine, Ser: 1.1 mg/dL — ABNORMAL HIGH (ref 0.44–1.00)
GFR, Estimated: 48 mL/min — ABNORMAL LOW (ref >60)
Glucose, Bld: 75 mg/dL (ref 70–99)
Potassium: 3.2 mmol/L — ABNORMAL LOW (ref 3.5–5.1)
Sodium: 135 mmol/L (ref 135–145)
Total Bilirubin: 1.2 mg/dL (ref 0.3–1.2)
Total Protein: 5.7 g/dL — ABNORMAL LOW (ref 6.5–8.1)

## 2021-06-17 LAB — CBC WITH DIFFERENTIAL/PLATELET
Abs Immature Granulocytes: 0.06 10*3/uL (ref 0.00–0.07)
Basophils Absolute: 0.1 10*3/uL (ref 0.0–0.1)
Basophils Relative: 1 %
Eosinophils Absolute: 0 10*3/uL (ref 0.0–0.5)
Eosinophils Relative: 0 %
HCT: 37.2 % (ref 36.0–46.0)
Hemoglobin: 11.5 g/dL — ABNORMAL LOW (ref 12.0–15.0)
Immature Granulocytes: 1 %
Lymphocytes Relative: 20 %
Lymphs Abs: 2.2 10*3/uL (ref 0.7–4.0)
MCH: 31.3 pg (ref 26.0–34.0)
MCHC: 30.9 g/dL (ref 30.0–36.0)
MCV: 101.4 fL — ABNORMAL HIGH (ref 80.0–100.0)
Monocytes Absolute: 0.4 10*3/uL (ref 0.1–1.0)
Monocytes Relative: 4 %
Neutro Abs: 8.2 10*3/uL — ABNORMAL HIGH (ref 1.7–7.7)
Neutrophils Relative %: 74 %
Platelets: 206 10*3/uL (ref 150–400)
RBC: 3.67 MIL/uL — ABNORMAL LOW (ref 3.87–5.11)
RDW: 14.6 % (ref 11.5–15.5)
WBC: 10.9 10*3/uL — ABNORMAL HIGH (ref 4.0–10.5)
nRBC: 0 % (ref 0.0–0.2)

## 2021-06-17 LAB — LACTIC ACID, PLASMA
Lactic Acid, Venous: 4.1 mmol/L (ref 0.5–1.9)
Lactic Acid, Venous: 1.3 mmol/L (ref 0.5–1.9)

## 2021-06-17 LAB — PROTIME-INR
INR: 1.3 — ABNORMAL HIGH (ref 0.8–1.2)
Prothrombin Time: 16.1 seconds — ABNORMAL HIGH (ref 11.4–15.2)

## 2021-06-17 LAB — URINALYSIS, MICROSCOPIC (REFLEX)
RBC / HPF: >50 RBC/hpf (ref 0–5)
Squamous Epithelial / HPF: NONE SEEN (ref 0–5)
WBC, UA: >50 WBC/hpf (ref 0–5)

## 2021-06-17 LAB — RESP PANEL BY RT-PCR (FLU A&B, COVID) ARPGX2
Influenza A by PCR: NEGATIVE
Influenza B by PCR: NEGATIVE
SARS Coronavirus 2 by RT PCR: NEGATIVE

## 2021-06-17 LAB — APTT: aPTT: 35 seconds (ref 24–36)

## 2021-06-17 MED ORDER — VITAMIN D 25 MCG (1000 UNIT) PO TABS
4000.0000 [IU] | ORAL_TABLET | Freq: Every day | ORAL | Status: DC
  Administered 2021-06-18: 1000 [IU] via ORAL
  Administered 2021-06-19: 4000 [IU] via ORAL
  Filled 2021-06-17 (×2): qty 4

## 2021-06-17 MED ORDER — LACTATED RINGERS IV BOLUS (SEPSIS)
1000.0000 mL | Freq: Once | INTRAVENOUS | Status: AC
  Administered 2021-06-17: 1000 mL via INTRAVENOUS

## 2021-06-17 MED ORDER — LACTATED RINGERS IV BOLUS (SEPSIS)
250.0000 mL | Freq: Once | INTRAVENOUS | Status: AC
  Administered 2021-06-17: 250 mL via INTRAVENOUS

## 2021-06-17 MED ORDER — ENSURE ENLIVE PO LIQD
237.0000 mL | Freq: Two times a day (BID) | ORAL | Status: DC
  Administered 2021-06-18 – 2021-06-19 (×2): 237 mL via ORAL
  Filled 2021-06-17 (×2): qty 237

## 2021-06-17 MED ORDER — APIXABAN 2.5 MG PO TABS
2.5000 mg | ORAL_TABLET | Freq: Two times a day (BID) | ORAL | Status: DC
  Administered 2021-06-18 – 2021-06-19 (×3): 2.5 mg via ORAL
  Filled 2021-06-17 (×5): qty 1

## 2021-06-17 MED ORDER — ASPIRIN EC 81 MG PO TBEC
81.0000 mg | DELAYED_RELEASE_TABLET | Freq: Every day | ORAL | Status: DC
  Administered 2021-06-18 – 2021-06-19 (×2): 81 mg via ORAL
  Filled 2021-06-17 (×2): qty 1

## 2021-06-17 MED ORDER — SODIUM CHLORIDE 0.9 % IV SOLN
2.0000 g | Freq: Once | INTRAVENOUS | Status: AC
  Administered 2021-06-17: 2 g via INTRAVENOUS
  Filled 2021-06-17: qty 2

## 2021-06-17 MED ORDER — SODIUM BICARBONATE 8.4 % IV SOLN
INTRAVENOUS | Status: AC
  Filled 2021-06-17: qty 150

## 2021-06-17 MED ORDER — POTASSIUM CHLORIDE CRYS ER 10 MEQ PO TBCR
10.0000 meq | EXTENDED_RELEASE_TABLET | Freq: Two times a day (BID) | ORAL | Status: DC
  Administered 2021-06-18 – 2021-06-19 (×3): 10 meq via ORAL
  Filled 2021-06-17 (×4): qty 1

## 2021-06-17 MED ORDER — SODIUM CHLORIDE 0.9 % IV BOLUS
500.0000 mL | Freq: Once | INTRAVENOUS | Status: AC
  Administered 2021-06-17: 500 mL via INTRAVENOUS

## 2021-06-17 MED ORDER — METRONIDAZOLE 500 MG/100ML IV SOLN
500.0000 mg | Freq: Once | INTRAVENOUS | Status: AC
  Administered 2021-06-17: 500 mg via INTRAVENOUS
  Filled 2021-06-17: qty 100

## 2021-06-17 MED ORDER — NITROGLYCERIN 0.4 MG SL SUBL: 0.4000 mg | SUBLINGUAL_TABLET | SUBLINGUAL | Status: DC | PRN

## 2021-06-17 MED ORDER — SODIUM BICARBONATE 8.4 % IV SOLN: 100.0000 meq | Freq: Once | INTRAVENOUS | Status: AC

## 2021-06-17 MED ORDER — ADULT MULTIVITAMIN W/MINERALS CH
1.0000 | ORAL_TABLET | Freq: Every day | ORAL | Status: DC
  Administered 2021-06-19: 1 via ORAL
  Filled 2021-06-17 (×2): qty 1

## 2021-06-17 MED ORDER — SACCHAROMYCES BOULARDII 250 MG PO CAPS
250.0000 mg | ORAL_CAPSULE | Freq: Two times a day (BID) | ORAL | Status: DC
  Administered 2021-06-18 – 2021-06-19 (×2): 250 mg via ORAL
  Filled 2021-06-17 (×4): qty 1

## 2021-06-17 MED ORDER — VITAMIN B-12 1000 MCG PO TABS
1000.0000 ug | ORAL_TABLET | Freq: Every day | ORAL | Status: DC
  Administered 2021-06-19: 1000 ug via ORAL
  Filled 2021-06-17 (×2): qty 1

## 2021-06-17 MED ORDER — SODIUM BICARBONATE 8.4 % IV SOLN
INTRAVENOUS | Status: AC
  Administered 2021-06-17: 100 meq via INTRAVENOUS
  Filled 2021-06-17: qty 50

## 2021-06-17 MED ORDER — ACETAMINOPHEN 650 MG RE SUPP: 650.0000 mg | Freq: Four times a day (QID) | RECTAL | Status: DC | PRN

## 2021-06-17 MED ORDER — ROSUVASTATIN CALCIUM 20 MG PO TABS
20.0000 mg | ORAL_TABLET | Freq: Every day | ORAL | Status: DC
  Administered 2021-06-18 – 2021-06-19 (×2): 20 mg via ORAL
  Filled 2021-06-17 (×2): qty 1

## 2021-06-17 MED ORDER — LACTATED RINGERS IV BOLUS (SEPSIS)
250.0000 mL | Freq: Once | INTRAVENOUS | Status: AC
Start: 2021-06-17 — End: 2021-06-18
  Administered 2021-06-18: 250 mL via INTRAVENOUS

## 2021-06-17 MED ORDER — VANCOMYCIN HCL IN DEXTROSE 1-5 GM/200ML-% IV SOLN
1000.0000 mg | Freq: Once | INTRAVENOUS | Status: AC
  Administered 2021-06-17: 1000 mg via INTRAVENOUS
  Filled 2021-06-17: qty 200

## 2021-06-17 MED ORDER — SODIUM BICARBONATE 8.4 % IV SOLN
INTRAVENOUS | Status: DC
  Filled 2021-06-17 (×5): qty 1000

## 2021-06-17 MED ORDER — PROCHLORPERAZINE EDISYLATE 10 MG/2ML IJ SOLN
10.0000 mg | Freq: Four times a day (QID) | INTRAMUSCULAR | Status: DC | PRN
  Administered 2021-06-17 – 2021-06-18 (×4): 10 mg via INTRAVENOUS
  Filled 2021-06-17 (×4): qty 2

## 2021-06-17 MED ORDER — SODIUM CHLORIDE 0.9 % IV SOLN: 2.0000 g | INTRAVENOUS | Status: DC

## 2021-06-17 MED ORDER — VANCOMYCIN HCL 500 MG/100ML IV SOLN: 500.0000 mg | INTRAVENOUS | Status: DC

## 2021-06-17 MED ORDER — ACETAMINOPHEN 325 MG PO TABS: 650.0000 mg | ORAL_TABLET | Freq: Four times a day (QID) | ORAL | Status: DC | PRN

## 2021-06-17 MED ORDER — POTASSIUM CHLORIDE 10 MEQ/100ML IV SOLN
10.0000 meq | Freq: Once | INTRAVENOUS | Status: AC
  Administered 2021-06-17: 10 meq via INTRAVENOUS
  Filled 2021-06-17: qty 100

## 2021-06-17 MED ORDER — SODIUM CHLORIDE 0.9 % IV SOLN
1.0000 g | INTRAVENOUS | Status: DC
  Administered 2021-06-18 – 2021-06-19 (×2): 1 g via INTRAVENOUS
  Filled 2021-06-17 (×2): qty 10

## 2021-06-17 MED ORDER — MIRTAZAPINE 15 MG PO TABS
7.5000 mg | ORAL_TABLET | Freq: Every day | ORAL | Status: DC
  Administered 2021-06-18: 7.5 mg via ORAL
  Filled 2021-06-17 (×2): qty 1

## 2021-06-17 NOTE — ED Notes (Addendum)
Bladder scanned pt. Pt. Has 85 mls. In and out cath pt. With 10 ml of urine return. Pts. Abdomen is not distended. This nurse sent a message to Dr. Marsa Aris to verify that they want a catheter placed in this pt.

## 2021-06-17 NOTE — ED Notes (Addendum)
Lactic was re-drawn by lab. Charge nurse Wilkie Aye called lab to ask if the lactic had been ran due to nothing showing in progress. Lab stated it hadn't been ran yet again. Wilkie Aye notified this nurse.

## 2021-06-17 NOTE — ED Notes (Addendum)
This nurse called lab to verify if they had pts. 2nd lactic. Per lab the 2nd lactic was collected but not ran. Lab stated they would come and re-collect. Lab recollected at 2142.

## 2021-06-17 NOTE — ED Notes (Signed)
Bladder scanned pt. Pt. Has 75 mls of urine.

## 2021-06-17 NOTE — ED Notes (Signed)
Attempted in and out cath on pt. Without success.

## 2021-06-17 NOTE — Progress Notes (Signed)
Pharmacy Antibiotic Note  Catherine Anthony is a 87 y.o. female admitted on 06/17/2021 with  unknown source of infection .  Pharmacy has been consulted for Vancomycin and Cefepime dosing.  Plan: Vancomycin 1000 mg IV x 1 dose. Vancomycin 500 mg IV every 48 hours. Cefepime 2000 mg IV every 24 hours. Monitor labs, c/s, and vanco level as indicated.  Height: 5\' 4"  (162.6 cm) Weight: 37.5 kg (82 lb 9.6 oz) IBW/kg (Calculated) : 54.7  Temp (24hrs), Avg:97.1 F (36.2 C), Min:96.6 F (35.9 C), Max:97.6 F (36.4 C)  Recent Labs  Lab 06/17/21 1135  WBC 10.9*  CREATININE 1.10*  LATICACIDVEN 4.1*    Estimated Creatinine Clearance: 20.9 mL/min (A) (by C-G formula based on SCr of 1.1 mg/dL (H)).    Allergies  Allergen Reactions   Codeine Nausea And Vomiting    Antimicrobials this admission: Vanco 7/19 >>  Cefepime 7/19 >>  Flagyl 7/19   Microbiology results: 7/19 BCx: pending 7/19 UCx: pending    Thank you for allowing pharmacy to be a part of this patient's care.  8/19 06/17/2021 4:19 PM

## 2021-06-17 NOTE — ED Triage Notes (Signed)
Pt brought to ED via [facility] for nausea and vomiting since Saturday night. Pt has been treated twice for UTI since June 28. Pt tachycardic, tachypneic. Given Zofran 4 mg PTA, NS 500 ml bolus. Pt not wanting to eat or drink.

## 2021-06-17 NOTE — ED Provider Notes (Signed)
[facility] EMERGENCY DEPARTMENT Provider Note   CSN: [id number] Arrival date & time: 06/17/21  1117     History Chief Complaint  Patient presents with   Emesis    Catherine Anthony is a 87 y.o. female.   Emesis      Catherine Anthony is a 87 y.o. female with past medical history of coronary artery disease, hypertension, paroxysmal atrial fibrillation, status post transcatheter aortic valve replacement and history of CVA with left-sided hemiparesis.  She is anticoagulated on Eliquis.  she presents to the Emergency Department from home accompanied by her daughter who is also her caregiver.  Daughter is requesting evaluation for possible urinary tract infection and dehydration.  Daughter states that since early June her mother was diagnosed with a urinary tract infection and prescribed an unknown antibiotic.  She finished the medication but continued to have symptoms of dysuria.  Daughter states that her primary care provider called another antibiotic and and she finished that as well.  Daughter is concerned that she continues to have urinary tract infection.  She states since the weekend, she has a progressing decrease in appetite and is not eating, drinking or taking her daily medications.  Took only one dose of her Eliquis yesterday.  Daughter states that her urine continues to have a foul odor and appears cloudy.  She denies diarrhea, abdominal pain, chest pain or shortness of breath, fever or vomiting.  [place]  Patient has D[place].    Past Medical History:  Diagnosis Date   Arthritis    Right hand   Complication of anesthesia    Coronary artery disease    s/p mutliple PCIs   Dyslipidemia    Family hx of colon cancer    History of kidney stones    Hypertension    PAF (paroxysmal atrial fibrillation) (HCC)    on Eliquis   S/P TAVR (transcatheter aortic valve replacement)    Severe aortic stenosis     Patient Active Problem List   Diagnosis Date Noted    End of life care 06/12/2021   Protein-calorie malnutrition, severe 08/15/2020   Severe sepsis (HCC) 08/15/2020   Acute gastroenteritis 08/15/2020   Nausea & vomiting 08/13/2020   Lactic acidosis 08/13/2020   Hyperglycemia 08/13/2020   Dehydration 08/13/2020   AKI (acute kidney injury) (HCC) 08/13/2020   Elevated troponin 08/13/2020   Prolonged QT interval 08/13/2020   Diarrhea 08/13/2020   Open wound of left heel 08/13/2020   Hypotension 08/13/2020   Hypothermia 08/13/2020   Generalized weakness 08/12/2020   Ischemic stroke (HCC) 07/25/2020   Left-sided weakness 07/24/2020   Acute left-sided weakness    Hypokalemia    Leg edema, left 12/11/2019   Frequent unifocal PVCs 08/09/2019   Indication present for endocarditis prophylaxis 08/09/2019   Mixed hyperlipidemia 08/01/2019   Acute on chronic diastolic heart failure (HCC) 06/06/2019   S/P TAVR (transcatheter aortic valve replacement) 06/06/2019   Essential hypertension    High cholesterol    Coronary artery disease    PAF (paroxysmal atrial fibrillation) (HCC)    Moderate aortic regurgitation    Coronary artery disease with stable angina pectoris (HCC) 08/31/2017    Past Surgical History:  Procedure Laterality Date   CATARACT EXTRACTION W/ INTRAOCULAR LENS IMPLANT Right 06/04/2011   [facility]   CATARACT EXTRACTION W/PHACO  06/22/2011   Procedure: CATARACT EXTRACTION PHACO AND INTRAOCULAR LENS PLACEMENT (IOC);  Surgeon: Gemma PayorKerry Hunt;  Location: [facility]S;  Service: Ophthalmology;  Laterality: Left;  CDE 14.34   COLONOSCOPY  06/23/08   COLONOSCOPY N/A 01/18/2014   Procedure: COLONOSCOPY;  Surgeon: Malissa Hippo, MD;  Location: [facility] ENDO SUITE;  Service: Endoscopy;  Laterality: N/A;  145   CORONARY ANGIOPLASTY WITH STENT PLACEMENT     CORONARY ATHERECTOMY N/A 09/21/2017   Procedure: CORONARY ATHERECTOMY;  Surgeon: Elder Negus, MD;  Location: [facility] INVASIVE CV LAB;  Service: Cardiovascular;  Laterality: N/A;   CORONARY STENT  INTERVENTION N/A 08/31/2017   Procedure: CORONARY STENT INTERVENTION;  Surgeon: Elder Negus, MD;  Location: [facility] INVASIVE CV LAB;  Service: Cardiovascular;  Laterality: N/A;   CORONARY STENT INTERVENTION N/A 09/21/2017   Procedure: CORONARY STENT INTERVENTION;  Surgeon: Elder Negus, MD;  Location: [facility] INVASIVE CV LAB;  Service: Cardiovascular;  Laterality: N/A;   EYE SURGERY Bilateral    "after cataract OR"   INTRAVASCULAR ULTRASOUND/IVUS N/A 09/21/2017   Procedure: Intravascular Ultrasound/IVUS;  Surgeon: Elder Negus, MD;  Location: [facility] CV LAB;  Service: Cardiovascular;  Laterality: N/A;   RIGHT/LEFT HEART CATH AND CORONARY ANGIOGRAPHY N/A 08/31/2017   Procedure: RIGHT/LEFT HEART CATH AND CORONARY ANGIOGRAPHY;  Surgeon: Elder Negus, MD;  Location: [facility] CV LAB;  Service: Cardiovascular;  Laterality: N/A;   RIGHT/LEFT HEART CATH AND CORONARY ANGIOGRAPHY N/A 05/16/2019   Procedure: RIGHT/LEFT HEART CATH AND CORONARY ANGIOGRAPHY;  Surgeon: Elder Negus, MD;  Location: [facility] CV [facility];  Service: Cardiovascular;  Laterality: N/A;   TEE WITHOUT CARDIOVERSION N/A 06/06/2019   Procedure: TRANSESOPHAGEAL ECHOCARDIOGRAM (TEE);  Surgeon: Tonny Bollman, MD;  Location: [facility] INVASIVE CV LAB;  Service: Open Heart Surgery;  Laterality: N/A;   TRANSCATHETER AORTIC VALVE REPLACEMENT, TRANSFEMORAL N/A 06/06/2019   Procedure: TRANSCATHETER AORTIC VALVE REPLACEMENT, TRANSFEMORAL;  Surgeon: Tonny Bollman, MD;  Location: [facility] INVASIVE CV LAB;  Service: Open Heart Surgery;  Laterality: N/A;   ULTRASOUND GUIDANCE FOR VASCULAR ACCESS  09/21/2017   Procedure: Ultrasound Guidance For Vascular Access;  Surgeon: Elder Negus, MD;  Location: [facility] INVASIVE CV LAB;  Service: Cardiovascular;;   VARICOSE VEIN SURGERY Bilateral 1960s   [facility]     OB History   No obstetric history on file.     Family History  Problem Relation Age of Onset   Hypertension Mother      Social History   Tobacco Use   Smoking status: Never   Smokeless tobacco: Never  Vaping Use   Vaping Use: Never used  Substance Use Topics   Alcohol use: No   Drug use: No    Home Medications Prior to Admission medications   Medication Sig Start Date End Date Taking? Authorizing Provider  apixaban (ELIQUIS) 2.5 MG TABS tablet TAKE (1) TABLET BY MOUTH TWICE DAILY. Patient taking differently: Take 2.5 mg by mouth 2 (two) times daily. 08/01/20  Yes Medina-Vargas, Monina C, NP  Cholecalciferol (VITAMIN D3) 50 MCG (2000 UT) TABS Take 2 tablets by mouth daily.    Yes [provider]  Cranberry 360 MG CAPS Take 36 mg by mouth.   Yes [provider]  mirtazapine (REMERON) 15 MG tablet TAKE 1/2 TABLET BY MOUTH AT BEDTIME. 08/28/20  Yes Patwardhan, Manish J, MD  Multiple Vitamins-Minerals (MULTIVITAMIN WITH MINERALS) tablet Take 1 tablet by mouth daily.   Yes [provider]  nitroGLYCERIN (NITROSTAT) 0.4 MG SL tablet Place 1 tablet (0.4 mg total) under the tongue every 5 (five) minutes x 3 doses as needed for chest pain. 08/01/20  Yes Medina-Vargas, Monina C, NP  rosuvastatin (  CRESTOR) 20 MG tablet Take 20 mg by mouth daily.   Yes [provider]  Tetrahydrozoline-Zn Sulfate (EYE DROPS ALLERGY RELIEF OP) Place 1 drop into both eyes 3 (three) times daily as needed (allergy).    Yes [provider]  vitamin B-12 (CYANOCOBALAMIN) 1000 MCG tablet Take 1,000 mcg by mouth daily.   Yes [provider]  potassium chloride (KLOR-CON) 10 MEQ tablet Take 10 mEq by mouth 2 (two) times daily. Patient not taking: No sig reported    [provider]  saccharomyces boulardii (FLORASTOR) 250 MG capsule Take 1 capsule (250 mg total) by mouth 2 (two) times daily. Patient not taking: No sig reported 08/15/20   Lorin Glass, MD    Allergies    Codeine  Review of Systems   Review of Systems  Unable to perform ROS: Acuity of condition   Physical  Exam Updated Vital Signs BP (!) 98/56   Pulse 98   Temp 97.6 F (36.4 C) (Rectal)   Resp 20   Ht 5\' 4"  (1.626 m)   Wt 37.5 kg   SpO2 96%   BMI 14.18 kg/m   Physical Exam Vitals and nursing note reviewed.  Constitutional:      Appearance: She is ill-appearing. She is not toxic-appearing.     Comments: Patient is elderly and appears frail.  HENT:     Head: Atraumatic.     Mouth/Throat:     Mouth: Mucous membranes are dry.  Eyes:     Extraocular Movements: Extraocular movements intact.     Conjunctiva/sclera: Conjunctivae normal.     Pupils: Pupils are equal, round, and reactive to light.  Cardiovascular:     Rate and Rhythm: Regular rhythm. Tachycardia present.     Pulses: Normal pulses.  Pulmonary:     Effort: Pulmonary effort is normal.     Breath sounds: Normal breath sounds.  Abdominal:     General: There is no distension.     Palpations: Abdomen is soft.     Tenderness: There is no abdominal tenderness.  Musculoskeletal:     Cervical back: Normal range of motion.     Right lower leg: No edema.     Left lower leg: No edema.  Skin:    General: Skin is warm.  Neurological:     Mental Status: She is alert.     Sensory: No sensory deficit.     Comments: Patient has left-sided hemiparesis at baseline secondary to CVA August 2021    ED Results / Procedures / Treatments   Labs (all labs ordered are listed, but only abnormal results are displayed) Labs Reviewed  COMPREHENSIVE METABOLIC PANEL - Abnormal; Notable for the following components:      Result Value   Potassium 3.2 (*)    CO2 14 (*)    Creatinine, Ser 1.10 (*)    Calcium 8.7 (*)    Total Protein 5.7 (*)    Albumin 2.8 (*)    GFR, Estimated 48 (*)    Anion gap 17 (*)    All other components within normal limits  CBC WITH DIFFERENTIAL/PLATELET - Abnormal; Notable for the following components:   WBC 10.9 (*)    RBC 3.67 (*)    Hemoglobin 11.5 (*)    MCV 101.4 (*)    Neutro Abs 8.2 (*)    All other  components within normal limits  LACTIC ACID, PLASMA - Abnormal; Notable for the following components:   Lactic Acid, Venous 4.1 (*)  All other components within normal limits  PROTIME-INR - Abnormal; Notable for the following components:   Prothrombin Time 16.1 (*)    INR 1.3 (*)    All other components within normal limits  CULTURE, BLOOD (ROUTINE X 2)  CULTURE, BLOOD (ROUTINE X 2)  RESP PANEL BY RT-PCR (FLU A&B, COVID) [id number]  URINE CULTURE  APTT  LACTIC ACID, PLASMA  URINALYSIS, ROUTINE W REFLEX MICROSCOPIC  CREATININE, SERUM    EKG EKG Interpretation  Date/Time:  Tuesday June 17 2021 11:41:12 EDT Ventricular Rate:  108 PR Interval:  186 QRS Duration: 103 QT Interval:  379 QTC Calculation: 504 R Axis:   47 Text Interpretation: Sinus tachycardia Paired ventricular premature complexes Borderline T abnormalities, lateral leads Prolonged QT interval poor data quality prior ECG Confirmed by Linwood Dibbles [id number]) on 06/17/2021 11:50:59 AM  Radiology DG Chest Portable 1 View  Result Date: 06/17/2021 CLINICAL DATA:  Weakness. Nausea vomiting. History of coronary artery disease and hypertension. EXAM: PORTABLE CHEST 1 VIEW COMPARISON:  Chest x-ray 08/12/2020, CT chest 05/22/2019 FINDINGS: The heart size and mediastinal contours are unchanged. Aortic valve replacement. Coronary artery stents. Aortic calcification. Left hilar calcified lymph nodes. Calcified bronchial walls again noted which is most more prominent due to patient rotation in the left lung. Biapical pleural/pulmonary scarring with increased airspace opacity at the left apex which may be due to patient rotation. Hyperinflation of the lungs. No focal consolidation. Coarsened interstitial markings with no overt pulmonary edema. No pleural effusion. No pneumothorax. No acute osseous abnormality. IMPRESSION: 1. Biapical pleural/pulmonary scarring with increased airspace opacity at the left apex which may be due to patient rotation.  Recommend CT chest for further evaluation. 2. Aortic Atherosclerosis (ICD10-I70.0) and Emphysema (ICD10-J43.9). Electronically Signed   By: Tish Frederickson M.D.   On: 06/17/2021 16:12    Procedures Procedures   Medications Ordered in ED Medications  metroNIDAZOLE (FLAGYL) IVPB 500 mg (500 mg Intravenous New Bag/Given 06/17/21 1617)  vancomycin (VANCOCIN) IVPB 1000 mg/200 mL premix (1,000 mg Intravenous New Bag/Given 06/17/21 1659)  ceFEPIme (MAXIPIME) 2 g in sodium chloride 0.9 % 100 mL IVPB (has no administration in time range)  vancomycin (VANCOREADY) IVPB 500 mg/100 mL (has no administration in time range)  potassium chloride 10 mEq in 100 mL IVPB (has no administration in time range)  sodium chloride 0.9 % bolus 500 mL (0 mLs Intravenous Stopped 06/17/21 1547)  lactated ringers bolus 250 mL (0 mLs Intravenous Stopped 06/17/21 1614)  ceFEPIme (MAXIPIME) 2 g in sodium chloride 0.9 % 100 mL IVPB (0 g Intravenous Stopped 06/17/21 1654)    ED Course  I have reviewed the triage vital signs and the nursing notes.  Pertinent labs & imaging results that were available during my care of the patient were reviewed by me and considered in my medical decision making (see chart for details).    MDM Rules/Calculators/A&P                           Patient here companied by her daughter who is also her caregiver.  Comes from home.  Patient is DNR.  Here for evaluation of possible urosepsis.  Has completed course of Amoxil 500mg  x 2 courses for total of 8 days.   Daughter states that she continues to have decreased appetite and not taking her daily medications.  Last dose of Eliquis was yesterday for 1 dose.  On exam, patient is emaciated, she is awake and alert and  will answer questions appropriately.  Blood pressure soft.  No fever.  She was given 500 cc bolus by EMS prior to arrival.  Labs showed lactic acid of 4.1.  Mild hypokalemia with potassium of 3.2 creatinine appears baseline.  Bicarb is 14.   Mild leukocytosis with white count of 10.9.  EKG without acute ischemic change.  Chest x-ray without obvious infiltrate.  Code sepsis initiated patient given IV fluids at 30 mg per kilogram and antibiotics initiated.  Urine and blood cultures are pending. Pt given bicarb as well   CRITICAL CARE Performed by: Kohner Orlick Total critical care time: 35 minutes Critical care time was exclusive of separately billable procedures and treating other patients. Critical care was necessary to treat or prevent imminent or life-threatening deterioration. Critical care was time spent personally by me on the following activities: development of treatment plan with patient and/or surrogate as well as nursing, discussions with consultants, evaluation of patient's response to treatment, examination of patient, obtaining history from patient or surrogate, ordering and performing treatments and interventions, ordering and review of laboratory studies, ordering and review of radiographic studies, pulse oximetry and re-evaluation of patient's condition.   Consulted [facility] hospitalist, Dr. Margette Fast who agrees to admit.   Final Clinical Impression(s) / ED Diagnoses Final diagnoses:  Failure to thrive in adult  Dehydration    Rx / DC Orders ED Discharge Orders     None        Rosey Bath 06/17/21 1933    Linwood Dibbles, MD 06/18/21 [phone]

## 2021-06-17 NOTE — H&P (Signed)
.   Chief Complaint: Generalized weakness, poor oral intake HPI:  Catherine Anthony is an 87 y.o. 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  Patient presented to the emergency room accompanied by daughter who is at bedside.  Patient apparently has had poor oral intake for several weeks.  She was diagnosed with urinary tract infection and has had multiple antibiotics at home without improvement.  As per daughter, patient condition continues to deteriorate.  Patient is a poor historian has history was limited.  She denies any pain at this time.  Denies any shortness of breath.  Denies any fever or chills.  Denies any abdominal pain.  She has had episodes of loose stools but no obvious diarrhea or bloody stools.  In the ED, work-up showed evidence of hypotension.  Patient was noted to be lethargic and dehydrated.  Labs came back showing abnormal BMP with lactic acidosis and metabolic acidosis.  Urinalysis still pending at this time.  Patient was initiated on empiric antibiotics with cefepime and vancomycin for sepsis.  Patient was challenged with IV fluids with improvement with blood pressure.  Patient has had admissions in the past for severe sepsis.  CODE STATUS is DNR/DNI.  Past Medical History:  Diagnosis Date   Arthritis    Right hand   Complication of anesthesia    Coronary artery disease    s/p mutliple PCIs   Dyslipidemia    Family hx of colon cancer    History of kidney stones    Hypertension    PAF (paroxysmal atrial fibrillation) (HCC)    on Eliquis   S/P TAVR (transcatheter aortic valve replacement)    Severe aortic stenosis     Past Surgical History:  Procedure Laterality Date   CATARACT EXTRACTION W/ INTRAOCULAR LENS IMPLANT Right 06/04/2011   [facility]   CATARACT EXTRACTION W/PHACO  06/22/2011   Procedure: CATARACT EXTRACTION PHACO AND INTRAOCULAR LENS PLACEMENT  (IOC);  Surgeon: Gemma Payor;  Location: [facility];  Service: Ophthalmology;  Laterality: Left;  CDE 14.34   COLONOSCOPY  06/23/08   COLONOSCOPY N/A 01/18/2014   Procedure: COLONOSCOPY;  Surgeon: Malissa Hippo, MD;  Location: [facility];  Service: Endoscopy;  Laterality: N/A;  145   CORONARY ANGIOPLASTY WITH STENT PLACEMENT     CORONARY ATHERECTOMY N/A 09/21/2017   Procedure: CORONARY ATHERECTOMY;  Surgeon: Elder Negus, MD;  Location: [facility] INVASIVE CV [facility];  Service: Cardiovascular;  Laterality: N/A;   CORONARY STENT INTERVENTION N/A 08/31/2017   Procedure: CORONARY STENT INTERVENTION;  Surgeon: Elder Negus, MD;  Location: [facility];  Service: Cardiovascular;  Laterality: N/A;   CORONARY STENT INTERVENTION N/A 09/21/2017   Procedure: CORONARY STENT INTERVENTION;  Surgeon: Elder Negus, MD;  Location: [facility];  Service: Cardiovascular;  Laterality: N/A;   EYE SURGERY Bilateral    "after cataract OR"   INTRAVASCULAR ULTRASOUND/IVUS N/A 09/21/2017   Procedure: Intravascular Ultrasound/IVUS;  Surgeon: Elder Negus, MD;  Location: [facility];  Service: Cardiovascular;  Laterality: N/A;   RIGHT/LEFT HEART CATH AND CORONARY ANGIOGRAPHY N/A 08/31/2017   Procedure: RIGHT/LEFT HEART CATH AND CORONARY ANGIOGRAPHY;  Surgeon: Elder Negus, MD;  [facility];  Service: Cardiovascular;  Laterality: N/A;   RIGHT/LEFT HEART CATH AND CORONARY ANGIOGRAPHY N/A 05/16/2019   Procedure: RIGHT/LEFT HEART CATH AND CORONARY ANGIOGRAPHY;  Surgeon: Elder Negus, MD;  Location: [facility]  INVASIVE CV LAB;  Service: Cardiovascular;  Laterality: N/A;   TEE WITHOUT CARDIOVERSION N/A 06/06/2019   Procedure: TRANSESOPHAGEAL ECHOCARDIOGRAM (TEE);  Surgeon: Tonny Bollman, MD;  Location: [facility] INVASIVE CV LAB;  Service: Open Heart Surgery;  Laterality: N/A;   TRANSCATHETER AORTIC VALVE REPLACEMENT, TRANSFEMORAL N/A 06/06/2019   Procedure: TRANSCATHETER  AORTIC VALVE REPLACEMENT, TRANSFEMORAL;  Surgeon: Tonny Bollman, MD;  Location: [facility] INVASIVE CV LAB;  Service: Open Heart Surgery;  Laterality: N/A;   ULTRASOUND GUIDANCE FOR VASCULAR ACCESS  09/21/2017   Procedure: Ultrasound Guidance For Vascular Access;  Surgeon: Elder Negus, MD;  [facility]: [facility] CV [facility];  Service: Cardiovascular;;   VARICOSE VEIN SURGERY Bilateral 1960s   [facility]    Family History  Problem Relation Age of Onset   Hypertension Mother    Social History:  reports that she has never smoked. She has never used smokeless tobacco. She reports that she does not drink alcohol and does not use drugs.  Allergies:  Allergies  Allergen Reactions   Codeine Nausea And Vomiting    (Not in a hospital admission)   Results for orders placed or performed during the hospital encounter of 06/17/21 (from the past 48 hour(s))  Comprehensive metabolic panel     Status: Abnormal   Collection Time: 06/17/21 11:35 AM  Result Value Ref Range   Sodium 135 135 - 145 mmol/L   Potassium 3.2 (L) 3.5 - 5.1 mmol/L   Chloride 104 98 - 111 mmol/L   CO2 14 (L) 22 - 32 mmol/L   Glucose, Bld 75 70 - 99 mg/dL    Comment: Glucose reference range applies only to samples taken after fasting for at least 8 hours.   BUN 20 8 - 23 mg/dL   Creatinine, Ser 8.45 (H) 0.44 - 1.00 mg/dL   Calcium 8.7 (L) 8.9 - 10.3 mg/dL   Total Protein 5.7 (L) 6.5 - 8.1 g/dL   Albumin 2.8 (L) 3.5 - 5.0 g/dL   AST 30 15 - 41 U/L   ALT 16 0 - 44 U/L   Alkaline Phosphatase 83 38 - 126 U/L   Total Bilirubin 1.2 0.3 - 1.2 mg/dL   GFR, Estimated 48 (L) >60 mL/min    Comment: (NOTE) Calculated using the CKD-EPI Creatinine Equation (2021)    Anion gap 17 (H) 5 - 15    Comment: Performed at [facility], [address], [place], [place] [address]  CBC with Differential     Status: Abnormal   Collection Time: 06/17/21 11:35 AM  Result Value Ref Range   WBC 10.9 (H) 4.0 - 10.5 K/uL   RBC 3.67 (L) 3.87 - 5.11  MIL/uL   Hemoglobin 11.5 (L) 12.0 - 15.0 g/dL   HCT 03.2 12.2 - 48.2 %   MCV 101.4 (H) 80.0 - 100.0 fL   MCH 31.3 26.0 - 34.0 pg   MCHC 30.9 30.0 - 36.0 g/dL   RDW 50.0 37.0 - 48.8 %   Platelets 206 150 - 400 K/uL   nRBC 0.0 0.0 - 0.2 %   Neutrophils Relative % 74 %   Neutro Abs 8.2 (H) 1.7 - 7.7 K/uL   Lymphocytes Relative 20 %   Lymphs Abs 2.2 0.7 - 4.0 K/uL   Monocytes Relative 4 %   Monocytes Absolute 0.4 0.1 - 1.0 K/uL   Eosinophils Relative 0 %   Eosinophils Absolute 0.0 0.0 - 0.5 K/uL   Basophils Relative 1 %   Basophils Absolute 0.1 0.0 - 0.1 K/uL  Immature Granulocytes 1 %   Abs Immature Granulocytes 0.06 0.00 - 0.07 K/uL    Comment: Performed at [facility], [address] [place], [place] [address]  Lactic acid, plasma     Status: Abnormal   Collection Time: 06/17/21 11:35 AM  Result Value Ref Range   Lactic Acid, Venous 4.1 (HH) 0.5 - 1.9 mmol/L    Comment: CRITICAL RESULT CALLED TO, READ BACK BY AND VERIFIED WITH: SITES,K ON 06/17/21 AT 1520 BY LOY,C Performed at [facility], [address] [place], [place] [address]   Culture, blood (routine x 2)     Status: None (Preliminary result)   Collection Time: 06/17/21 11:35 AM   Specimen: BLOOD  Result Value Ref Range   Specimen Description BLOOD RIGHT WRIST    Special Requests      BOTTLES DRAWN AEROBIC AND ANAEROBIC Blood Culture adequate volume   Culture      NO GROWTH <12 HOURS Performed at [facility], [address] [place], [place] [address]    Report Status PENDING   Culture, blood (routine x 2)     Status: None (Preliminary result)   Collection Time: 06/17/21  2:42 PM   Specimen: BLOOD  Result Value Ref Range   Specimen Description BLOOD RIGHT ARM    Special Requests      BOTTLES DRAWN AEROBIC AND ANAEROBIC Blood Culture adequate volume Performed at [facility], [address] [place], [place] [address]    Culture PENDING    Report Status PENDING   Protime-INR     Status: Abnormal   Collection  Time: 06/17/21  3:40 PM  Result Value Ref Range   Prothrombin Time 16.1 (H) 11.4 - 15.2 seconds   INR 1.3 (H) 0.8 - 1.2    Comment: (NOTE) INR goal varies based on device and disease states. Performed at [facility], [address] [place], [place] [address]   APTT     Status: None   Collection Time: 06/17/21  3:40 PM  Result Value Ref Range   aPTT 35 24 - 36 seconds    Comment: Performed at [facility], [address] [place], [place] [address]  Resp Panel by RT-PCR (Flu A&B, Covid) Nasopharyngeal Swab     Status: None   Collection Time: 06/17/21  4:30 PM   Specimen: Nasopharyngeal Swab; Nasopharyngeal(NP) swabs in vial transport medium  Result Value Ref Range   SARS Coronavirus 2 by RT PCR NEGATIVE NEGATIVE    Comment: (NOTE) SARS-CoV-2 target nucleic acids are NOT DETECTED.  The SARS-CoV-2 RNA is generally detectable in upper respiratory specimens during the acute phase of infection. The lowest concentration of SARS-CoV-2 viral copies this assay can detect is 138 copies/mL. A negative result does not preclude SARS-Cov-2 infection and should not be used as the sole basis for treatment or other patient management decisions. A negative result may occur with  improper specimen collection/handling, submission of specimen other than nasopharyngeal swab, presence of viral mutation(s) within the areas targeted by this assay, and inadequate number of viral copies(<138 copies/mL). A negative result must be combined with clinical observations, patient history, and epidemiological information. The expected result is Negative.  Fact Sheet for Patients:  BloggerCourse.comhttps://www.fda.gov/media/152166/download  Fact Sheet for Healthcare Providers:  SeriousBroker.ithttps://www.fda.gov/media/152162/download  This test is no t yet approved or cleared by the [place] FDA and  has been authorized for detection and/or diagnosis of SARS-CoV-2 by FDA under an Emergency Use Authorization (EUA). This EUA will remain   in effect (meaning  this test can be used) for the duration of the COVID-19 declaration under Section 564(b)(1) of the Act, 21 U.S.C.section 360bbb-3(b)(1), unless the authorization is terminated  or revoked sooner.       Influenza A by PCR NEGATIVE NEGATIVE   Influenza B by PCR NEGATIVE NEGATIVE    Comment: (NOTE) The Xpert Xpress SARS-CoV-2/FLU/RSV plus assay is intended as an aid in the diagnosis of influenza from Nasopharyngeal swab specimens and should not be used as a sole basis for treatment. Nasal washings and aspirates are unacceptable for Xpert Xpress SARS-CoV-2/FLU/RSV testing.  Fact Sheet for Patients: BloggerCourse.com  Fact Sheet for Healthcare Providers: SeriousBroker.it  This test is not yet approved or cleared by the [place] FDA and has been authorized for detection and/or diagnosis of SARS-CoV-2 by FDA under an Emergency Use Authorization (EUA). This EUA will remain in effect (meaning this test can be used) for the duration of the COVID-19 declaration under Section 564(b)(1) of the Act, 21 U.S.C. section 360bbb-3(b)(1), unless the authorization is terminated or revoked.  Performed at [facility], [address], [place], [place] [address]    DG Chest Portable 1 View  Result Date: 06/17/2021 CLINICAL DATA:  Weakness. Nausea vomiting. History of coronary artery disease and hypertension. EXAM: PORTABLE CHEST 1 VIEW COMPARISON:  Chest x-ray 08/12/2020, CT chest 05/22/2019 FINDINGS: The heart size and mediastinal contours are unchanged. Aortic valve replacement. Coronary artery stents. Aortic calcification. Left hilar calcified lymph nodes. Calcified bronchial walls again noted which is most more prominent due to patient rotation in the left lung. Biapical pleural/pulmonary scarring with increased airspace opacity at the left apex which may be due to patient rotation. Hyperinflation of the lungs. No focal  consolidation. Coarsened interstitial markings with no overt pulmonary edema. No pleural effusion. No pneumothorax. No acute osseous abnormality. IMPRESSION: 1. Biapical pleural/pulmonary scarring with increased airspace opacity at the left apex which may be due to patient rotation. Recommend CT chest for further evaluation. 2. Aortic Atherosclerosis (ICD10-I70.0) and Emphysema (ICD10-J43.9). Electronically Signed   By: Tish Frederickson M.D.   On: 06/17/2021 16:12    Review of Systems  Blood pressure 110/66, pulse (!) 113, temperature 97.6 F (36.4 C), temperature source Rectal, resp. rate (!) 28, height 5\' 4"  (1.626 m), weight 37.5 kg, SpO2 98 %. Physical Exam   Assessment/Plan Acute Metabolic acidosis: Likely related to sepsis versus GI losses.. Lactate was noted to 4.1 on admission. Patient was volume repleted with Ringer's lactate.  Suspected UTI with sepsis and hypotension: Urinalysis pending for now.  Patient has had multiple antibiotic treatment for UTI recently.  Patient was empirically initiated on antibiotics in the ED.  We will hold off on antibiotics pending urinalysis report.  Foley catheter to monitor for input and output.  Hypotension:  Hypokalemia: Likely due to poor oral intake.  Replete as per protocol.   Failure to thrive: Patient has multiple medical comorbidities and considering advanced age.  Palliative care team may need to be consulted to advise on further management.  Patient is currently DNR/DNI.  Malnutrition: Send for microalbumin.  Nutritionist/Dietician evaluation advised.  Ensure 3 times daily with meals.  Hyperlipidemia: Continue with statins.     , MD 06/17/2021, 6:50 PM

## 2021-06-17 NOTE — ED Notes (Signed)
ED TO INPATIENT HANDOFF REPORT  ED Nurse Name and Phone #:   S Name/Age/Gender Catherine Anthony 87 y.o. female Room/Bed: APA16A/APA16A  Code Status   Code Status: Prior  Home/SNF/Other Home Patient oriented to: self, place, time and situation Is this baseline? Yes   Triage Complete: Triage complete  Chief Complaint Chronic a-fib (HCC) [I48.20] Metabolic acidosis [E87.2]  Triage Note Pt brought to ED via [facility] for nausea and vomiting since Saturday night. Pt has been treated twice for UTI since June 28. Pt tachycardic, tachypneic. Given Zofran 4 mg PTA, NS 500 ml bolus. Pt not wanting to eat or drink.     Allergies Allergies  Allergen Reactions  . Codeine Nausea And Vomiting    Level of Care/Admitting Diagnosis ED Disposition    ED Disposition  Admit   Condition  --   Comment  Hospital Area: [facility] [[id number]]  Level of Care: Stepdown [14]  Covid Evaluation: Asymptomatic Screening Protocol (No Symptoms)  Diagnosis: Metabolic acidosis [221728]  Admitting Physician: Lilia Pro [[id number]]  Attending Physician: Chuck Hint  Estimated length of stay: past midnight tomorrow  Certification:: I certify this patient will need inpatient services for at least 2 midnights         B Medical/Surgery History Past Medical History:  Diagnosis Date  . Arthritis    Right hand  . Complication of anesthesia   . Coronary artery disease    s/p mutliple PCIs  . Dyslipidemia   . Family hx of colon cancer   . History of kidney stones   . Hypertension   . PAF (paroxysmal atrial fibrillation) (HCC)    on Eliquis  . S/P TAVR (transcatheter aortic valve replacement)   . Severe aortic stenosis    Past Surgical History:  Procedure Laterality Date  . CATARACT EXTRACTION W/ INTRAOCULAR LENS IMPLANT Right 06/04/2011   [facility]  . CATARACT EXTRACTION W/PHACO  06/22/2011   Procedure: CATARACT EXTRACTION PHACO AND INTRAOCULAR LENS PLACEMENT (IOC);   Surgeon: Gemma Payor;  Location: [facility];  Service: Ophthalmology;  Laterality: Left;  CDE 14.34  . COLONOSCOPY  06/23/08  . COLONOSCOPY N/A 01/18/2014   Procedure: COLONOSCOPY;  Surgeon: Malissa Hippo, MD;  Location: [facility];  Service: Endoscopy;  Laterality: N/A;  145  . CORONARY ANGIOPLASTY WITH STENT PLACEMENT    . CORONARY ATHERECTOMY N/A 09/21/2017   Procedure: CORONARY ATHERECTOMY;  Surgeon: Elder Negus, MD;  Location: [facility] INVASIVE CV LAB;  Service: Cardiovascular;  Laterality: N/A;  . CORONARY STENT INTERVENTION N/A 08/31/2017   Procedure: CORONARY STENT INTERVENTION;  Surgeon: Elder Negus, MD;  Location: [facility] CV LAB;  Service: Cardiovascular;  Laterality: N/A;  . CORONARY STENT INTERVENTION N/A 09/21/2017   Procedure: CORONARY STENT INTERVENTION;  Surgeon: Elder Negus, MD;  Location: [facility] CV LAB;  Service: Cardiovascular;  Laterality: N/A;  . EYE SURGERY Bilateral    "after cataract OR"  . INTRAVASCULAR ULTRASOUND/IVUS N/A 09/21/2017   Procedure: Intravascular Ultrasound/IVUS;  Surgeon: Elder Negus, MD;  [facility];  Service: Cardiovascular;  Laterality: N/A;  . RIGHT/LEFT HEART CATH AND CORONARY ANGIOGRAPHY N/A 08/31/2017   Procedure: RIGHT/LEFT HEART CATH AND CORONARY ANGIOGRAPHY;  Surgeon: Elder Negus, MD;  [facility];  Service: Cardiovascular;  Laterality: N/A;  . RIGHT/LEFT HEART CATH AND CORONARY ANGIOGRAPHY N/A 05/16/2019   Procedure: RIGHT/LEFT HEART CATH AND CORONARY ANGIOGRAPHY;  Surgeon: Elder Negus, MD;  [facility] CV  LAB;  Service: Cardiovascular;  Laterality: N/A;  . TEE WITHOUT CARDIOVERSION N/A 06/06/2019   Procedure: TRANSESOPHAGEAL ECHOCARDIOGRAM (TEE);  Surgeon: Tonny Bollman, MD;  Location: [facility] INVASIVE CV LAB;  Service: Open Heart Surgery;  Laterality: N/A;  . TRANSCATHETER AORTIC VALVE REPLACEMENT, TRANSFEMORAL N/A 06/06/2019   Procedure: TRANSCATHETER  AORTIC VALVE REPLACEMENT, TRANSFEMORAL;  Surgeon: Tonny Bollman, MD;  Location: [facility] INVASIVE CV LAB;  Service: Open Heart Surgery;  Laterality: N/A;  . ULTRASOUND GUIDANCE FOR VASCULAR ACCESS  09/21/2017   Procedure: Ultrasound Guidance For Vascular Access;  Surgeon: Elder Negus, MD;  [facility] CV [facility];  Service: Cardiovascular;;  . VARICOSE VEIN SURGERY Bilateral 1960s   [facility]     A IV Location/Drains/Wounds Patient Lines/Drains/Airways Status    Active Line/Drains/Airways    Name Placement date Placement time Site Days   Peripheral IV 06/17/21 18 G Right Antecubital 06/17/21  1126  Antecubital  less than 1   Peripheral IV 06/17/21 20 G 1" Right Wrist 06/17/21  1139  Wrist  less than 1   External Urinary Catheter 06/17/21  1159  --  less than 1   Pressure Injury 08/13/20 Heel Left Deep Tissue Pressure Injury - Purple or maroon localized area of discolored intact skin or blood-filled blister due to damage of underlying soft tissue from pressure and/or shear. pt states she developed this while at r 08/13/20  1905  -- 308          Intake/Output Last 24 hours  Intake/Output Summary (Last 24 hours) at 06/17/2021 2114 Last data filed at 06/17/2021 1837 Gross per 24 hour  Intake 1250 ml  Output --  Net 1250 ml    Labs/Imaging Results for orders placed or performed during the hospital encounter of 06/17/21 (from the past 48 hour(s))  Comprehensive metabolic panel     Status: Abnormal   Collection Time: 06/17/21 11:35 AM  Result Value Ref Range   Sodium 135 135 - 145 mmol/L   Potassium 3.2 (L) 3.5 - 5.1 mmol/L   Chloride 104 98 - 111 mmol/L   CO2 14 (L) 22 - 32 mmol/L   Glucose, Bld 75 70 - 99 mg/dL    Comment: Glucose reference range applies only to samples taken after fasting for at least 8 hours.   BUN 20 8 - 23 mg/dL   Creatinine, Ser 2.24 (H) 0.44 - 1.00 mg/dL   Calcium 8.7 (L) 8.9 - 10.3 mg/dL   Total Protein 5.7 (L) 6.5 - 8.1 g/dL   Albumin 2.8 (L)  3.5 - 5.0 g/dL   AST 30 15 - 41 U/L   ALT 16 0 - 44 U/L   Alkaline Phosphatase 83 38 - 126 U/L   Total Bilirubin 1.2 0.3 - 1.2 mg/dL   GFR, Estimated 48 (L) >60 mL/min    Comment: (NOTE) Calculated using the CKD-EPI Creatinine Equation (2021)    Anion gap 17 (H) 5 - 15    Comment: Performed at [facility], [address] [place], [place] [address]  CBC with Differential     Status: Abnormal   Collection Time: 06/17/21 11:35 AM  Result Value Ref Range   WBC 10.9 (H) 4.0 - 10.5 K/uL   RBC 3.67 (L) 3.87 - 5.11 MIL/uL   Hemoglobin 11.5 (L) 12.0 - 15.0 g/dL   HCT 37.0 48.8 - 89.1 %   MCV 101.4 (H) 80.0 - 100.0 fL   MCH 31.3 26.0 - 34.0 pg   MCHC 30.9 30.0 - 36.0 g/dL  RDW 14.6 11.5 - 15.5 %   Platelets 206 150 - 400 K/uL   nRBC 0.0 0.0 - 0.2 %   Neutrophils Relative % 74 %   Neutro Abs 8.2 (H) 1.7 - 7.7 K/uL   Lymphocytes Relative 20 %   Lymphs Abs 2.2 0.7 - 4.0 K/uL   Monocytes Relative 4 %   Monocytes Absolute 0.4 0.1 - 1.0 K/uL   Eosinophils Relative 0 %   Eosinophils Absolute 0.0 0.0 - 0.5 K/uL   Basophils Relative 1 %   Basophils Absolute 0.1 0.0 - 0.1 K/uL   Immature Granulocytes 1 %   Abs Immature Granulocytes 0.06 0.00 - 0.07 K/uL    Comment: Performed at [facility], [address], [place], [place] [address]  Lactic acid, plasma     Status: Abnormal   Collection Time: 06/17/21 11:35 AM  Result Value Ref Range   Lactic Acid, Venous 4.1 (HH) 0.5 - 1.9 mmol/L    Comment: CRITICAL RESULT CALLED TO, READ BACK BY AND VERIFIED WITH: Tishana Clinkenbeard,K ON 06/17/21 AT 1520 BY LOY,C Performed at [facility], [address], [place], [place] [address]   Culture, blood (routine x 2)     Status: None (Preliminary result)   Collection Time: 06/17/21 11:35 AM   Specimen: BLOOD  Result Value Ref Range   Specimen Description BLOOD RIGHT WRIST    Special Requests      BOTTLES DRAWN AEROBIC AND ANAEROBIC Blood Culture adequate volume   Culture      NO GROWTH <12 HOURS Performed at [facility], [address], [place], [place] [address]    Report Status PENDING   Culture, blood (routine x 2)     Status: None (Preliminary result)   Collection Time: 06/17/21  2:42 PM   Specimen: BLOOD  Result Value Ref Range   Specimen Description BLOOD RIGHT ARM    Special Requests      BOTTLES DRAWN AEROBIC AND ANAEROBIC Blood Culture adequate volume Performed at [facility], [address], [place], [place] [address]    Culture PENDING    Report Status PENDING   Protime-INR     Status: Abnormal   Collection Time: 06/17/21  3:40 PM  Result Value Ref Range   Prothrombin Time 16.1 (H) 11.4 - 15.2 seconds   INR 1.3 (H) 0.8 - 1.2    Comment: (NOTE) INR goal varies based on device and disease states. Performed at [facility], [address] [place], [place] [address]   APTT     Status: None   Collection Time: 06/17/21  3:40 PM  Result Value Ref Range   aPTT 35 24 - 36 seconds    Comment: Performed at [facility], [address], [place], [place] [address]  Resp Panel by RT-PCR (Flu A&B, Covid) Nasopharyngeal Swab     Status: None   Collection Time: 06/17/21  4:30 PM   Specimen: Nasopharyngeal Swab; Nasopharyngeal(NP) swabs in vial transport medium  Result Value Ref Range   SARS Coronavirus 2 by RT PCR NEGATIVE NEGATIVE    Comment: (NOTE) SARS-CoV-2 target nucleic acids are NOT DETECTED.  The SARS-CoV-2 RNA is generally detectable in upper respiratory specimens during the acute phase of infection. The lowest concentration of SARS-CoV-2 viral copies this assay can detect is 138 copies/mL. A negative result does not preclude SARS-Cov-2 infection and should not be used as the sole basis for treatment or other patient management decisions. A negative result may occur with  improper specimen collection/handling, submission  of specimen other than nasopharyngeal swab, presence of viral mutation(s) within the areas targeted by this assay, and inadequate number of viral copies(<138  copies/mL). A negative result must be combined with clinical observations, patient history, and epidemiological information. The expected result is Negative.  Fact Sheet for Patients:  BloggerCourse.comhttps://www.fda.gov/media/152166/download  Fact Sheet for Healthcare Providers:  SeriousBroker.ithttps://www.fda.gov/media/152162/download  This test is no t yet approved or cleared by the [place] FDA and  has been authorized for detection and/or diagnosis of SARS-CoV-2 by FDA under an Emergency Use Authorization (EUA). This EUA will remain  in effect (meaning this test can be used) for the duration of the COVID-19 declaration under Section 564(b)(1) of the Act, 21 U.S.C.section 360bbb-3(b)(1), unless the authorization is terminated  or revoked sooner.       Influenza A by PCR NEGATIVE NEGATIVE   Influenza B by PCR NEGATIVE NEGATIVE    Comment: (NOTE) The Xpert Xpress SARS-CoV-2/FLU/RSV plus assay is intended as an aid in the diagnosis of influenza from Nasopharyngeal swab specimens and should not be used as a sole basis for treatment. Nasal washings and aspirates are unacceptable for Xpert Xpress SARS-CoV-2/FLU/RSV testing.  Fact Sheet for Patients: BloggerCourse.comhttps://www.fda.gov/media/152166/download  Fact Sheet for Healthcare Providers: SeriousBroker.ithttps://www.fda.gov/media/152162/download  This test is not yet approved or cleared by the [place] FDA and has been authorized for detection and/or diagnosis of SARS-CoV-2 by FDA under an Emergency Use Authorization (EUA). This EUA will remain in effect (meaning this test can be used) for the duration of the COVID-19 declaration under Section 564(b)(1) of the Act, 21 U.S.C. section 360bbb-3(b)(1), unless the authorization is terminated or revoked.  Performed at [facility], [address] [place], [place] [id number]    DG Chest Portable 1 View  Result Date: 06/17/2021 CLINICAL DATA:  Weakness. Nausea vomiting. History of coronary artery disease and hypertension.  EXAM: PORTABLE CHEST 1 VIEW COMPARISON:  Chest x-ray 08/12/2020, CT chest 05/22/2019 FINDINGS: The heart size and mediastinal contours are unchanged. Aortic valve replacement. Coronary artery stents. Aortic calcification. Left hilar calcified lymph nodes. Calcified bronchial walls again noted which is most more prominent due to patient rotation in the left lung. Biapical pleural/pulmonary scarring with increased airspace opacity at the left apex which may be due to patient rotation. Hyperinflation of the lungs. No focal consolidation. Coarsened interstitial markings with no overt pulmonary edema. No pleural effusion. No pneumothorax. No acute osseous abnormality. IMPRESSION: 1. Biapical pleural/pulmonary scarring with increased airspace opacity at the left apex which may be due to patient rotation. Recommend CT chest for further evaluation. 2. Aortic Atherosclerosis (ICD10-I70.0) and Emphysema (ICD10-J43.9). Electronically Signed   By: Tish FredericksonMorgane  Naveau M.D.   On: 06/17/2021 16:12    Pending Labs Unresulted Labs (From admission, onward)    Start     Ordered   06/18/21 0500  Creatinine, serum  Every Mon-Wed-Fri (0500),   R (with STAT occurrences)      06/17/21 1618   06/17/21 2200  Basic metabolic panel  Once-Timed,   STAT        06/17/21 1851   06/17/21 1937  Urinalysis, Routine w reflex microscopic Urine, Unspecified Source  ONCE - STAT,   STAT        06/17/21 1937   06/17/21 1411  Lactic acid, plasma  Now then every 2 hours,   STAT      06/17/21 1413   06/17/21 1411  Urinalysis, Routine w reflex microscopic  Once,   STAT        06/17/21  1413   06/17/21 1411  Urine Culture  Once,   STAT       Question:  Indication  Answer:  Dysuria   06/17/21 1413   Signed and Held  Protime-INR  Tomorrow morning,   R        Signed and Held   Signed and Held  Cortisol-am, blood  Tomorrow morning,   R        Signed and Held   Signed and Held  Procalcitonin  Tomorrow morning,   R        Signed and Held   Signed  and Held  Basic metabolic panel  Tomorrow morning,   R        Signed and Held   Signed and Held  CBC  Tomorrow morning,   R        Signed and Held          Vitals/Pain Today's Vitals   06/17/21 1830 06/17/21 1930 06/17/21 2000 06/17/21 2030  BP: 110/66 (!) 97/59 (!) 90/57 (!) 93/51  Pulse: (!) 113 99 95 93  Resp: (!) 28 18 20 19   Temp:      TempSrc:      SpO2: 98% 96% 91% 96%  Weight:      Height:      PainSc:        Isolation Precautions Airborne and Contact precautions  Medications Medications  ceFEPIme (MAXIPIME) 2 g in sodium chloride 0.9 % 100 mL IVPB (has no administration in time range)  vancomycin (VANCOREADY) IVPB 500 mg/100 mL (has no administration in time range)  sodium bicarbonate 150 mEq in dextrose 5 % 1,150 mL infusion (has no administration in time range)  feeding supplement (ENSURE ENLIVE / ENSURE PLUS) liquid 237 mL (has no administration in time range)  sodium chloride 0.9 % bolus 500 mL (0 mLs Intravenous Stopped 06/17/21 1547)  lactated ringers bolus 250 mL (0 mLs Intravenous Stopped 06/17/21 1614)  ceFEPIme (MAXIPIME) 2 g in sodium chloride 0.9 % 100 mL IVPB (0 g Intravenous Stopped 06/17/21 1654)  metroNIDAZOLE (FLAGYL) IVPB 500 mg (0 mg Intravenous Stopped 06/17/21 1738)  vancomycin (VANCOCIN) IVPB 1000 mg/200 mL premix (0 mg Intravenous Stopped 06/17/21 1808)  potassium chloride 10 mEq in 100 mL IVPB (0 mEq Intravenous Stopped 06/17/21 1837)  sodium bicarbonate injection 100 mEq (100 mEq Intravenous Given 06/17/21 1810)    Mobility non-ambulatory High fall risk   Focused Assessments    R Recommendations: See Admitting Provider Note  Report given to:   Additional Notes:

## 2021-06-18 DIAGNOSIS — R54 Age-related physical debility: Secondary | ICD-10-CM | POA: Diagnosis not present

## 2021-06-18 DIAGNOSIS — E860 Dehydration: Secondary | ICD-10-CM | POA: Diagnosis not present

## 2021-06-18 DIAGNOSIS — R627 Adult failure to thrive: Secondary | ICD-10-CM | POA: Diagnosis not present

## 2021-06-18 DIAGNOSIS — E872 Acidosis: Secondary | ICD-10-CM | POA: Diagnosis not present

## 2021-06-18 DIAGNOSIS — I482 Chronic atrial fibrillation, unspecified: Secondary | ICD-10-CM | POA: Diagnosis not present

## 2021-06-18 DIAGNOSIS — Z7189 Other specified counseling: Secondary | ICD-10-CM

## 2021-06-18 DIAGNOSIS — Z8673 Personal history of transient ischemic attack (TIA), and cerebral infarction without residual deficits: Secondary | ICD-10-CM

## 2021-06-18 DIAGNOSIS — Z515 Encounter for palliative care: Secondary | ICD-10-CM

## 2021-06-18 LAB — PROCALCITONIN: Procalcitonin: <0.1 ng/mL

## 2021-06-18 LAB — CBC
HCT: 32.9 % — ABNORMAL LOW (ref 36.0–46.0)
Hemoglobin: 10.4 g/dL — ABNORMAL LOW (ref 12.0–15.0)
MCH: 31 pg (ref 26.0–34.0)
MCHC: 31.6 g/dL (ref 30.0–36.0)
MCV: 98.2 fL (ref 80.0–100.0)
Platelets: 149 10*3/uL — ABNORMAL LOW (ref 150–400)
RBC: 3.35 MIL/uL — ABNORMAL LOW (ref 3.87–5.11)
RDW: 14.7 % (ref 11.5–15.5)
WBC: 9.4 10*3/uL (ref 4.0–10.5)
nRBC: 0 % (ref 0.0–0.2)

## 2021-06-18 LAB — BASIC METABOLIC PANEL
Anion gap: 14 (ref 5–15)
BUN: 18 mg/dL (ref 8–23)
CO2: 21 mmol/L — ABNORMAL LOW (ref 22–32)
Calcium: 7.6 mg/dL — ABNORMAL LOW (ref 8.9–10.3)
Chloride: 104 mmol/L (ref 98–111)
Creatinine, Ser: 0.98 mg/dL (ref 0.44–1.00)
GFR, Estimated: 56 mL/min — ABNORMAL LOW (ref >60)
Glucose, Bld: 149 mg/dL — ABNORMAL HIGH (ref 70–99)
Potassium: 3.3 mmol/L — ABNORMAL LOW (ref 3.5–5.1)
Sodium: 139 mmol/L (ref 135–145)

## 2021-06-18 LAB — PROTIME-INR
INR: 1.2 (ref 0.8–1.2)
Prothrombin Time: 15.2 seconds (ref 11.4–15.2)

## 2021-06-18 LAB — MRSA NEXT GEN BY PCR, NASAL: MRSA by PCR Next Gen: NOT DETECTED

## 2021-06-18 LAB — CORTISOL-AM, BLOOD: Cortisol - AM: 26.7 ug/dL — ABNORMAL HIGH (ref 6.7–22.6)

## 2021-06-18 MED ORDER — CHLORHEXIDINE GLUCONATE CLOTH 2 % EX PADS
6.0000 | MEDICATED_PAD | Freq: Every day | CUTANEOUS | Status: DC
  Administered 2021-06-18 – 2021-06-19 (×2): 6 via TOPICAL

## 2021-06-18 NOTE — Progress Notes (Signed)
Initial Nutrition Assessment  DOCUMENTATION CODES:   Severe malnutrition in context of acute illness/injury, Underweight  INTERVENTION:  Ensure Enlive po BID, each supplement provides 350 kcal and 20 grams of protein   Liberalize therapeutic diet restrictions   Assist with feeding all meals    NUTRITION DIAGNOSIS:   Severe Malnutrition related to poor appetite recent UTI, anorexia as evidenced by per patient/family report, energy intake < or equal to 50% for > or equal to 5 days, percent weight loss.   GOAL:   (Based on patient and family health care decisions)  MONITOR:  Supplement acceptance, PO intake, Weight trends, Skin, Labs  REASON FOR ASSESSMENT:   Malnutrition Screening Tool, Low Braden    ASSESSMENT: Patient is an underweight 87 yo female with poor appetite, nausea, refusing breakfast and medications. PMH: CAD, ischemic stroke with left hemiparesis from home. Recent treatment for UTI. Hypokalemia.    Heart Healthy diet with 0% intake since admission. ONS ordered. Patient daughter is bedside and provided nutrition history. Daughter says, " good luck getting her to eat or drink anything". Refusing Ensure and Boost at home, she likes Pepsi but hasn't taken any since vomiting this weekend. Trial of diet downgrade may be helpful if pt is willing to consume.  Questioned if pt would want tube placed for nutrition and she is shaking her head no. Recommend palliative consult for goals of care.   Daughter says, patient has been declining since stroke last August.   Weight was 95 lb per recent Cardiology note. Currently 83.3 lb a severe loss of 13% < 1 month.   Patient is taking a medication for appetite(Remeron), MVI, Potassium supplement, Florastor and Vitamin B-12 & D3.   Labs: BMP Latest Ref Rng & Units 06/18/2021 06/17/2021 06/17/2021  Glucose 70 - 99 mg/dL 419(F) 96 75  BUN 8 - 23 mg/dL 18 21 20   Creatinine 0.44 - 1.00 mg/dL 7.90) 2.40(X)  BUN/Creat Ratio 12 -  28 - - -  Sodium 135 - 145 mmol/L 139 143 135  Potassium 3.5 - 5.1 mmol/L 3.3(L) 3.7 3.2(L)  Chloride 98 - 111 mmol/L 104 108 104  CO2 22 - 32 mmol/L 21(L) 18(L) 14(L)  Calcium 8.9 - 10.3 mg/dL 7.6(L) 8.1(L) 8.7(L)     NUTRITION - FOCUSED PHYSICAL EXAM:  Nutrition-Focused physical exam findings are moderate buccal fat depletion, moderate clavicle, deltoid muscle depletion, and no edema.    Diet Order:   Diet Order             Diet Heart Room service appropriate? Yes; Fluid consistency: Thin  Diet effective now                   EDUCATION NEEDS:  Education needs have been addressed   Skin:  Skin Assessment: Reviewed RN Assessment  Last BM:  prior to admission  Height:   Ht Readings from Last 1 Encounters:  06/17/21 5\' 4"  (1.626 m)    Weight:   Wt Readings from Last 1 Encounters:  06/17/21 37.9 kg    Ideal Body Weight:   55 kg  BMI:  Body mass index is 14.34 kg/m.  Estimated Nutritional Needs:   Kcal:  1350-1500  Protein:  57-68 gr  Fluid:  > 1100 ml daily  MS,RD,CSG,LDN Contact: 06/19/21

## 2021-06-18 NOTE — Progress Notes (Signed)
Patient still complaining of nausea despite being given compazine at 0550 this morning. Refusing to eat breakfast as well as refusing to take PO medications.

## 2021-06-18 NOTE — Progress Notes (Signed)
PROGRESS NOTE    Catherine Anthony  DEY:[id number] DOB: 1930/08/24 DOA: 06/17/2021 PCP: [name], MD   Chief Complaint  Patient presents with   Emesis    Brief admission narrative:  Catherine Anthony is an 87 y.o. 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  Patient presented to the emergency room accompanied by daughter who is at bedside.  Patient apparently has had poor oral intake for several weeks.  She was diagnosed with urinary tract infection and has had multiple antibiotics at home without improvement.  As per daughter, patient condition continues to deteriorate.  Patient is a poor historian has history was limited.  She denies any pain at this time.  Denies any shortness of breath.  Denies any fever or chills.  Denies any abdominal pain.  She has had episodes of loose stools but no obvious diarrhea or bloody stools.   In the ED, work-up showed evidence of hypotension.  Patient was noted to be lethargic and dehydrated.  Labs came back showing abnormal BMP with lactic acidosis and metabolic acidosis.  Urinalysis still pending at this time.   Assessment & Plan: 1-sepsis with hypotension and suspected UTI -Improvement in patient blood pressure appreciated after fluid resuscitation -Currently afebrile -Cultures pending -Continue current Rocephin. -Follow final culture result -Procalcitonin less than 0.10. -Continue supportive care.  2-failure to thrive/severe protein calorie malnutrition -Patient has expressed not wanting feeding tube -Continue to incentive oral intake and feeding supplements -Body mass index is 14.34 kg/m. -Appreciate nutritionist/dietitian involvement. -Palliative care has been consulted. -Low-dose Remeron can be added to regimen.  3-chronic parasitic small atrial fibrillation -Rate control at this time -During most recently sent to  cardiologist minimizing medication burden metoprololAnd amiodarone were discontinued. -Continue Eliquis.  4-hyperlipidemia -Continue statins.  5-hypokalemia in the setting of poor oral intake -Continue repletion and follow trend.  6-acute metabolic acidosis and elevated lactate -With concerns of severe dehydration and suspected UTI -Continue empirical antibiotic -Continue IV fluids -Follow clinical response.     Chronic a-fib (HCC)   Metabolic acidosis      DVT prophylaxis: Chronically on Eliquis. Code Status: DNR/DNI Family Communication: Daughter at bedside. Disposition:   Status is: Inpatient  Remains inpatient appropriate because:IV treatments appropriate due to intensity of illness or inability to take PO  Dispo: The patient is from: Home              Anticipated d/c is to: Home              Patient currently is not medically stable to d/c.   Difficult to place patient No       Consultants:  Palliative care  Procedures:  See below for x-ray reports.  Antimicrobials:  Rocephin   Subjective: Chronically ill in appearance; underweight.  No fever, no chest pain, no nausea, no vomiting.  Patient in no major distress currently tolerating some liquid diet.  Objective: Vitals:   06/18/21 1400 06/18/21 1500 06/18/21 1703 06/18/21 1800  BP: (!) 118/47 (!) 120/46  129/66  Pulse: 99 89 (!) 101 99  Resp: 20 19 (!) 37 (!) 22  Temp:   99.2 F (37.3 C)   TempSrc:   Oral   SpO2: 95% 96% 97% 96%  Weight:      Height:        Intake/Output Summary (Last 24 hours) at 06/18/2021 1820 Last data filed at 06/18/2021 1811 Gross per 24  hour  Intake 1032 ml  Output 450 ml  Net 582 ml   Filed Weights   06/17/21 1127 06/17/21 2328  Weight: 37.5 kg 37.9 kg    Examination:  General exam: Appears calm and comfortable; afebrile currently; no chest pain, no further episode of nausea or vomiting.  Tolerating some liquid diet. Respiratory system: Clear to  auscultation. Respiratory effort normal.  No requiring oxygen supplementation. Cardiovascular system: Rate controlled; no rubs, no gallops, no JVD on exam. Gastrointestinal system: Abdomen is nondistended, soft and nontender. No organomegaly or masses felt. Normal bowel sounds heard. Central nervous system: Alert and oriented. No new focal neurological deficits. Extremities: No cyanosis or clubbing. Skin: No petechiae. Psychiatry: Flat affect.    Data Reviewed: I have personally reviewed following labs and imaging studies  CBC: Recent Labs  Lab 06/17/21 1135 06/18/21 0331  WBC 10.9* 9.4  NEUTROABS 8.2*  --   HGB 11.5* 10.4*  HCT 37.2 32.9*  MCV 101.4* 98.2  PLT 206 149*    Basic Metabolic Panel: Recent Labs  Lab 06/17/21 1135 06/17/21 2139 06/18/21 0331  NA 135 143 139  K 3.2* 3.7 3.3*  CL 104 108 104  CO2 14* 18* 21*  GLUCOSE 75 96 149*  BUN 20 21 18   CREATININE 1.10* 1.04* 0.98  CALCIUM 8.7* 8.1* 7.6*    GFR: Estimated Creatinine Clearance: 23.7 mL/min (by C-G formula based on SCr of 0.98 mg/dL).  Liver Function Tests: Recent Labs  Lab 06/17/21 1135  AST 30  ALT 16  ALKPHOS 83  BILITOT 1.2  PROT 5.7*  ALBUMIN 2.8*    CBG: No results for input(s): GLUCAP in the last 168 hours.   Recent Results (from the past 240 hour(s))  Culture, blood (routine x 2)     Status: None (Preliminary result)   Collection Time: 06/17/21 11:35 AM   Specimen: BLOOD  Result Value Ref Range Status   Specimen Description BLOOD RIGHT WRIST  Final   Special Requests   Final    BOTTLES DRAWN AEROBIC AND ANAEROBIC Blood Culture adequate volume   Culture   Final    NO GROWTH < 24 HOURS Performed at [facility], [address] [place], [place]    Report Status PENDING  Incomplete  Culture, blood (routine x 2)     Status: None (Preliminary result)   Collection Time: 06/17/21  2:42 PM   Specimen: BLOOD  Result Value Ref Range Status   Specimen Description BLOOD  RIGHT ARM  Final   Special Requests   Final    BOTTLES DRAWN AEROBIC AND ANAEROBIC Blood Culture adequate volume   Culture   Final    NO GROWTH < 24 HOURS Performed at [facility], [address] [place], [place]    Report Status PENDING  Incomplete  Resp Panel by RT-PCR (Flu A&B, Covid) Nasopharyngeal Swab     Status: None   Collection Time: 06/17/21  4:30 PM   Specimen: Nasopharyngeal Swab; Nasopharyngeal(NP) swabs in vial transport medium  Result Value Ref Range Status   SARS Coronavirus 2 by RT PCR NEGATIVE NEGATIVE Final    Comment: (NOTE) SARS-CoV-2 target nucleic acids are NOT DETECTED.  The SARS-CoV-2 RNA is generally detectable in upper respiratory specimens during the acute phase of infection. The lowest concentration of SARS-CoV-2 viral copies this assay can detect is 138 copies/mL. A negative result does not preclude SARS-Cov-2 infection and should not be used as the sole basis for treatment or other patient  management decisions. A negative result may occur with  improper specimen collection/handling, submission of specimen other than nasopharyngeal swab, presence of viral mutation(s) within the areas targeted by this assay, and inadequate number of viral copies(<138 copies/mL). A negative result must be combined with clinical observations, patient history, and epidemiological information. The expected result is Negative.  Fact Sheet for Patients:  BloggerCourse.com  Fact Sheet for Healthcare Providers:  SeriousBroker.it  This test is no t yet approved or cleared by the [place] FDA and  has been authorized for detection and/or diagnosis of SARS-CoV-2 by FDA under an Emergency Use Authorization (EUA). This EUA will remain  in effect (meaning this test can be used) for the duration of the COVID-19 declaration under Section 564(b)(1) of the Act, 21 U.S.C.section 360bbb-3(b)(1), unless the authorization  is terminated  or revoked sooner.       Influenza A by PCR NEGATIVE NEGATIVE Final   Influenza B by PCR NEGATIVE NEGATIVE Final    Comment: (NOTE) The Xpert Xpress SARS-CoV-2/FLU/RSV plus assay is intended as an aid in the diagnosis of influenza from Nasopharyngeal swab specimens and should not be used as a sole basis for treatment. Nasal washings and aspirates are unacceptable for Xpert Xpress SARS-CoV-2/FLU/RSV testing.  Fact Sheet for Patients: BloggerCourse.com  Fact Sheet for Healthcare Providers: SeriousBroker.it  This test is not yet approved or cleared by the [place] FDA and has been authorized for detection and/or diagnosis of SARS-CoV-2 by FDA under an Emergency Use Authorization (EUA). This EUA will remain in effect (meaning this test can be used) for the duration of the COVID-19 declaration under Section 564(b)(1) of the Act, 21 U.S.C. section 360bbb-3(b)(1), unless the authorization is terminated or revoked.  Performed at [facility], [address], [place], [place] [address]   MRSA Next Gen by PCR, Nasal     Status: None   Collection Time: 06/17/21 11:00 PM   Specimen: Nasal Mucosa; Nasal Swab  Result Value Ref Range Status   MRSA by PCR Next Gen NOT DETECTED NOT DETECTED Final    Comment: (NOTE) The GeneXpert MRSA Assay (FDA approved for NASAL specimens only), is one component of a comprehensive MRSA colonization surveillance program. It is not intended to diagnose MRSA infection nor to guide or monitor treatment for MRSA infections. Test performance is not FDA approved in patients less than 29 years old. Performed at [facility], [address] [place], [place] [address]     Radiology Studies: DG Chest Portable 1 View  Result Date: 06/17/2021 CLINICAL DATA:  Weakness. Nausea vomiting. History of coronary artery disease and hypertension. EXAM: PORTABLE CHEST 1 VIEW COMPARISON:  Chest x-ray  08/12/2020, CT chest 05/22/2019 FINDINGS: The heart size and mediastinal contours are unchanged. Aortic valve replacement. Coronary artery stents. Aortic calcification. Left hilar calcified lymph nodes. Calcified bronchial walls again noted which is most more prominent due to patient rotation in the left lung. Biapical pleural/pulmonary scarring with increased airspace opacity at the left apex which may be due to patient rotation. Hyperinflation of the lungs. No focal consolidation. Coarsened interstitial markings with no overt pulmonary edema. No pleural effusion. No pneumothorax. No acute osseous abnormality. IMPRESSION: 1. Biapical pleural/pulmonary scarring with increased airspace opacity at the left apex which may be due to patient rotation. Recommend CT chest for further evaluation. 2. Aortic Atherosclerosis (ICD10-I70.0) and Emphysema (ICD10-J43.9). Electronically Signed   By: Tish Frederickson M.D.   On: 06/17/2021 16:12    Scheduled Meds:  apixaban  2.5 mg Oral  BID   aspirin EC  81 mg Oral Daily   Chlorhexidine Gluconate Cloth  6 each Topical Daily   cholecalciferol  4,000 Units Oral Daily   feeding supplement  237 mL Oral BID BM   mirtazapine  7.5 mg Oral QHS   multivitamin with minerals  1 tablet Oral Daily   potassium chloride  10 mEq Oral BID   rosuvastatin  20 mg Oral Daily   saccharomyces boulardii  250 mg Oral BID   vitamin B-12  1,000 mcg Oral Daily   Continuous Infusions:  cefTRIAXone (ROCEPHIN)  IV Stopped (06/18/21 0622)   sodium bicarbonate 150 mEq in D5W infusion 75 mL/hr at 06/18/21 1518     LOS: 1 day    Time spent: 35 minutes    Vassie Lollarlos Analeya Luallen, MD [facility]   To contact the attending provider between 7A-7P or the covering provider during after hours 7P-7A, please log into the web site [email or web address] and access using universal [place] password for that web site. If you do not have the password, please call the hospital operator.  06/18/2021, 6:20 PM

## 2021-06-18 NOTE — Progress Notes (Signed)
Patient's son visiting at bedside. IV pump alarming infusion completed. This RN went to pyxis to get a new bag to change out infusion, one was not present. Son came into the doorway and said to staff in a loud voice "someone needs to cut this sound off now before I get mad, and you dont want me to get mad. it is bothering my momma"   House supervisor Charlane Ferretti Morrie Sheldon in hallway, Security was called and Publishing copy called to speak with family member.

## 2021-06-18 NOTE — Progress Notes (Addendum)
Patient agreeable to take "important" medications with daughter at bedside. Patient Did not take all of her PO morning medications but agreed to take aspirin, crestor, one vitamin D pill, eliquis, potassium. Patient drank some sips of a shasta cola. Daughter at bedside states patient hasn't eaten in over a year and that patient thinks that two bites of food is a meal.

## 2021-06-19 DIAGNOSIS — Z7189 Other specified counseling: Secondary | ICD-10-CM | POA: Diagnosis not present

## 2021-06-19 DIAGNOSIS — E872 Acidosis: Secondary | ICD-10-CM | POA: Diagnosis not present

## 2021-06-19 DIAGNOSIS — E860 Dehydration: Secondary | ICD-10-CM | POA: Diagnosis not present

## 2021-06-19 DIAGNOSIS — I482 Chronic atrial fibrillation, unspecified: Secondary | ICD-10-CM | POA: Diagnosis not present

## 2021-06-19 DIAGNOSIS — R627 Adult failure to thrive: Secondary | ICD-10-CM | POA: Diagnosis not present

## 2021-06-19 LAB — BASIC METABOLIC PANEL
Anion gap: 8 (ref 5–15)
BUN: 10 mg/dL (ref 8–23)
CO2: 30 mmol/L (ref 22–32)
Calcium: 7.9 mg/dL — ABNORMAL LOW (ref 8.9–10.3)
Chloride: 101 mmol/L (ref 98–111)
Creatinine, Ser: 0.64 mg/dL (ref 0.44–1.00)
GFR, Estimated: >60 mL/min (ref >60)
Glucose, Bld: 109 mg/dL — ABNORMAL HIGH (ref 70–99)
Potassium: 2.8 mmol/L — ABNORMAL LOW (ref 3.5–5.1)
Sodium: 139 mmol/L (ref 135–145)

## 2021-06-19 LAB — MAGNESIUM: Magnesium: 2.1 mg/dL (ref 1.7–2.4)

## 2021-06-19 MED ORDER — ENSURE ENLIVE PO LIQD: 237.0000 mL | Freq: Two times a day (BID) | ORAL | Status: AC

## 2021-06-19 MED ORDER — SODIUM BICARBONATE 8.4 % IV SOLN
INTRAVENOUS | Status: AC
  Filled 2021-06-19: qty 150

## 2021-06-19 NOTE — Progress Notes (Signed)
5364 - tele reading HR of 25-32 bpm. In to assess pt, pt sleeping, skin warm and dry. Opens eyes to name called but back to sleep with no verbal response. Apical heartrate counted at 68 bpm, irregular. B/P 126/65. Resp even and non-labored. No distress noted. Right arm noted to be slightly edematous, IV board (which was secured with ace wrap) removed. IV site in right [facility] noted to be leaking when flushed. Dressing removed and IV continues to lead with flush so IV removed.  Second IV site in right forearm redressed, flushes easily with no s/s infiltration, continues with IV infusion per order. 69: Tele continues to alarm low heart rates in 30-40 range but second apical count is 62 bpm.

## 2021-06-19 NOTE — Progress Notes (Signed)
Daily Progress Note   Patient Name: Catherine Anthony       Date: 06/19/2021 DOB: [birth year]  Age: 87 y.o. MRN#: [id number] Attending Physician: Vassie Loll, MD Primary Care Physician: [name], MD Admit Date: 06/17/2021  Reason for Consultation/Follow-up: Establishing goals of care  Subjective: Patient is more awake this morning. Her son, Chanetta Marshall, and daughter in law- Diane are at bedside.  She does not recall eating breakfast and requests a bacon and egg biscuit from her son. I was able to ask patient about her feelings about ongoing life-prolonging interventions and rehospitalization.  I discussed with patient concerns related to her failure to thrive, not eating, not drinking- and how her body will continue to decline and she will likely have recurrent UTI or other serious illness that will lead to her need to coming back to the hospital in the near future.  Options for ongoing life-prolonging care, rehospitalization vs comfort measures was discussed. At this point Dennie Bible states that she would want to return to the hospital and attempt to treat any treatable illness.  I encouraged her and her family to continue these discussions. Patient and family were agreeable to referral to outpatient Palliative to continue to follow patient and assist with goals of care.   Review of Systems  Constitutional:  Positive for weight loss.  Gastrointestinal:  Negative for abdominal pain, diarrhea, nausea and vomiting.  Neurological:  Positive for focal weakness.   Length of Stay: 2  Current Medications: Scheduled Meds:   apixaban  2.5 mg Oral BID   aspirin EC  81 mg Oral Daily   Chlorhexidine Gluconate Cloth  6 each Topical Daily   cholecalciferol  4,000 Units Oral Daily   feeding supplement  237 mL  Oral BID BM   mirtazapine  7.5 mg Oral QHS   multivitamin with minerals  1 tablet Oral Daily   potassium chloride  10 mEq Oral BID   rosuvastatin  20 mg Oral Daily   saccharomyces boulardii  250 mg Oral BID   vitamin B-12  1,000 mcg Oral Daily    Continuous Infusions:  cefTRIAXone (ROCEPHIN)  IV 1 g (06/19/21 0528)   sodium bicarbonate 150 mEq in D5W infusion 75 mL/hr at 06/19/21 0332    PRN Meds: acetaminophen **OR** acetaminophen, nitroGLYCERIN, prochlorperazine  Physical Exam Vitals and nursing  note reviewed.  Constitutional:      Comments: Awake, thin, frail  Neurological:     Comments: L upper extremity hemiparesis            Vital Signs: BP 126/65 (BP Location: Left Arm)   Pulse 68   Temp 97.8 F (36.6 C) (Oral)   Resp 14   Ht 5\' 4"  (1.626 m)   Wt 37.9 kg   SpO2 95%   BMI 14.34 kg/m  SpO2: SpO2: 95 % O2 Device: O2 Device: Room Air O2 Flow Rate:    Intake/output summary:  Intake/Output Summary (Last 24 hours) at 06/19/2021 1117 Last data filed at 06/19/2021 0500 Gross per 24 hour  Intake 936.35 ml  Output 700 ml  Net 236.35 ml   LBM:   Baseline Weight: Weight: 37.5 kg Most recent weight: Weight: 37.9 kg       Palliative Assessment/Data: PPS: 20%      Patient Active Problem List   Diagnosis Date Noted   Chronic a-fib (HCC) 06/17/2021   Metabolic acidosis 06/17/2021   End of life care 06/12/2021   Protein-calorie malnutrition, severe 08/15/2020   Severe sepsis (HCC) 08/15/2020   Acute gastroenteritis 08/15/2020   Nausea & vomiting 08/13/2020   Lactic acidosis 08/13/2020   Hyperglycemia 08/13/2020   Dehydration 08/13/2020   AKI (acute kidney injury) (HCC) 08/13/2020   Elevated troponin 08/13/2020   Prolonged QT interval 08/13/2020   Diarrhea 08/13/2020   Open wound of left heel 08/13/2020   Hypotension 08/13/2020   Hypothermia 08/13/2020   Generalized weakness 08/12/2020   Ischemic stroke (HCC) 07/25/2020   Left-sided weakness 07/24/2020    Acute left-sided weakness    Hypokalemia    Leg edema, left 12/11/2019   Frequent unifocal PVCs 08/09/2019   Indication present for endocarditis prophylaxis 08/09/2019   Mixed hyperlipidemia 08/01/2019   Acute on chronic diastolic heart failure (HCC) 06/06/2019   S/P TAVR (transcatheter aortic valve replacement) 06/06/2019   Essential hypertension    High cholesterol    Coronary artery disease    PAF (paroxysmal atrial fibrillation) (HCC)    Moderate aortic regurgitation    Coronary artery disease with stable angina pectoris (HCC) 08/31/2017    Palliative Care Assessment & Plan   Patient Profile:  87 y.o. female  with past medical history of CVA with residual L sided weakness, recurrent UTIs, a-fib, CAD, diastolic CHF admitted on 06/17/2021 with sepsis presumed r/t UTI. She is found to not be thriving, having significant weight loss and poor po intake. Palliative medicine consulted for "advanced directives and GOC, patient is already DNR/DNR, doing poorly with ongoing failure to thrive for weeks prior to admission".    Assessment/Recommendations/Plan  TOC referral for outpatient Palliative  Goals of Care and Additional Recommendations: Limitations on Scope of Treatment: No Artificial Feeding  Code Status: DNR  Prognosis:  < 6 months likely due to ongoing failure to thrive with poor po intake, recurrent UTI's with recent sepsis all in the setting of history of CVA  Discharge Planning: Home with Palliative Services  Care plan was discussed with patient and family.  Thank you for allowing the Palliative Medicine Team to assist in the care of this patient.   Total time: 42 minutes Greater than 50%  of this time was spent counseling and coordinating care related to the above assessment and plan.  06/19/2021, AGNP-C Palliative Medicine   Please contact Palliative Medicine Team phone at [phone] for questions and concerns.

## 2021-06-19 NOTE — Discharge Summary (Signed)
Physician Discharge Summary  Catherine Anthony QHU:[id number] DOB: 1930-04-24 DOA: 06/17/2021  PCP: [name], MD  Admit date: 06/17/2021 Discharge date: 06/19/2021  Time spent: 35 minutes  Recommendations for Outpatient Follow-up:  Repeat basic metabolic panel to follow lites and renal function Continue further discussion of advance care planning and transition to hospice care and comfort management based on patient's further clinical response.   Discharge Diagnoses:  Active Problems:   Chronic a-fib (HCC)   Metabolic acidosis   Failure to thrive in adult Hypokalemia Severe protein calorie malnutrition Hypertension  Discharge Condition: Stable and improved.  Discharged home with instruction to follow-up with PCP in 10 days.  CODE STATUS: DNR.  Diet recommendation: Regular diet.  Filed Weights   06/17/21 1127 06/17/21 2328  Weight: 37.5 kg 37.9 kg    History of present illness:  Catherine Anthony is an 87 y.o. 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  Patient presented to the emergency room accompanied by daughter who is at bedside.  Patient apparently has had poor oral intake for several weeks.  She was diagnosed with urinary tract infection and has had multiple antibiotics at home without improvement.  As per daughter, patient condition continues to deteriorate.  Patient is a poor historian has history was limited.  She denies any pain at this time.  Denies any shortness of breath.  Denies any fever or chills.  Denies any abdominal pain.  She has had episodes of loose stools but no obvious diarrhea or bloody stools.   In the ED, work-up showed evidence of hypotension.  Patient was noted to be lethargic and dehydrated.  Labs came back showing abnormal BMP with lactic acidosis and metabolic acidosis.  Urinalysis still pending at this time.  Hospital Course:   1-sepsis with hypotension and suspected E. Coli UTI -Improvement in patient blood pressure appreciated after fluid resuscitation -patient denies dysuria, no nausea, no vomiting -Currently afebrile -treated with 3 days of IV rocephin  -advise to maintain adequate hydraiton  -Continue supportive care.   2-failure to thrive/severe protein calorie malnutrition -Patient has expressed not wanting feeding tube -Continue to incentive oral intake and feeding supplements -Body mass index is 14.34 kg/m. -Appreciate nutritionist/dietitian involvement. -Palliative care will follow patient after discharge   3-chronic paroxysmal small atrial fibrillation -Rate controlled at this time -During most recently sent to cardiologist minimizing medication burden metoprololAnd amiodarone were discontinued. -Continue Eliquis; no overt bleeding seen   4-hyperlipidemia -Continue statins.   5-hypokalemia in the setting of poor oral intake -Continue repletion and follow trend.   6-acute metabolic acidosis and elevated lactate -With concerns of severe dehydration and suspected UTI -Completed empirical antibiotic therapy, (3 days of rocephin) -continue to encourage proper hydration and nutrition  -follow response -patient declined feeding tube  7-hypokalemia -repleted -patient discharge on daily maintenance supplementation -repeat BMET at follow up visit.   Procedures: See below for x-ray reports. Consultations: Palliative care  Discharge Exam: Vitals:   06/19/21 0735 06/19/21 1118  BP: 126/65   Pulse: 68 92  Resp: 14   Temp:    SpO2: 95%     General: Afebrile, no chest pain, no nausea, no vomiting.  Unchanged left-sided hemiparesis.  In no major distress.  Patient would like to go home. Cardiovascular: Rate controlled, no rubs, no gallops, no JVD. Respiratory: Good air movement bilaterally; no requiring oxygen supplementation.  No wheezing or crackles on exam. Abdomen:  Soft, nontender,  positive bowel sounds Extremities: No cyanosis or clubbing.  Discharge Instructions   Discharge Instructions     Diet - low sodium heart healthy   Complete by: As directed    Discharge instructions   Complete by: As directed    Take medications as prescribed Maintain adequate hydration Continue encouragement for oral intake and proper nutrition Follow-up with PCP in 10 days      Allergies as of 06/19/2021       Reactions   Codeine Nausea And Vomiting        Medication List     STOP taking these medications    amoxicillin 500 MG capsule Commonly known as: AMOXIL       TAKE these medications    apixaban 2.5 MG Tabs tablet Commonly known as: Eliquis TAKE (1) TABLET BY MOUTH TWICE DAILY. What changed:  how much to take how to take this when to take this additional instructions   Cranberry 360 MG Caps Take 36 mg by mouth.   EYE DROPS ALLERGY RELIEF OP Place 1 drop into both eyes 3 (three) times daily as needed (allergy).   feeding supplement Liqd Take 237 mLs by mouth 2 (two) times daily between meals.   mirtazapine 15 MG tablet Commonly known as: REMERON TAKE 1/2 TABLET BY MOUTH AT BEDTIME.   multivitamin with minerals tablet Take 1 tablet by mouth daily.   nitroGLYCERIN 0.4 MG SL tablet Commonly known as: NITROSTAT Place 1 tablet (0.4 mg total) under the tongue every 5 (five) minutes x 3 doses as needed for chest pain.   rosuvastatin 20 MG tablet Commonly known as: CRESTOR Take 20 mg by mouth daily.   saccharomyces boulardii 250 MG capsule Commonly known as: FLORASTOR Take 1 capsule (250 mg total) by mouth 2 (two) times daily.   vitamin B-12 1000 MCG tablet Commonly known as: CYANOCOBALAMIN Take 1,000 mcg by mouth daily.   Vitamin D3 50 MCG (2000 UT) Tabs Take 2 tablets by mouth daily.       ASK your doctor about these medications    potassium chloride 10 MEQ tablet Commonly known as: KLOR-CON Take 10 mEq by mouth 2 (two) times  daily.       Allergies  Allergen Reactions   Codeine Nausea And Vomiting    Follow-up Information     [name], MD. Schedule an appointment as soon as possible for a visit in 10 day(s).   Specialty: Internal Medicine Contact information: [address] [place] [address] [place] [address] [phone]                 The results of significant diagnostics from this hospitalization (including imaging, microbiology, ancillary and laboratory) are listed below for reference.    Significant Diagnostic Studies: DG Chest Portable 1 View  Result Date: 06/17/2021 CLINICAL DATA:  Weakness. Nausea vomiting. History of coronary artery disease and hypertension. EXAM: PORTABLE CHEST 1 VIEW COMPARISON:  Chest x-ray 08/12/2020, CT chest 05/22/2019 FINDINGS: The heart size and mediastinal contours are unchanged. Aortic valve replacement. Coronary artery stents. Aortic calcification. Left hilar calcified lymph nodes. Calcified bronchial walls again noted which is most more prominent due to patient rotation in the left lung. Biapical pleural/pulmonary scarring with increased airspace opacity at the left apex which may be due to patient rotation. Hyperinflation of the lungs. No focal consolidation. Coarsened interstitial markings with no overt pulmonary edema. No pleural effusion. No pneumothorax. No acute osseous abnormality. IMPRESSION: 1. Biapical pleural/pulmonary scarring with increased airspace  opacity at the left apex which may be due to patient rotation. Recommend CT chest for further evaluation. 2. Aortic Atherosclerosis (ICD10-I70.0) and Emphysema (ICD10-J43.9). Electronically Signed   By: Tish FredericksonMorgane  Naveau M.D.   On: 06/17/2021 16:12    Microbiology: Recent Results (from the past 240 hour(s))  Culture, blood (routine x 2)     Status: None (Preliminary result)   Collection Time: 06/17/21 11:35 AM   Specimen: BLOOD  Result Value Ref Range Status   Specimen Description BLOOD RIGHT  WRIST  Final   Special Requests   Final    BOTTLES DRAWN AEROBIC AND ANAEROBIC Blood Culture adequate volume   Culture   Final    NO GROWTH 2 DAYS Performed at [facility], [address] [place], [place] [address]    Report Status PENDING  Incomplete  Culture, blood (routine x 2)     Status: None (Preliminary result)   Collection Time: 06/17/21  2:42 PM   Specimen: BLOOD  Result Value Ref Range Status   Specimen Description BLOOD RIGHT ARM  Final   Special Requests   Final    BOTTLES DRAWN AEROBIC AND ANAEROBIC Blood Culture adequate volume   Culture   Final    NO GROWTH 2 DAYS Performed at [facility], [address] [place], [place] [address]    Report Status PENDING  Incomplete  Resp Panel by RT-PCR (Flu A&B, Covid) Nasopharyngeal Swab     Status: None   Collection Time: 06/17/21  4:30 PM   Specimen: Nasopharyngeal Swab; Nasopharyngeal(NP) swabs in vial transport medium  Result Value Ref Range Status   SARS Coronavirus 2 by RT PCR NEGATIVE NEGATIVE Final    Comment: (NOTE) SARS-CoV-2 target nucleic acids are NOT DETECTED.  The SARS-CoV-2 RNA is generally detectable in upper respiratory specimens during the acute phase of infection. The lowest concentration of SARS-CoV-2 viral copies this assay can detect is 138 copies/mL. A negative result does not preclude SARS-Cov-2 infection and should not be used as the sole basis for treatment or other patient management decisions. A negative result may occur with  improper specimen collection/handling, submission of specimen other than nasopharyngeal swab, presence of viral mutation(s) within the areas targeted by this assay, and inadequate number of viral copies(<138 copies/mL). A negative result must be combined with clinical observations, patient history, and epidemiological information. The expected result is Negative.  Fact Sheet for Patients:  BloggerCourse.comhttps://www.fda.gov/media/152166/download  Fact Sheet for Healthcare Providers:   SeriousBroker.ithttps://www.fda.gov/media/152162/download  This test is no t yet approved or cleared by the [place] FDA and  has been authorized for detection and/or diagnosis of SARS-CoV-2 by FDA under an Emergency Use Authorization (EUA). This EUA will remain  in effect (meaning this test can be used) for the duration of the COVID-19 declaration under Section 564(b)(1) of the Act, 21 U.S.C.section 360bbb-3(b)(1), unless the authorization is terminated  or revoked sooner.       Influenza A by PCR NEGATIVE NEGATIVE Final   Influenza B by PCR NEGATIVE NEGATIVE Final    Comment: (NOTE) The Xpert Xpress SARS-CoV-2/FLU/RSV plus assay is intended as an aid in the diagnosis of influenza from Nasopharyngeal swab specimens and should not be used as a sole basis for treatment. Nasal washings and aspirates are unacceptable for Xpert Xpress SARS-CoV-2/FLU/RSV testing.  Fact Sheet for Patients: BloggerCourse.comhttps://www.fda.gov/media/152166/download  Fact Sheet for Healthcare Providers: SeriousBroker.ithttps://www.fda.gov/media/152162/download  This test is not yet approved or cleared by the [place] FDA and has been authorized for detection and/or diagnosis of  SARS-CoV-2 by FDA under an Emergency Use Authorization (EUA). This EUA will remain in effect (meaning this test can be used) for the duration of the COVID-19 declaration under Section 564(b)(1) of the Act, 21 U.S.C. section 360bbb-3(b)(1), unless the authorization is terminated or revoked.  Performed at [facility], [address] [place], [place] [address]   Urine Culture     Status: Abnormal (Preliminary result)   Collection Time: 06/17/21 10:09 PM   Specimen: Urine, Catheterized  Result Value Ref Range Status   Specimen Description   Final    URINE, CATHETERIZED Performed at [facility], [address], [place], [place] [address]    Special Requests   Final    NONE Performed at [facility], [address] [place], [place] [address]    Culture (A)   Final    30,000 COLONIES/mL ESCHERICHIA COLI SUSCEPTIBILITIES TO FOLLOW Performed at [facility], [address], [place], [place] [address]    Report Status PENDING  Incomplete  MRSA Next Gen by PCR, Nasal     Status: None   Collection Time: 06/17/21 11:00 PM   Specimen: Nasal Mucosa; Nasal Swab  Result Value Ref Range Status   MRSA by PCR Next Gen NOT DETECTED NOT DETECTED Final    Comment: (NOTE) The GeneXpert MRSA Assay (FDA approved for NASAL specimens only), is one component of a comprehensive MRSA colonization surveillance program. It is not intended to diagnose MRSA infection nor to guide or monitor treatment for MRSA infections. Test performance is not FDA approved in patients less than 64 years old. Performed at [facility], [address] [place], [place] [address]      Labs: Basic Metabolic Panel: Recent Labs  Lab 06/17/21 1135 06/17/21 2139 06/18/21 0331 06/19/21 0352  NA 135 143 139 139  K 3.2* 3.7 3.3* 2.8*  CL 104 108 104 101  CO2 14* 18* 21* 30  GLUCOSE 75 96 149* 109*  BUN 20 21 18 10   CREATININE 1.10* 1.04* 0.98 0.64  CALCIUM 8.7* 8.1* 7.6* 7.9*  MG  --   --   --  2.1   Liver Function Tests: Recent Labs  Lab 06/17/21 1135  AST 30  ALT 16  ALKPHOS 83  BILITOT 1.2  PROT 5.7*  ALBUMIN 2.8*   CBC: Recent Labs  Lab 06/17/21 1135 06/18/21 0331  WBC 10.9* 9.4  NEUTROABS 8.2*  --   HGB 11.5* 10.4*  HCT 37.2 32.9*  MCV 101.4* 98.2  PLT 206 149*   Signed:  06/20/21 MD.  [facility] 06/19/2021, 2:29 PM

## 2021-06-19 NOTE — Consult Note (Signed)
Consultation Note Date: 06/19/2021   Patient Name: Catherine Anthony  DOB: 10-24-1930  MRN: [id number]  Age / Sex: 87 y.o., female  PCP: Deland Pretty, MD Referring Physician: Barton Dubois, MD  Reason for Consultation: Establishing goals of care  HPI/Patient Profile: 87 y.o. female  with past medical history of CVA with residual L sided weakness, recurrent UTIs, a-fib, CAD, diastolic CHF admitted on 6/64/4034 with sepsis presumed r/t UTI. She is found to not be thriving, having significant weight loss and poor po intake. Palliative medicine consulted for "advanced directives and GOC, patient is already DNR/DNR, doing poorly with ongoing failure to thrive for weeks prior to admission".    Clinical Assessment and Goals of Care: I evaluated the patient, she is lethargic, does not open her eyes during discussion.  Met with daughter- [name] at the bedside.  Patient lives with Tessie Fass and relies on [place] for total care. She is bedbound.  [name] states that patient has had decreasing oral intake since her stroke in August.  She notes that patient will eat a few bites and feel full. She declines most fluids saying they are "too sweet".  [name] shares that "Fraser Din" was previously very independent and raised several children. [name] has a sister and two brothers who are involved in decision making for patient.  We discussed Pat's current illness. [name] was surprised at the seriousness of the illness and the possibility that patient may be approaching the end stages of her life.  Discussed with [name] the difficulty in recovering from serious illness in a state of poor nutrition. Also discussed that without eating and drinking- she will likely have recurrence of UTI's and sepsis in the near future.  Goals of care were discussed- [name] stated that her continues to say she wants to go home- [name] does not think her mom is saying that  she wants to go home and stop the current interventions that are prolonging her life. She thinks that her Mom is meaning that she wants to treat the current illness- continue to treat what is treatable- and then be able to go back home.  [name] states that patient has set limits in her care- DNR, and would not want a feeding tube.  We discussed future illness and rehospitalization and "going through all of this" again. [name] isn't certain that her mom would choose to return to the hospital again.  Options for hospice support at home were presented. [name] stated that her mom was adamantly against Hospice and hated even the word Hospice.  At close of discussion- [name] shared that she is hopeful that her mom's mental status will improve and she will be able to further discuss her wishes with her. She also would need to have discussions with her siblings regarding any important medical decisions.   Primary Decision Maker NEXT OF KIN - patient's children    SUMMARY OF RECOMMENDATIONS -Continue current scope- current GOC would be attempt to stabilize patient to go home- never a SNF- resistant to Hospice -No feeding tube -Offered  to meet with all siblings- will check back with [name] regarding this -Will monitor for patient's ability to interact and discuss her goals of care    Code Status/Advance Care Planning: DNR  Prognosis:   < 6 months likely due to significant failure to thrive in the setting of protein calorie malnutrition s/p CVA  Discharge Planning: To Be Determined  Primary Diagnoses: Present on Admission:  Chronic a-fib ([place])  Metabolic acidosis   I have reviewed the medical record, interviewed the patient and family, and examined the patient. The following aspects are pertinent.  Past Medical History:  Diagnosis Date   Arthritis    Right hand   Complication of anesthesia    Coronary artery disease    s/p mutliple PCIs   Dyslipidemia    Family hx of colon cancer    History  of kidney stones    Hypertension    PAF (paroxysmal atrial fibrillation) (HCC)    on Eliquis   S/P TAVR (transcatheter aortic valve replacement)    Severe aortic stenosis    Social History   Socioeconomic History   Marital status: Widowed    Spouse name: Not on file   Number of children: 5   Years of education: Not on file   Highest education level: Not on file  Occupational History   Not on file  Tobacco Use   Smoking status: Never   Smokeless tobacco: Never  Vaping Use   Vaping Use: Never used  Substance and Sexual Activity   Alcohol use: No   Drug use: No   Sexual activity: Not on file  Other Topics Concern   Not on file  Social History Narrative   1 son deceased   Social Determinants of Health   Financial Resource Strain: Not on file  Food Insecurity: Not on file  Transportation Needs: Not on file  Physical Activity: Not on file  Stress: Not on file  Social Connections: Not on file   Scheduled Meds:  apixaban  2.5 mg Oral BID   aspirin EC  81 mg Oral Daily   Chlorhexidine Gluconate Cloth  6 each Topical Daily   cholecalciferol  4,000 Units Oral Daily   feeding supplement  237 mL Oral BID BM   mirtazapine  7.5 mg Oral QHS   multivitamin with minerals  1 tablet Oral Daily   potassium chloride  10 mEq Oral BID   rosuvastatin  20 mg Oral Daily   saccharomyces boulardii  250 mg Oral BID   vitamin B-12  1,000 mcg Oral Daily   Continuous Infusions:  cefTRIAXone (ROCEPHIN)  IV 1 g (06/19/21 0528)   sodium bicarbonate 150 mEq in D5W infusion 75 mL/hr at 06/19/21 0332   PRN Meds:.acetaminophen **OR** acetaminophen, nitroGLYCERIN, prochlorperazine Medications Prior to Admission:  Prior to Admission medications   Medication Sig Start Date End Date Taking? Authorizing Provider  apixaban (ELIQUIS) 2.5 MG TABS tablet TAKE (1) TABLET BY MOUTH TWICE DAILY. Patient taking differently: Take 2.5 mg by mouth 2 (two) times daily. 08/01/20  Yes Medina-Vargas, Monina C, NP   Cholecalciferol (VITAMIN D3) 50 MCG (2000 UT) TABS Take 2 tablets by mouth daily.    Yes [provider]  Cranberry 360 MG CAPS Take 36 mg by mouth.   Yes [provider]  mirtazapine (REMERON) 15 MG tablet TAKE 1/2 TABLET BY MOUTH AT BEDTIME. 08/28/20  Yes Patwardhan, Manish J, MD  Multiple Vitamins-Minerals (MULTIVITAMIN WITH MINERALS) tablet Take 1 tablet by mouth daily.   Yes  [provider]  nitroGLYCERIN (NITROSTAT) 0.4 MG SL tablet Place 1 tablet (0.4 mg total) under the tongue every 5 (five) minutes x 3 doses as needed for chest pain. 08/01/20  Yes Medina-Vargas, Monina C, NP  rosuvastatin (CRESTOR) 20 MG tablet Take 20 mg by mouth daily.   Yes [provider]  Tetrahydrozoline-Zn Sulfate (EYE DROPS ALLERGY RELIEF OP) Place 1 drop into both eyes 3 (three) times daily as needed (allergy).    Yes [provider]  vitamin B-12 (CYANOCOBALAMIN) 1000 MCG tablet Take 1,000 mcg by mouth daily.   Yes [provider]  amoxicillin (AMOXIL) 500 MG capsule Take 500 mg by mouth 3 (three) times daily.    [provider]  potassium chloride (KLOR-CON) 10 MEQ tablet Take 10 mEq by mouth 2 (two) times daily. Patient not taking: No sig reported    [provider]  saccharomyces boulardii (FLORASTOR) 250 MG capsule Take 1 capsule (250 mg total) by mouth 2 (two) times daily. Patient not taking: No sig reported 08/15/20   Terrilee Croak, MD   Allergies  Allergen Reactions   Codeine Nausea And Vomiting   Review of Systems  Unable to perform ROS: Acuity of condition   Physical Exam Vitals and nursing note reviewed.  Constitutional:      Appearance: She is ill-appearing.     Comments: Frail, cachetic  Pulmonary:     Effort: Pulmonary effort is normal.  Neurological:     Comments: Lethargic, unable to assess orientation, nonverbal    Vital Signs: BP 126/65 (BP Location: Left Arm)   Pulse 68   Temp 97.8 F (36.6 C) (Oral)   Resp  14   Ht 5' 4" (1.626 m)   Wt 37.9 kg   SpO2 95%   BMI 14.34 kg/m  Pain Scale: PAINAD POSS *See Group Information*: S-Acceptable,Sleep, easy to arouse Pain Score: 0-No pain   SpO2: SpO2: 95 % O2 Device:SpO2: 95 % O2 Flow Rate: .   IO: Intake/output summary:  Intake/Output Summary (Last 24 hours) at 06/19/2021 0914 Last data filed at 06/19/2021 0500 Gross per 24 hour  Intake 1208.17 ml  Output 700 ml  Net 508.17 ml    LBM:   Baseline Weight: Weight: 37.5 kg Most recent weight: Weight: 37.9 kg     Palliative Assessment/Data: PPS: 20%     Thank you for this consult. Palliative medicine will continue to follow and assist as needed.   Time In: 1604 Time Out: 1714 Time Total: 70 minutes Greater than 50%  of this time was spent counseling and coordinating care related to the above assessment and plan.  Signed by: Mariana Kaufman, AGNP-C Palliative Medicine    Please contact Palliative Medicine Team phone at [phone] for questions and concerns.  For individual provider: See Shea Evans

## 2021-06-20 LAB — URINE CULTURE: Culture: 30000 — AB

## 2021-06-22 DIAGNOSIS — E785 Hyperlipidemia, unspecified: Secondary | ICD-10-CM | POA: Diagnosis not present

## 2021-06-22 DIAGNOSIS — I509 Heart failure, unspecified: Secondary | ICD-10-CM | POA: Diagnosis not present

## 2021-06-22 DIAGNOSIS — E876 Hypokalemia: Secondary | ICD-10-CM | POA: Diagnosis not present

## 2021-06-22 DIAGNOSIS — I482 Chronic atrial fibrillation, unspecified: Secondary | ICD-10-CM | POA: Diagnosis not present

## 2021-06-22 LAB — CULTURE, BLOOD (ROUTINE X 2)
Culture: NO GROWTH
Culture: NO GROWTH
Special Requests: ADEQUATE
Special Requests: ADEQUATE

## 2021-06-23 DIAGNOSIS — I509 Heart failure, unspecified: Secondary | ICD-10-CM | POA: Diagnosis not present

## 2021-06-23 DIAGNOSIS — E876 Hypokalemia: Secondary | ICD-10-CM | POA: Diagnosis not present

## 2021-06-23 DIAGNOSIS — I482 Chronic atrial fibrillation, unspecified: Secondary | ICD-10-CM | POA: Diagnosis not present

## 2021-06-23 DIAGNOSIS — E785 Hyperlipidemia, unspecified: Secondary | ICD-10-CM | POA: Diagnosis not present

## 2021-06-30 DIAGNOSIS — I482 Chronic atrial fibrillation, unspecified: Secondary | ICD-10-CM | POA: Diagnosis not present

## 2021-06-30 DIAGNOSIS — E785 Hyperlipidemia, unspecified: Secondary | ICD-10-CM | POA: Diagnosis not present

## 2021-06-30 DIAGNOSIS — E876 Hypokalemia: Secondary | ICD-10-CM | POA: Diagnosis not present

## 2021-06-30 DIAGNOSIS — I509 Heart failure, unspecified: Secondary | ICD-10-CM | POA: Diagnosis not present

## 2021-07-03 DIAGNOSIS — E876 Hypokalemia: Secondary | ICD-10-CM | POA: Diagnosis not present

## 2021-07-03 DIAGNOSIS — I509 Heart failure, unspecified: Secondary | ICD-10-CM | POA: Diagnosis not present

## 2021-07-03 DIAGNOSIS — I482 Chronic atrial fibrillation, unspecified: Secondary | ICD-10-CM | POA: Diagnosis not present

## 2021-07-03 DIAGNOSIS — E785 Hyperlipidemia, unspecified: Secondary | ICD-10-CM | POA: Diagnosis not present

## 2021-07-04 DIAGNOSIS — I10 Essential (primary) hypertension: Secondary | ICD-10-CM | POA: Diagnosis not present

## 2021-07-11 DIAGNOSIS — I482 Chronic atrial fibrillation, unspecified: Secondary | ICD-10-CM | POA: Diagnosis not present

## 2021-07-11 DIAGNOSIS — E785 Hyperlipidemia, unspecified: Secondary | ICD-10-CM | POA: Diagnosis not present

## 2021-07-11 DIAGNOSIS — E876 Hypokalemia: Secondary | ICD-10-CM | POA: Diagnosis not present

## 2021-07-11 DIAGNOSIS — I509 Heart failure, unspecified: Secondary | ICD-10-CM | POA: Diagnosis not present

## 2021-07-17 DIAGNOSIS — I509 Heart failure, unspecified: Secondary | ICD-10-CM | POA: Diagnosis not present

## 2021-07-17 DIAGNOSIS — I482 Chronic atrial fibrillation, unspecified: Secondary | ICD-10-CM | POA: Diagnosis not present

## 2021-07-17 DIAGNOSIS — E876 Hypokalemia: Secondary | ICD-10-CM | POA: Diagnosis not present

## 2021-07-17 DIAGNOSIS — E785 Hyperlipidemia, unspecified: Secondary | ICD-10-CM | POA: Diagnosis not present

## 2021-07-22 DIAGNOSIS — E876 Hypokalemia: Secondary | ICD-10-CM | POA: Diagnosis not present

## 2021-07-22 DIAGNOSIS — E785 Hyperlipidemia, unspecified: Secondary | ICD-10-CM | POA: Diagnosis not present

## 2021-07-22 DIAGNOSIS — I482 Chronic atrial fibrillation, unspecified: Secondary | ICD-10-CM | POA: Diagnosis not present

## 2021-07-22 DIAGNOSIS — I509 Heart failure, unspecified: Secondary | ICD-10-CM | POA: Diagnosis not present

## 2021-07-28 DIAGNOSIS — E876 Hypokalemia: Secondary | ICD-10-CM | POA: Diagnosis not present

## 2021-07-28 DIAGNOSIS — I509 Heart failure, unspecified: Secondary | ICD-10-CM | POA: Diagnosis not present

## 2021-07-28 DIAGNOSIS — E785 Hyperlipidemia, unspecified: Secondary | ICD-10-CM | POA: Diagnosis not present

## 2021-07-28 DIAGNOSIS — I482 Chronic atrial fibrillation, unspecified: Secondary | ICD-10-CM | POA: Diagnosis not present

## 2021-07-31 DIAGNOSIS — E876 Hypokalemia: Secondary | ICD-10-CM | POA: Diagnosis not present

## 2021-07-31 DIAGNOSIS — E785 Hyperlipidemia, unspecified: Secondary | ICD-10-CM | POA: Diagnosis not present

## 2021-07-31 DIAGNOSIS — I482 Chronic atrial fibrillation, unspecified: Secondary | ICD-10-CM | POA: Diagnosis not present

## 2021-07-31 DIAGNOSIS — I509 Heart failure, unspecified: Secondary | ICD-10-CM | POA: Diagnosis not present

## 2021-08-04 DIAGNOSIS — I10 Essential (primary) hypertension: Secondary | ICD-10-CM | POA: Diagnosis not present

## 2021-08-05 DIAGNOSIS — I482 Chronic atrial fibrillation, unspecified: Secondary | ICD-10-CM | POA: Diagnosis not present

## 2021-08-05 DIAGNOSIS — I509 Heart failure, unspecified: Secondary | ICD-10-CM | POA: Diagnosis not present

## 2021-08-05 DIAGNOSIS — E876 Hypokalemia: Secondary | ICD-10-CM | POA: Diagnosis not present

## 2021-08-05 DIAGNOSIS — E785 Hyperlipidemia, unspecified: Secondary | ICD-10-CM | POA: Diagnosis not present

## 2021-08-07 DIAGNOSIS — I509 Heart failure, unspecified: Secondary | ICD-10-CM | POA: Diagnosis not present

## 2021-08-07 DIAGNOSIS — I482 Chronic atrial fibrillation, unspecified: Secondary | ICD-10-CM | POA: Diagnosis not present

## 2021-08-07 DIAGNOSIS — E785 Hyperlipidemia, unspecified: Secondary | ICD-10-CM | POA: Diagnosis not present

## 2021-08-07 DIAGNOSIS — E876 Hypokalemia: Secondary | ICD-10-CM | POA: Diagnosis not present

## 2021-08-11 DIAGNOSIS — E785 Hyperlipidemia, unspecified: Secondary | ICD-10-CM | POA: Diagnosis not present

## 2021-08-11 DIAGNOSIS — I509 Heart failure, unspecified: Secondary | ICD-10-CM | POA: Diagnosis not present

## 2021-08-11 DIAGNOSIS — E876 Hypokalemia: Secondary | ICD-10-CM | POA: Diagnosis not present

## 2021-08-11 DIAGNOSIS — I482 Chronic atrial fibrillation, unspecified: Secondary | ICD-10-CM | POA: Diagnosis not present

## 2021-08-14 DIAGNOSIS — I482 Chronic atrial fibrillation, unspecified: Secondary | ICD-10-CM | POA: Diagnosis not present

## 2021-08-14 DIAGNOSIS — E876 Hypokalemia: Secondary | ICD-10-CM | POA: Diagnosis not present

## 2021-08-14 DIAGNOSIS — I509 Heart failure, unspecified: Secondary | ICD-10-CM | POA: Diagnosis not present

## 2021-08-14 DIAGNOSIS — E785 Hyperlipidemia, unspecified: Secondary | ICD-10-CM | POA: Diagnosis not present

## 2021-08-19 ENCOUNTER — Ambulatory Visit: Payer: Medicare Other | Admitting: Urology

## 2021-08-19 DIAGNOSIS — E785 Hyperlipidemia, unspecified: Secondary | ICD-10-CM | POA: Diagnosis not present

## 2021-08-19 DIAGNOSIS — E876 Hypokalemia: Secondary | ICD-10-CM | POA: Diagnosis not present

## 2021-08-19 DIAGNOSIS — N390 Urinary tract infection, site not specified: Secondary | ICD-10-CM

## 2021-08-19 DIAGNOSIS — I509 Heart failure, unspecified: Secondary | ICD-10-CM | POA: Diagnosis not present

## 2021-08-19 DIAGNOSIS — I482 Chronic atrial fibrillation, unspecified: Secondary | ICD-10-CM | POA: Diagnosis not present

## 2021-08-21 DIAGNOSIS — I482 Chronic atrial fibrillation, unspecified: Secondary | ICD-10-CM | POA: Diagnosis not present

## 2021-08-21 DIAGNOSIS — I509 Heart failure, unspecified: Secondary | ICD-10-CM | POA: Diagnosis not present

## 2021-08-21 DIAGNOSIS — E785 Hyperlipidemia, unspecified: Secondary | ICD-10-CM | POA: Diagnosis not present

## 2021-08-21 DIAGNOSIS — E876 Hypokalemia: Secondary | ICD-10-CM | POA: Diagnosis not present

## 2021-08-26 DIAGNOSIS — E785 Hyperlipidemia, unspecified: Secondary | ICD-10-CM | POA: Diagnosis not present

## 2021-08-26 DIAGNOSIS — I509 Heart failure, unspecified: Secondary | ICD-10-CM | POA: Diagnosis not present

## 2021-08-26 DIAGNOSIS — E876 Hypokalemia: Secondary | ICD-10-CM | POA: Diagnosis not present

## 2021-08-26 DIAGNOSIS — I482 Chronic atrial fibrillation, unspecified: Secondary | ICD-10-CM | POA: Diagnosis not present

## 2021-08-28 DIAGNOSIS — E785 Hyperlipidemia, unspecified: Secondary | ICD-10-CM | POA: Diagnosis not present

## 2021-08-28 DIAGNOSIS — I509 Heart failure, unspecified: Secondary | ICD-10-CM | POA: Diagnosis not present

## 2021-08-28 DIAGNOSIS — E876 Hypokalemia: Secondary | ICD-10-CM | POA: Diagnosis not present

## 2021-08-28 DIAGNOSIS — I482 Chronic atrial fibrillation, unspecified: Secondary | ICD-10-CM | POA: Diagnosis not present

## 2021-08-30 DIAGNOSIS — E785 Hyperlipidemia, unspecified: Secondary | ICD-10-CM | POA: Diagnosis not present

## 2021-08-30 DIAGNOSIS — I482 Chronic atrial fibrillation, unspecified: Secondary | ICD-10-CM | POA: Diagnosis not present

## 2021-08-30 DIAGNOSIS — E876 Hypokalemia: Secondary | ICD-10-CM | POA: Diagnosis not present

## 2021-08-30 DIAGNOSIS — I509 Heart failure, unspecified: Secondary | ICD-10-CM | POA: Diagnosis not present

## 2021-09-02 DIAGNOSIS — E785 Hyperlipidemia, unspecified: Secondary | ICD-10-CM | POA: Diagnosis not present

## 2021-09-02 DIAGNOSIS — I509 Heart failure, unspecified: Secondary | ICD-10-CM | POA: Diagnosis not present

## 2021-09-02 DIAGNOSIS — E876 Hypokalemia: Secondary | ICD-10-CM | POA: Diagnosis not present

## 2021-09-02 DIAGNOSIS — I482 Chronic atrial fibrillation, unspecified: Secondary | ICD-10-CM | POA: Diagnosis not present

## 2021-09-03 DIAGNOSIS — I10 Essential (primary) hypertension: Secondary | ICD-10-CM | POA: Diagnosis not present

## 2021-09-04 DIAGNOSIS — E876 Hypokalemia: Secondary | ICD-10-CM | POA: Diagnosis not present

## 2021-09-04 DIAGNOSIS — E785 Hyperlipidemia, unspecified: Secondary | ICD-10-CM | POA: Diagnosis not present

## 2021-09-04 DIAGNOSIS — I509 Heart failure, unspecified: Secondary | ICD-10-CM | POA: Diagnosis not present

## 2021-09-04 DIAGNOSIS — I482 Chronic atrial fibrillation, unspecified: Secondary | ICD-10-CM | POA: Diagnosis not present

## 2021-09-05 DIAGNOSIS — E876 Hypokalemia: Secondary | ICD-10-CM | POA: Diagnosis not present

## 2021-09-05 DIAGNOSIS — I509 Heart failure, unspecified: Secondary | ICD-10-CM | POA: Diagnosis not present

## 2021-09-05 DIAGNOSIS — E785 Hyperlipidemia, unspecified: Secondary | ICD-10-CM | POA: Diagnosis not present

## 2021-09-05 DIAGNOSIS — I482 Chronic atrial fibrillation, unspecified: Secondary | ICD-10-CM | POA: Diagnosis not present

## 2021-09-30 DEATH — deceased
